# Patient Record
Sex: Male | Born: 1955
Health system: Southern US, Community
[De-identification: ages and names within clinical notes are randomized; demographics above are authoritative.]

## PROBLEM LIST (undated history)

## (undated) DIAGNOSIS — E119 Type 2 diabetes mellitus without complications: Secondary | ICD-10-CM

## (undated) DIAGNOSIS — I10 Essential (primary) hypertension: Secondary | ICD-10-CM

## (undated) DIAGNOSIS — I200 Unstable angina: Secondary | ICD-10-CM

## (undated) HISTORY — DX: Essential (primary) hypertension: I10

## (undated) HISTORY — DX: Unstable angina: I20.0

---

## 2012-10-08 ENCOUNTER — Encounter (HOSPITAL_BASED_OUTPATIENT_CLINIC_OR_DEPARTMENT_OTHER): Payer: Self-pay | Admitting: *Deleted

## 2012-10-08 ENCOUNTER — Emergency Department (HOSPITAL_BASED_OUTPATIENT_CLINIC_OR_DEPARTMENT_OTHER)
Admission: EM | Admit: 2012-10-08 | Discharge: 2012-10-08 | Disposition: A | Discharge status: Home / Self Care | Payer: Self-pay | Attending: Emergency Medicine | Admitting: Emergency Medicine

## 2012-10-08 DIAGNOSIS — R110 Nausea: Secondary | ICD-10-CM | POA: Insufficient documentation

## 2012-10-08 DIAGNOSIS — Z79899 Other long term (current) drug therapy: Secondary | ICD-10-CM | POA: Insufficient documentation

## 2012-10-08 DIAGNOSIS — IMO0002 Reserved for concepts with insufficient information to code with codable children: Secondary | ICD-10-CM

## 2012-10-08 DIAGNOSIS — E1065 Type 1 diabetes mellitus with hyperglycemia: Secondary | ICD-10-CM

## 2012-10-08 DIAGNOSIS — H9190 Unspecified hearing loss, unspecified ear: Secondary | ICD-10-CM | POA: Insufficient documentation

## 2012-10-08 DIAGNOSIS — H6093 Unspecified otitis externa, bilateral: Secondary | ICD-10-CM

## 2012-10-08 DIAGNOSIS — IMO0001 Reserved for inherently not codable concepts without codable children: Secondary | ICD-10-CM | POA: Insufficient documentation

## 2012-10-08 DIAGNOSIS — R51 Headache: Secondary | ICD-10-CM | POA: Insufficient documentation

## 2012-10-08 DIAGNOSIS — H60399 Other infective otitis externa, unspecified ear: Secondary | ICD-10-CM | POA: Insufficient documentation

## 2012-10-08 DIAGNOSIS — R42 Dizziness and giddiness: Secondary | ICD-10-CM | POA: Insufficient documentation

## 2012-10-08 DIAGNOSIS — F17200 Nicotine dependence, unspecified, uncomplicated: Secondary | ICD-10-CM | POA: Insufficient documentation

## 2012-10-08 LAB — BASIC METABOLIC PANEL
Calcium: 10.1 mg/dL (ref 8.4–10.5)
GFR calc non Af Amer: >90 mL/min (ref >90)
Glucose, Bld: 527 mg/dL — ABNORMAL HIGH (ref 70–99)
Sodium: 129 mEq/L — ABNORMAL LOW (ref 135–145)

## 2012-10-08 LAB — GLUCOSE, CAPILLARY

## 2012-10-08 MED ORDER — LEVOFLOXACIN 750 MG PO TABS: 750.0000 mg | ORAL_TABLET | Freq: Every day | ORAL | Status: DC

## 2012-10-08 MED ORDER — OXYCODONE-ACETAMINOPHEN 5-325 MG PO TABS
2.0000 | ORAL_TABLET | Freq: Once | ORAL | Status: AC
  Administered 2012-10-08: 2 via ORAL
  Filled 2012-10-08 (×2): qty 2

## 2012-10-08 MED ORDER — LEVOFLOXACIN IN D5W 750 MG/150ML IV SOLN
750.0000 mg | Freq: Once | INTRAVENOUS | Status: AC
  Administered 2012-10-08: 750 mg via INTRAVENOUS
  Filled 2012-10-08: qty 150

## 2012-10-08 MED ORDER — CIPROFLOXACIN-HYDROCORTISONE 0.2-1 % OT SUSP: 3.0000 [drp] | Freq: Two times a day (BID) | OTIC | Status: DC

## 2012-10-08 MED ORDER — METFORMIN HCL 500 MG PO TABS: 500.0000 mg | ORAL_TABLET | Freq: Two times a day (BID) | ORAL | Status: DC

## 2012-10-08 MED ORDER — INSULIN REGULAR HUMAN 100 UNIT/ML IJ SOLN
10.0000 [IU] | Freq: Once | INTRAMUSCULAR | Status: AC
  Administered 2012-10-08: 10 [IU] via SUBCUTANEOUS

## 2012-10-08 MED ORDER — INSULIN REGULAR HUMAN 100 UNIT/ML IJ SOLN
INTRAMUSCULAR | Status: AC
  Filled 2012-10-08: qty 1

## 2012-10-08 MED ORDER — OXYCODONE-ACETAMINOPHEN 5-325 MG PO TABS: 2.0000 | ORAL_TABLET | ORAL | Status: DC | PRN

## 2012-10-08 NOTE — ED Notes (Signed)
Was seen for ear pain last week in [place]. Was given abx drops, but there has been no improvement. States hearing loss in right ear last month and now lowered hearing loss in his left ear. Sore throat and dizziness. Patient talking louder than usual according to his wife.

## 2012-10-08 NOTE — ED Notes (Signed)
The patient's CBG was 496mg /dL

## 2012-10-08 NOTE — ED Provider Notes (Signed)
History    This chart was scribed for Rolan Bucco, MD, MD by Ashley Jacobs, ED Scribe. The patient was seen in room MH11/MH11 and the patient's care was started at 6:20 PM.   CSN: [id number] Arrival date & time 10/08/12  1658    Chief Complaint  Patient presents with  . Otalgia    The history is provided by the patient, medical records and the spouse. No language interpreter was used.   HPI Comments: Luis Larson is a 57 y.o. male who presents to the Emergency Department complaining moderate, constant ear pain bilaterally, with the left ear pain starting two months ago and right ear pain starting last week. Pt reports visiting a  ER due to left ear pain and was prescribed antibiotic drops (the drug did not relieve the pain). Pt is experiencing hearing loss, nausea, HA, pain on chewing Pt denies fever, drainage, rhinorrhea, chills, vomiting, diarrhea, weakness, cough, SOB and any other pain.  History reviewed. No pertinent past medical history. History reviewed. No pertinent past surgical history. No family history on file. History  Substance Use Topics  . Smoking status: Current Every Day Smoker  . Smokeless tobacco: Not on file  . Alcohol Use: No    Review of Systems  Constitutional: Negative for fever.  HENT: Positive for ear pain. Negative for congestion, rhinorrhea, sneezing and neck pain.   Eyes: Negative.   Respiratory: Negative.   Cardiovascular: Negative.   Gastrointestinal: Positive for nausea. Negative for vomiting, abdominal pain, diarrhea and blood in stool.  Genitourinary: Negative for frequency, hematuria, flank pain and difficulty urinating.  Musculoskeletal: Negative for back pain and arthralgias.  Skin: Negative for rash and wound.  Neurological: Positive for dizziness and headaches. Negative for speech difficulty and numbness.    Allergies  Review of patient's allergies indicates no known allergies.  Home Medications   Current Outpatient Rx  Name   Route  Sig  Dispense  Refill  . ciprofloxacin-dexamethasone (CIPRODEX) otic suspension   Both Ears   Place 4 drops into both ears 2 (two) times daily.         . ciprofloxacin-hydrocortisone (CIPRO HC) otic suspension   Both Ears   Place 3 drops into both ears 2 (two) times daily.   10 mL   0   . levofloxacin (LEVAQUIN) 750 MG tablet   Oral   Take 1 tablet (750 mg total) by mouth daily. X 7 days   7 tablet   0   . metFORMIN (GLUCOPHAGE) 500 MG tablet   Oral   Take 1 tablet (500 mg total) by mouth 2 (two) times daily with a meal.   60 tablet   0   . oxyCODONE-acetaminophen (PERCOCET) 5-325 MG per tablet   Oral   Take 2 tablets by mouth every 4 (four) hours as needed for pain.   20 tablet   0    BP 144/107  Pulse 112  Temp(Src) 98.7 F (37.1 C) (Oral)  Resp 20  SpO2 97% Physical Exam  Nursing note and vitals reviewed. Constitutional: He is oriented to person, place, and time. He appears well-developed and well-nourished.  HENT:  Head: Normocephalic and atraumatic.  Bilateral marked swelling of both ear canal White discharge from both ear canals Unable to visualize the TM No trismus  No significant pain over the mastoids  Eyes: Pupils are equal, round, and reactive to light.  Neck: Normal range of motion. Neck supple.  Cardiovascular: Normal rate, regular rhythm and normal heart sounds.  Pulmonary/Chest: Effort normal and breath sounds normal. No respiratory distress. He has no wheezes. He has no rales. He exhibits no tenderness.  Abdominal: Soft. Bowel sounds are normal. There is no tenderness. There is no rebound and no guarding.  Musculoskeletal: Normal range of motion. He exhibits no edema.  Lymphadenopathy:    He has no cervical adenopathy.  Neurological: He is alert and oriented to person, place, and time.  Skin: Skin is warm and dry. No rash noted.  Psychiatric: He has a normal mood and affect.    ED Course  Procedures (including critical care  time) DIAGNOSTIC STUDIES: Oxygen Saturation is 97% on roomair, normal by my interpretation.    COORDINATION OF CARE: 6:31 PM Discussed ED treatment with pt and pt agrees.   Results for orders placed during the hospital encounter of 10/08/12  BASIC METABOLIC PANEL      Result Value Range   Sodium 129 (*) 135 - 145 mEq/L   Potassium 4.2  3.5 - 5.1 mEq/L   Chloride 92 (*) 96 - 112 mEq/L   CO2 21  19 - 32 mEq/L   Glucose, Bld 527 (*) 70 - 99 mg/dL   BUN 13  6 - 23 mg/dL   Creatinine, Ser 1.61  0.50 - 1.35 mg/dL   Calcium 09.6  8.4 - 04.5 mg/dL   GFR calc non Af Amer >90  >90 mL/min   GFR calc Af Amer >90  >90 mL/min  GLUCOSE, CAPILLARY      Result Value Range   Glucose-Capillary 496 (*) 70 - 99 mg/dL   Comment 1 Notify RN     Comment 2 Documented in Chart     No results found.   No results found. 1. Otitis externa, bilateral   2. New onset type 1 diabetes mellitus, uncontrolled     MDM  Patient with new-onset diabetes and bilateral otitis externa. He was given a dose of subcutaneous insulin here in the ED. He was given dose of Levaquin in the ED. Ear wicks were placed in both of his years. His blood sugar is starting to come down. His creatinine is normal. He was started on metformin for new-onset diabetes. I advised him the importance of outpatient followup. He is currently living in [place] visiting here. I gave in the name of the community health and wellness Center to followup with if he remains in this area for more than a week or so. Otherwise he can followup with his primary care physician in [place]. I also stressed the importance of close ENT followup. He has swelling and infection of both of his ear canals. He was given a prescription for Cipro otic drops as well as oral Levaquin. He was given a prescription for Percocet for pain.  I personally performed the services described in this documentation, which was scribed in my presence.  The recorded information  has been reviewed and considered.    Rolan Bucco, MD 10/08/12 2134

## 2012-10-17 ENCOUNTER — Encounter: Payer: Self-pay | Admitting: Internal Medicine

## 2012-10-17 ENCOUNTER — Ambulatory Visit: Payer: Self-pay | Attending: Family Medicine | Admitting: Internal Medicine

## 2012-10-17 VITALS — BP 150/90 | HR 75 | Temp 98.9°F | Resp 18 | Ht 70.0 in | Wt 299.0 lb

## 2012-10-17 DIAGNOSIS — H6690 Otitis media, unspecified, unspecified ear: Secondary | ICD-10-CM

## 2012-10-17 DIAGNOSIS — IMO0001 Reserved for inherently not codable concepts without codable children: Secondary | ICD-10-CM

## 2012-10-17 DIAGNOSIS — E119 Type 2 diabetes mellitus without complications: Secondary | ICD-10-CM

## 2012-10-17 HISTORY — DX: Reserved for inherently not codable concepts without codable children: IMO0001

## 2012-10-17 LAB — LIPID PANEL
Cholesterol: 152 mg/dL (ref 0–200)
Total CHOL/HDL Ratio: 4.3 Ratio
Triglycerides: 188 mg/dL — ABNORMAL HIGH (ref <150)
VLDL: 38 mg/dL (ref 0–40)

## 2012-10-17 MED ORDER — METFORMIN HCL 500 MG PO TABS: 500.0000 mg | ORAL_TABLET | Freq: Two times a day (BID) | ORAL | Status: DC

## 2012-10-17 NOTE — Progress Notes (Signed)
Patient states he was in [facility] ED at 70 one week ago for ear ache; was told his blood sugar was 527 and that he needed to follow up here. States he is still having pain in ears.

## 2012-10-17 NOTE — Progress Notes (Signed)
Patient ID: Luis Larson, male   DOB: [birth year], 57 y.o.   MRN: [id number]  CC: New patient  HPI: 57 year old male with no significant past medical history, recent ear infection and new onset diabetes who comes in for followup. Patient reports feeling nausea occasionally in the morning. He has been checking his blood sugars every morning and usually it is around 300. He does admit to being noncompliant with dietary regimens and is has been difficult change for him knowing he has diabetes. Patient reports no blurry vision or headaches. No polydipsia or polyphagia.  No Known Allergies History reviewed. No pertinent past medical history. Current Outpatient Prescriptions on File Prior to Visit  Medication Sig Dispense Refill  . ciprofloxacin-hydrocortisone (CIPRO HC) otic suspension Place 3 drops into both ears 2 (two) times daily.  10 mL  0  . ciprofloxacin-dexamethasone (CIPRODEX) otic suspension Place 4 drops into both ears 2 (two) times daily.      Marland Kitchen levofloxacin (LEVAQUIN) 750 MG tablet Take 1 tablet (750 mg total) by mouth daily. X 7 days  7 tablet  0  . oxyCODONE-acetaminophen (PERCOCET) 5-325 MG per tablet Take 2 tablets by mouth every 4 (four) hours as needed for pain.  20 tablet  0   No current facility-administered medications on file prior to visit.   Family History  Problem Relation Age of Onset  . Diabetes Mother   . Heart disease Mother   . Heart disease Father    History   Social History  . Marital Status: Married    Spouse Name: N/A    Number of Children: N/A  . Years of Education: N/A   Occupational History  . Not on file.   Social History Main Topics  . Smoking status: Current Every Day Smoker  . Smokeless tobacco: Not on file  . Alcohol Use: No  . Drug Use: Not on file  . Sexually Active: Not on file   Other Topics Concern  . Not on file   Social History Narrative  . No narrative on file    Review of Systems  Constitutional: Negative for fever, chills,  diaphoresis, activity change, appetite change and fatigue.  HENT: Negative for ear pain, nosebleeds, congestion, facial swelling, rhinorrhea, neck pain, neck stiffness and ear discharge.   Eyes: Negative for pain, discharge, redness, itching and visual disturbance.  Respiratory: Negative for cough, choking, chest tightness, shortness of breath, wheezing and stridor.   Cardiovascular: Negative for chest pain, palpitations and leg swelling.  Gastrointestinal: Negative for abdominal distention.  Genitourinary: Negative for dysuria, urgency, frequency, hematuria, flank pain, decreased urine volume, difficulty urinating and dyspareunia.  Musculoskeletal: Negative for back pain, joint swelling, arthralgias and gait problem.  Neurological: Negative for dizziness, tremors, seizures, syncope, facial asymmetry, speech difficulty, weakness, light-headedness, numbness and headaches.  Hematological: Negative for adenopathy. Does not bruise/bleed easily.  Psychiatric/Behavioral: Negative for hallucinations, behavioral problems, confusion, dysphoric mood, decreased concentration and agitation.    Objective:   Filed Vitals:   10/17/12 1659  BP: 150/90  Pulse: 75  Temp: 98.9 F (37.2 C)  Resp: 18    Physical Exam  Constitutional: Appears well-developed and well-nourished. No distress.  HENT: Normocephalic. External right and left ear normal. Oropharynx is clear and moist.  Eyes: Conjunctivae and EOM are normal. PERRLA, no scleral icterus.  Neck: Normal ROM. Neck supple. No JVD. No tracheal deviation. No thyromegaly.  CVS: RRR, S1/S2 +, no murmurs, no gallops, no carotid bruit.  Pulmonary: Effort and breath sounds normal, no stridor,  rhonchi, wheezes, rales.  Abdominal: Soft. BS +,  no distension, tenderness, rebound or guarding.  Musculoskeletal: Normal range of motion. No edema and no tenderness.  Lymphadenopathy: No lymphadenopathy noted, cervical, inguinal. Neuro: Alert. Normal reflexes, muscle  tone coordination. No cranial nerve deficit. Skin: Skin is warm and dry. No rash noted. Not diaphoretic. No erythema. No pallor.  Psychiatric: Normal mood and affect. Behavior, judgment, thought content normal.   No results found for this basename: WBC, HGB, HCT, MCV, PLT   Lab Results  Component Value Date   CREATININE 0.80 10/08/2012   BUN 13 10/08/2012   NA 129* 10/08/2012   K 4.2 10/08/2012   CL 92* 10/08/2012   CO2 21 10/08/2012    No results found for this basename: HGBA1C   Lipid Panel  No results found for this basename: chol, trig, hdl, cholhdl, vldl, ldlcalc       Assessment and plan:   Patient Active Problem List   Diagnosis Date Noted  . Diabetes - check A1c today - Patient will move to [place] in 4 days. He will establish primary care physician there.  - Continue metformin 500 mg twice a day  10/17/2012  . Ear infection - stable 10/17/2012

## 2012-10-17 NOTE — Patient Instructions (Addendum)
Blood Sugar Monitoring, Adult  GLUCOSE METERS FOR SELF-MONITORING OF BLOOD GLUCOSE   It is important to be able to correctly measure your blood sugar (glucose). You can use a blood glucose monitor (a small battery-operated device) to check your glucose level at any time. This allows you and your caregiver to monitor your diabetes and to determine how well your treatment plan is working. The process of monitoring your blood glucose with a glucose meter is called self-monitoring of blood glucose (SMBG). When people with diabetes control their blood sugar, they have better health.  To test for glucose with a typical glucose meter, place the disposable strip in the meter. Then place a small sample of blood on the "test strip." The test strip is coated with chemicals that combine with glucose in blood. The meter measures how much glucose is present. The meter displays the glucose level as a number. Several new models can record and store a number of test results. Some models can connect to personal computers to store test results or print them out.   Newer meters are often easier to use than older models. Some meters allow you to get blood from places other than your fingertip. Some new models have automatic timing, error codes, signals, or barcode readers to help with proper adjustment (calibration). Some meters have a large display screen or spoken instructions for people with visual impairments.   INSTRUCTIONS FOR USING GLUCOSE METERS   Wash your hands with soap and warm water, or clean the area with alcohol. Dry your hands completely.   Prick the side of your fingertip with a lancet (a sharp-pointed tool used by hand).   Hold the hand down and gently milk the finger until a small drop of blood appears. Catch the blood with the test strip.    Follow the instructions for inserting the test strip and using the SMBG meter. Most meters require the meter to be turned on and the test strip to be inserted before applying the blood sample.   Record the test result.   Read the instructions carefully for both the meter and the test strips that go with it. Meter instructions are found in the user manual. Keep this manual to help you solve any problems that may arise. Many meters use "error codes" when there is a problem with the meter, the test strip, or the blood sample on the strip. You will need the manual to understand these error codes and fix the problem.   New devices are available such as laser lancets and meters that can test blood taken from "alternative sites" of the body, other than fingertips. However, you should use standard fingertip testing if your glucose changes rapidly. Also, use standard testing if:   You have eaten, exercised, or taken insulin in the past 2 hours.   You think your glucose is low.   You tend to not feel symptoms of low blood glucose (hypoglycemia).   You are ill or under stress.   Clean the meter as directed by the manufacturer.   Test the meter for accuracy as directed by the manufacturer.   Take your meter with you to your caregiver's office. This way, you can test your glucose in front of your caregiver to make sure you are using the meter correctly. Your caregiver can also take a sample of blood to test using a routine lab method. If values on the glucose meter are close to the lab results, you and your caregiver will see   that your meter is working well and you are using good technique. Your caregiver will advise you about what to do if the results do not match.  FREQUENCY OF TESTING    Your caregiver will tell you how often you should check your blood glucose. This will depend on your type of diabetes, your current level of diabetes control, and your types of medicines. The following are general guidelines, but your care plan may be different. Record all your readings and the time of day you took them for review with your caregiver.    Diabetes type 1.   When you are using insulin with good diabetic control (either multiple daily injections or via a pump), you should check your glucose 4 times a day.   If your diabetes is not well controlled, you may need to monitor more frequently, including before meals and 2 hours after meals, at bedtime, and occasionally between 2 a.m. and 3 a.m.   You should always check your glucose before a dose of insulin or before changing the rate on your insulin pump.   Diabetes type 2.   Guidelines for SMBG in diabetes type 2 are not as well defined.   If you are on insulin, follow the guidelines above.   If you are on medicines, but not insulin, and your glucose is not well controlled, you should test at least twice daily.   If you are not on insulin, and your diabetes is controlled with medicines or diet alone, you should test at least once daily, usually before breakfast.   A weekly profile will help your caregiver advise you on your care plan. The week before your visit, check your glucose before a meal and 2 hours after a meal at least daily. You may want to test before and after a different meal each day so you and your caregiver can tell how well controlled your blood sugars are throughout the course of a 24 hour period.   Gestational diabetes (diabetes during pregnancy).   Frequent testing is often necessary. Accurate timing is important.   If you are not on insulin, check your glucose 4 times a day. Check it before breakfast and 1 hour after the start of each meal.    If you are on insulin, check your glucose 6 times a day. Check it before each meal and 1 hour after the first bite of each meal.   General guidelines.   More frequent testing is required at the start of insulin treatment. Your caregiver will instruct you.   Test your glucose any time you suspect you have low blood sugar (hypoglycemia).   You should test more often when you change medicines, when you have unusual stress or illness, or in other unusual circumstances.  OTHER THINGS TO KNOW ABOUT GLUCOSE METERS   Measurement Range. Most glucose meters are able to read glucose levels over a broad range of values from as low as 0 to as high as 600 mg/dL. If you get an extremely high or low reading from your meter, you should first confirm it with another reading. Report very high or very low readings to your caregiver.   Whole Blood Glucose versus Plasma Glucose. Some older home glucose meters measure glucose in your whole blood. In a lab or when using some newer home glucose meters, the glucose is measured in your plasma (one component of blood). The difference can be important. It is important for you and your caregiver to know whether your meter   gives its results as "whole blood equivalent" or "plasma equivalent."   Display of High and Low Glucose Values. Part of learning how to operate a meter is understanding what the meter results mean. Know how high and low glucose concentrations are displayed on your meter.   Factors that Affect Glucose Meter Performance. The accuracy of your test results depends on many factors and varies depending on the brand and type of meter. These factors include:   Low red blood cell count (anemia).   Substances in your blood (such as uric acid, vitamin C, and others).   Environmental factors (temperature, humidity, altitude).   Name-brand versus generic test strips.    Calibration. Make sure your meter is set up properly. It is a good idea to do a calibration test with a control solution recommended by the manufacturer of your meter whenever you begin using a fresh bottle of test strips. This will help verify the accuracy of your meter.   Improperly stored, expired, or defective test strips. Keep your strips in a dry place with the lid on.   Soiled meter.   Inadequate blood sample.  NEW TECHNOLOGIES FOR GLUCOSE TESTING  Alternative site testing  Some glucose meters allow testing blood from alternative sites. These include the:   Upper arm.   Forearm.   Base of the thumb.   Thigh.  Sampling blood from alternative sites may be desirable. However, it may have some limitations. Blood in the fingertips show changes in glucose levels more quickly than blood in other parts of the body. This means that alternative site test results may be different from fingertip test results, not because of the meter's ability to test accurately, but because the actual glucose concentration can be different.   Continuous Glucose Monitoring  Devices to measure your blood glucose continuously are available, and others are in development. These methods can be more expensive than self-monitoring with a glucose meter. However, it is uncertain how effective and reliable these devices are. Your caregiver will advise you if this approach makes sense for you.  IF BLOOD SUGARS ARE CONTROLLED, PEOPLE WITH DIABETES REMAIN HEALTHIER.   SMBG is an important part of the treatment plan of patients with diabetes mellitus. Below are reasons for using SMBG:    It confirms that your glucose is at a specific, healthy level.   It detects hypoglycemia and severe hyperglycemia.   It allows you and your caregiver to make adjustments in response to changes in lifestyle for individuals requiring medicine.    It determines the need for starting insulin therapy in temporary diabetes that happens during pregnancy (gestational diabetes).  Document Released: 04/01/2003 Document Revised: 06/21/2011 Document Reviewed: 07/23/2010  [employer] Patient Information 2014 [employer].

## 2012-10-25 ENCOUNTER — Telehealth: Payer: Self-pay | Admitting: Family Medicine

## 2012-10-25 NOTE — Telephone Encounter (Signed)
Pt A1C is 12.4; pt needs explanation of lipid panel and would like a call back

## 2012-10-26 ENCOUNTER — Telehealth: Payer: Self-pay | Admitting: Family Medicine

## 2012-10-26 NOTE — Telephone Encounter (Signed)
Pt's sister calling about lab results, due to high A1C. Pt's sister spoke with Angelique Blonder.

## 2012-10-27 ENCOUNTER — Ambulatory Visit: Payer: Self-pay | Attending: Family Medicine | Admitting: Family Medicine

## 2012-10-27 VITALS — BP 146/89 | HR 63 | Temp 98.5°F | Resp 17 | Wt 297.2 lb

## 2012-10-27 DIAGNOSIS — IMO0001 Reserved for inherently not codable concepts without codable children: Secondary | ICD-10-CM

## 2012-10-27 MED ORDER — LISINOPRIL-HYDROCHLOROTHIAZIDE 10-12.5 MG PO TABS: 1.0000 | ORAL_TABLET | Freq: Every day | ORAL | Status: DC

## 2012-10-27 MED ORDER — METFORMIN HCL ER 500 MG PO TB24: 1000.0000 mg | ORAL_TABLET | Freq: Two times a day (BID) | ORAL | Status: DC

## 2012-10-27 NOTE — Patient Instructions (Addendum)
Diabetes and Exercise Regular exercise is important and can help:   Control blood glucose (sugar).  Decrease blood pressure.    Control blood lipids (cholesterol, triglycerides).  Improve overall health. BENEFITS FROM EXERCISE  Improved fitness.  Improved flexibility.  Improved endurance.  Increased bone density.  Weight control.  Increased muscle strength.  Decreased body fat.  Improvement of the body's use of insulin, a hormone.  Increased insulin sensitivity.  Reduction of insulin needs.  Reduced stress and tension.  Helps you feel better. People with diabetes who add exercise to their lifestyle gain additional benefits, including:  Weight loss.  Reduced appetite.  Improvement of the body's use of blood glucose.  Decreased risk factors for heart disease:  Lowering of cholesterol and triglycerides.  Raising the level of good cholesterol (high-density lipoproteins, HDL).  Lowering blood sugar.  Decreased blood pressure. TYPE 1 DIABETES AND EXERCISE  Exercise will usually lower your blood glucose.  If blood glucose is greater than 240 mg/dl, check urine ketones. If ketones are present, do not exercise.  Location of the insulin injection sites may need to be adjusted with exercise. Avoid injecting insulin into areas of the body that will be exercised. For example, avoid injecting insulin into:  The arms when playing tennis.  The legs when jogging. For more information, discuss this with your caregiver.  Keep a record of:  Food intake.  Type and amount of exercise.  Expected peak times of insulin action.  Blood glucose levels. Do this before, during, and after exercise. Review your records with your caregiver. This will help you to develop guidelines for adjusting food intake and insulin amounts.  TYPE 2 DIABETES AND EXERCISE  Regular physical activity can help control blood glucose.  Exercise is important because it may:  Increase the  body's sensitivity to insulin.  Improve blood glucose control.  Exercise reduces the risk of heart disease. It decreases serum cholesterol and triglycerides. It also lowers blood pressure.  Those who take insulin or oral hypoglycemic agents should watch for signs of hypoglycemia. These signs include dizziness, shaking, sweating, chills, and confusion.  Body water is lost during exercise. It must be replaced. This will help to avoid loss of body fluids (dehydration) or heat stroke. Be sure to talk to your caregiver before starting an exercise program to make sure it is safe for you. Remember, any activity is better than none.  Document Released: 06/19/2003 Document Revised: 06/21/2011 Document Reviewed: 10/03/2008 [facility] Patient Information 2014 [place], [place]. Diabetes and Foot Care Diabetes may cause you to have a poor blood supply (circulation) to your legs and feet. Because of this, the skin may be thinner, break easier, and heal more slowly. You also may have nerve damage in your legs and feet causing decreased feeling. You may not notice minor injuries to your feet that could lead to serious problems or infections. Taking care of your feet is one of the most important things you can do for yourself.  HOME CARE INSTRUCTIONS  Do not go barefoot. Bare feet are easily injured.  Check your feet daily for blisters, cuts, and redness.  Wash your feet with warm water (not hot) and mild soap. Pat your feet and between your toes until completely dry.  Apply a moisturizing lotion that does not contain alcohol or petroleum jelly to the dry skin on your feet and to dry brittle toenails. Do not put it between your toes.  Trim your toenails straight across. Do not dig under them or around  the cuticle.  Do not cut corns or calluses, or try to remove them with medicine.  Wear clean cotton socks or stockings every day. Make sure they are not too tight. Do not wear knee high stockings since they may  decrease blood flow to your legs.  Wear leather shoes that fit properly and have enough cushioning. To break in new shoes, wear them just a few hours a day to avoid injuring your feet.  Wear shoes at all times, even in the house.  Do not cross your legs. This may decrease the blood flow to your feet.  If you find a minor scrape, cut, or break in the skin on your feet, keep it and the skin around it clean and dry. These areas may be cleansed with mild soap and water. Do not use peroxide, alcohol, iodine or Merthiolate.  When you remove an adhesive bandage, be sure not to harm the skin around it.  If you have a wound, look at it several times a day to make sure it is healing.  Do not use heating pads or hot water bottles. Burns can occur. If you have lost feeling in your feet or legs, you may not know it is happening until it is too late.  Report any cuts, sores or bruises to your caregiver. Do not wait! SEEK MEDICAL CARE IF:   You have an injury that is not healing or you notice redness, numbness, burning, or tingling.  Your feet always feel cold.  You have pain or cramps in your legs and feet. SEEK IMMEDIATE MEDICAL CARE IF:   There is increasing redness, swelling, or increasing pain in the wound.  There is a red line that goes up your leg.  Pus is coming from a wound.  You develop an unexplained oral temperature above 102 F (38.9 C), or as your caregiver suggests.  You notice a bad smell coming from an ulcer or wound. MAKE SURE YOU:   Understand these instructions.  Will watch your condition.  Will get help right away if you are not doing well or get worse. Document Released: 03/26/2000 Document Revised: 06/21/2011 Document Reviewed: 10/02/2008 [employer] Patient Information 2014 [place], [place]. 1800 Calorie Diet for Diabetes Meal Planning The 1800 calorie diet is designed for eating up to 1800 calories each day. Following this diet and making healthy meal choices can  help improve overall health. This diet controls blood sugar (glucose) levels and can also help lower blood pressure and cholesterol. SERVING SIZES Measuring foods and serving sizes helps to make sure you are getting the right amount of food. The list below tells how big or small some common serving sizes are:  1 oz.........4 stacked dice.  3 oz........Marland KitchenDeck of cards.  1 tsp.......Marland KitchenTip of little finger.  1 tbs......Marland KitchenMarland KitchenThumb.  2 tbs.......Marland KitchenGolf ball.   cup......Marland KitchenHalf of a fist.  1 cup.......Marland KitchenA fist. GUIDELINES FOR CHOOSING FOODS The goal of this diet is to eat a variety of foods and limit calories to 1800 each day. This can be done by choosing foods that are low in calories and fat. The diet also suggests eating small amounts of food frequently. Doing this helps control your blood glucose levels so they do not get too high or too low. Each meal or snack may include a protein food source to help you feel more satisfied and to stabilize your blood glucose. Try to eat about the same amount of food around the same time each day. This includes weekend days, travel days,  and days off work. Space your meals about 4 to 5 hours apart and add a snack between them if you wish.  For example, a daily food plan could include breakfast, a morning snack, lunch, dinner, and an evening snack. Healthy meals and snacks include whole grains, vegetables, fruits, lean meats, poultry, fish, and dairy products. As you plan your meals, select a variety of foods. Choose from the bread and starch, vegetable, fruit, dairy, and meat/protein groups. Examples of foods from each group and their suggested serving sizes are listed below. Use measuring cups and spoons to become familiar with what a healthy portion looks like. Bread and Starch Each serving equals 15 grams of carbohydrates.  1 slice bread.   bagel.   cup cold cereal (unsweetened).   cup hot cereal or mashed potatoes.  1 small potato (size of a computer  mouse).   cup cooked pasta or rice.   English muffin.  1 cup broth-based soup.  3 cups of popcorn.  4 to 6 whole-wheat crackers.   cup cooked beans, peas, or corn. Vegetable Each serving equals 5 grams of carbohydrates.   cup cooked vegetables.  1 cup raw vegetables.   cup tomato or vegetable juice. Fruit Each serving equals 15 grams of carbohydrates.  1 small apple or orange.  1 cup watermelon or strawberries.   cup applesauce (no sugar added).  2 tbs raisins.   banana.   cup canned fruit, packed in water, its own juice, or sweetened with a sugar substitute.   cup unsweetened fruit juice. Dairy Each serving equals 12 to 15 grams of carbohydrates.  1 cup fat-free milk.  6 oz artificially sweetened yogurt or plain yogurt.  1 cup low-fat buttermilk.  1 cup soy milk.  1 cup almond milk. Meat/Protein  1 large egg.  2 to 3 oz meat, poultry, or fish.   cup low-fat cottage cheese.  1 tbs peanut butter.  1 oz low-fat cheese.   cup tuna in water.   cup tofu. Fat  1 tsp oil.  1 tsp trans-fat-free margarine.  1 tsp butter.  1 tsp mayonnaise.  2 tbs avocado.  1 tbs salad dressing.  1 tbs cream cheese.  2 tbs sour cream. SAMPLE 1800 CALORIE DIET PLAN Breakfast   cup unsweetened cereal (1 carb serving).  1 cup fat-free milk (1 carb serving).  1 slice whole-wheat toast (1 carb serving).   small banana (1 carb serving).  1 scrambled egg.  1 tsp trans-fat-free margarine. Lunch  Tuna sandwich.  2 slices whole-wheat bread (2 carb servings).   cup canned tuna in water, drained.  1 tbs reduced fat mayonnaise.  1 stalk celery, chopped.  2 slices tomato.  1 lettuce leaf.  1 cup carrot sticks.  24 to 30 seedless grapes (2 carb servings).  6 oz light yogurt (1 carb serving). Afternoon Snack  3 graham cracker squares (1 carb serving).  Fat-free milk, 1 cup (1 carb serving).  1 tbs peanut  butter. Dinner  3 oz salmon, broiled with 1 tsp oil.  1 cup mashed potatoes (2 carb servings) with 1 tsp trans-fat-free margarine.  1 cup fresh or frozen green beans.  1 cup steamed asparagus.  1 cup fat-free milk (1 carb serving). Evening Snack  3 cups air-popped popcorn (1 carb serving).  2 tbs parmesan cheese sprinkled on top. MEAL PLAN Use this worksheet to help you make a daily meal plan based on the 1800 calorie diet suggestions. If you are using this  plan to help you control your blood glucose, you may interchange carbohydrate-containing foods (dairy, starches, and fruits). Select a variety of fresh foods of varying colors and flavors. The total amount of carbohydrate in your meals or snacks is more important than making sure you include all of the food groups every time you eat. Choose from the following foods to build your day's meals:  8 Starches.  4 Vegetables.  3 Fruits.  2 Dairy.  6 to 7 oz Meat/Protein.  Up to 4 Fats. Your dietician can use this worksheet to help you decide how many servings and which types of foods are right for you. BREAKFAST Food Group and Servings / Food Choice Starch ________________________________________________________ Dairy _________________________________________________________ Fruit _________________________________________________________ Meat/Protein __________________________________________________ Fat ___________________________________________________________ LUNCH Food Group and Servings / Food Choice Starch ________________________________________________________ Meat/Protein __________________________________________________ Vegetable _____________________________________________________ Fruit _________________________________________________________ Dairy _________________________________________________________ Fat ___________________________________________________________ Luis Larson Food Group and Servings  / Food Choice Starch ________________________________________________________ Meat/Protein __________________________________________________ Fruit __________________________________________________________ Dairy _________________________________________________________ Luis Larson Food Group and Servings / Food Choice Starch _________________________________________________________ Meat/Protein ___________________________________________________ Dairy __________________________________________________________ Vegetable ______________________________________________________ Fruit ___________________________________________________________ Fat ____________________________________________________________ Luis Larson Food Group and Servings / Food Choice Fruit __________________________________________________________ Meat/Protein ___________________________________________________ Dairy __________________________________________________________ Starch _________________________________________________________ DAILY TOTALS Starch ____________________________ Vegetable _________________________ Fruit _____________________________ Dairy _____________________________ Meat/Protein______________________ Fat _______________________________ Document Released: 10/19/2004 Document Revised: 06/21/2011 Document Reviewed: 02/12/2011 [employer] Patient Information 2014 [place].  Smoking Cessation, Tips for Success YOU CAN QUIT SMOKING If you are ready to quit smoking, congratulations! You have chosen to help yourself be healthier. Cigarettes bring nicotine, tar, carbon monoxide, and other irritants into your body. Your lungs, heart, and blood vessels will be able to work better without these poisons. There are many different ways to quit smoking. Nicotine gum, nicotine patches, a nicotine inhaler, or nicotine nasal spray can help with physical craving. Hypnosis, support groups, and medicines help  break the habit of smoking. Here are some tips to help you quit for good.  Throw away all cigarettes.  Clean and remove all ashtrays from your home, work, and car.  On a card, write down your reasons for quitting. Carry the card with you and read it when you get the urge to smoke.  Cleanse your body of nicotine. Drink enough water and fluids to keep your urine clear or pale yellow. Do this after quitting to flush the nicotine from your body.  Learn to predict your moods. Do not let a bad situation be your excuse to have a cigarette. Some situations in your life might tempt you into wanting a cigarette.  Never have "just one" cigarette. It leads to wanting another and another. Remind yourself of your decision to quit.  Change habits associated with smoking. If you smoked while driving or when feeling stressed, try other activities to replace smoking. Stand up when drinking your coffee. Brush your teeth after eating. Sit in a different chair when you read the paper. Avoid alcohol while trying to quit, and try to drink fewer caffeinated beverages. Alcohol and caffeine may urge you to smoke.  Avoid foods and drinks that can trigger a desire to smoke, such as sugary or spicy foods and alcohol.  Ask people who smoke not to smoke around you.  Have something planned to do right after eating or having a cup of coffee. Take a walk or exercise to perk you up. This will help to keep you from overeating.  Try a relaxation exercise to calm you down and decrease your stress. Remember, you may be tense and nervous for the  first 2 weeks after you quit, but this will pass.  Find new activities to keep your hands busy. Play with a pen, coin, or rubber band. Doodle or draw things on paper.  Brush your teeth right after eating. This will help cut down on the craving for the taste of tobacco after meals. You can try mouthwash, too.  Use oral substitutes, such as lemon drops, carrots, a cinnamon stick, or  chewing gum, in place of cigarettes. Keep them handy so they are available when you have the urge to smoke.  When you have the urge to smoke, try deep breathing.  Designate your home as a nonsmoking area.  If you are a heavy smoker, ask your caregiver about a prescription for nicotine chewing gum. It can ease your withdrawal from nicotine.  Reward yourself. Set aside the cigarette money you save and buy yourself something nice.  Look for support from others. Join a support group or smoking cessation program. Ask someone at home or at work to help you with your plan to quit smoking.  Always ask yourself, "Do I need this cigarette or is this just a reflex?" Tell yourself, "Today, I choose not to smoke," or "I do not want to smoke." You are reminding yourself of your decision to quit, even if you do smoke a cigarette. HOW WILL I FEEL WHEN I QUIT SMOKING?  The benefits of not smoking start within days of quitting.  You may have symptoms of withdrawal because your body is used to nicotine (the addictive substance in cigarettes). You may crave cigarettes, be irritable, feel very hungry, cough often, get headaches, or have difficulty concentrating.  The withdrawal symptoms are only temporary. They are strongest when you first quit but will go away within 10 to 14 days.  When withdrawal symptoms occur, stay in control. Think about your reasons for quitting. Remind yourself that these are signs that your body is healing and getting used to being without cigarettes.  Remember that withdrawal symptoms are easier to treat than the major diseases that smoking can cause.  Even after the withdrawal is over, expect periodic urges to smoke. However, these cravings are generally short-lived and will go away whether you smoke or not. Do not smoke!  If you relapse and smoke again, do not lose hope. Most smokers quit 3 times before they are successful.  If you relapse, do not give up! Plan ahead and think  about what you will do the next time you get the urge to smoke. LIFE AS A NONSMOKER: MAKE IT FOR A MONTH, MAKE IT FOR LIFE Day 1: Hang this page where you will see it every day. Day 2: Get rid of all ashtrays, matches, and lighters. Day 3: Drink water. Breathe deeply between sips. Day 4: Avoid places with smoke-filled air, such as bars, clubs, or the smoking section of restaurants. Day 5: Keep track of how much money you save by not smoking. Day 6: Avoid boredom. Keep a good book with you or go to the movies. Day 7: Reward yourself! One week without smoking! Day 8: Make a dental appointment to get your teeth cleaned. Day 9: Decide how you will turn down a cigarette before it is offered to you. Day 10: Review your reasons for quitting. Day 11: Distract yourself. Stay active to keep your mind off smoking and to relieve tension. Take a walk, exercise, read a book, do a crossword puzzle, or try a new hobby. Day 12: Exercise. Get off the  bus before your stop or use stairs instead of escalators. Day 13: Call on friends for support and encouragement. Day 14: Reward yourself! Two weeks without smoking! Day 15: Practice deep breathing exercises. Day 16: Bet a friend that you can stay a nonsmoker. Day 17: Ask to sit in nonsmoking sections of restaurants. Day 18: Hang up "No Smoking" signs. Day 19: Think of yourself as a nonsmoker. Day 20: Each morning, tell yourself you will not smoke. Day 21: Reward yourself! Three weeks without smoking! Day 22: Think of smoking in negative ways. Remember how it stains your teeth, gives you bad breath, and leaves you short of breath. Day 23: Eat a nutritious breakfast. Day 24:Do not relive your days as a smoker. Day 25: Hold a pencil in your hand when talking on the telephone. Day 26: Tell all your friends you do not smoke. Day 27: Think about how much better food tastes. Day 28: Remember, one cigarette is one too many. Day 29: Take up a hobby that will keep  your hands busy. Day 30: Congratulations! One month without smoking! Give yourself a big reward. Your caregiver can direct you to community resources or hospitals for support, which may include:  Group support.  Education.  Hypnosis.  Subliminal therapy. Document Released: 12/26/2003 Document Revised: 06/21/2011 Document Reviewed: 01/13/2009 [facility] Patient Information 2014 [place], [place]. Smoking Cessation Quitting smoking is important to your health and has many advantages. However, it is not always easy to quit since nicotine is a very addictive drug. Often times, people try 3 times or more before being able to quit. This document explains the best ways for you to prepare to quit smoking. Quitting takes hard work and a lot of effort, but you can do it. ADVANTAGES OF QUITTING SMOKING  You will live longer, feel better, and live better.  Your body will feel the impact of quitting smoking almost immediately.  Within 20 minutes, blood pressure decreases. Your pulse returns to its normal level.  After 8 hours, carbon monoxide levels in the blood return to normal. Your oxygen level increases.  After 24 hours, the chance of having a heart attack starts to decrease. Your breath, hair, and body stop smelling like smoke.  After 48 hours, damaged nerve endings begin to recover. Your sense of taste and smell improve.  After 72 hours, the body is virtually free of nicotine. Your bronchial tubes relax and breathing becomes easier.  After 2 to 12 weeks, lungs can hold more air. Exercise becomes easier and circulation improves.  The risk of having a heart attack, stroke, cancer, or lung disease is greatly reduced.  After 1 year, the risk of coronary heart disease is cut in half.  After 5 years, the risk of stroke falls to the same as a nonsmoker.  After 10 years, the risk of lung cancer is cut in half and the risk of other cancers decreases significantly.  After 15 years, the risk of  coronary heart disease drops, usually to the level of a nonsmoker.  If you are pregnant, quitting smoking will improve your chances of having a healthy baby.  The people you live with, especially any children, will be healthier.  You will have extra money to spend on things other than cigarettes. QUESTIONS TO THINK ABOUT BEFORE ATTEMPTING TO QUIT You may want to talk about your answers with your caregiver.  Why do you want to quit?  If you tried to quit in the past, what helped and what did not?  What will be the most difficult situations for you after you quit? How will you plan to handle them?  Who can help you through the tough times? Your family? Friends? A caregiver?  What pleasures do you get from smoking? What ways can you still get pleasure if you quit? Here are some questions to ask your caregiver:  How can you help me to be successful at quitting?  What medicine do you think would be best for me and how should I take it?  What should I do if I need more help?  What is smoking withdrawal like? How can I get information on withdrawal? GET READY  Set a quit date.  Change your environment by getting rid of all cigarettes, ashtrays, matches, and lighters in your home, car, or work. Do not let people smoke in your home.  Review your past attempts to quit. Think about what worked and what did not. GET SUPPORT AND ENCOURAGEMENT You have a better chance of being successful if you have help. You can get support in many ways.  Tell your family, friends, and co-workers that you are going to quit and need their support. Ask them not to smoke around you.  Get individual, group, or telephone counseling and support. Programs are available at [employer] and health centers. Call your local health department for information about programs in your area.  Spiritual beliefs and practices may help some smokers quit.  Download a "quit meter" on your computer to keep track of quit  statistics, such as how long you have gone without smoking, cigarettes not smoked, and money saved.  Get a self-help book about quitting smoking and staying off of tobacco. LEARN NEW SKILLS AND BEHAVIORS  Distract yourself from urges to smoke. Talk to someone, go for a walk, or occupy your time with a task.  Change your normal routine. Take a different route to work. Drink tea instead of coffee. Eat breakfast in a different place.  Reduce your stress. Take a hot bath, exercise, or read a book.  Plan something enjoyable to do every day. Reward yourself for not smoking.  Explore interactive web-based programs that specialize in helping you quit. GET MEDICINE AND USE IT CORRECTLY Medicines can help you stop smoking and decrease the urge to smoke. Combining medicine with the above behavioral methods and support can greatly increase your chances of successfully quitting smoking.  Nicotine replacement therapy helps deliver nicotine to your body without the negative effects and risks of smoking. Nicotine replacement therapy includes nicotine gum, lozenges, inhalers, nasal sprays, and skin patches. Some may be available over-the-counter and others require a prescription.  Antidepressant medicine helps people abstain from smoking, but how this works is unknown. This medicine is available by prescription.  Nicotinic receptor partial agonist medicine simulates the effect of nicotine in your brain. This medicine is available by prescription. Ask your caregiver for advice about which medicines to use and how to use them based on your health history. Your caregiver will tell you what side effects to look out for if you choose to be on a medicine or therapy. Carefully read the information on the package. Do not use any other product containing nicotine while using a nicotine replacement product.  RELAPSE OR DIFFICULT SITUATIONS Most relapses occur within the first 3 months after quitting. Do not be  discouraged if you start smoking again. Remember, most people try several times before finally quitting. You may have symptoms of withdrawal because your body is used  to nicotine. You may crave cigarettes, be irritable, feel very hungry, cough often, get headaches, or have difficulty concentrating. The withdrawal symptoms are only temporary. They are strongest when you first quit, but they will go away within 10 14 days. To reduce the chances of relapse, try to:  Avoid drinking alcohol. Drinking lowers your chances of successfully quitting.  Reduce the amount of caffeine you consume. Once you quit smoking, the amount of caffeine in your body increases and can give you symptoms, such as a rapid heartbeat, sweating, and anxiety.  Avoid smokers because they can make you want to smoke.  Do not let weight gain distract you. Many smokers will gain weight when they quit, usually less than 10 pounds. Eat a healthy diet and stay active. You can always lose the weight gained after you quit.  Find ways to improve your mood other than smoking. FOR MORE INFORMATION  www.smokefree.gov  Document Released: 03/23/2001 Document Revised: 09/28/2011 Document Reviewed: 07/08/2011 [facility] Patient Information 2014 [place], [place].

## 2012-10-27 NOTE — Progress Notes (Signed)
Patient here for follow up on his DM 

## 2012-10-27 NOTE — Progress Notes (Signed)
Patient ID: Luis Larson, male   DOB: 09/19/1955, 57 y.o.   MRN: [id number]  CC:  Chief Complaint  Patient presents with  . Follow-up   HPI: Pt is presenting to follow up for HTN and DM.  Pt reports doing well.  BS have been coming down but still in 200s.    No Known Allergies History reviewed. No pertinent past medical history. Current Outpatient Prescriptions on File Prior to Visit  Medication Sig Dispense Refill  . ciprofloxacin-dexamethasone (CIPRODEX) otic suspension Place 4 drops into both ears 2 (two) times daily.      . ciprofloxacin-hydrocortisone (CIPRO HC) otic suspension Place 3 drops into both ears 2 (two) times daily.  10 mL  0  . levofloxacin (LEVAQUIN) 750 MG tablet Take 1 tablet (750 mg total) by mouth daily. X 7 days  7 tablet  0  . oxyCODONE-acetaminophen (PERCOCET) 5-325 MG per tablet Take 2 tablets by mouth every 4 (four) hours as needed for pain.  20 tablet  0   No current facility-administered medications on file prior to visit.   Family History  Problem Relation Age of Onset  . Diabetes Mother   . Heart disease Mother   . Heart disease Father    History   Social History  . Marital Status: Married    Spouse Name: N/A    Number of Children: N/A  . Years of Education: N/A   Occupational History  . Not on file.   Social History Main Topics  . Smoking status: Current Every Day Smoker  . Smokeless tobacco: Not on file  . Alcohol Use: No  . Drug Use: Not on file  . Sexually Active: Not on file   Other Topics Concern  . Not on file   Social History Narrative  . No narrative on file    Review of Systems  Constitutional: Negative for fever, chills, diaphoresis, activity change, appetite change and fatigue.  HENT: Negative for ear pain, nosebleeds, congestion, facial swelling, rhinorrhea, neck pain, neck stiffness and ear discharge.   Eyes: Negative for pain, discharge, redness, itching and visual disturbance.  Respiratory: Negative for cough,  choking, chest tightness, shortness of breath, wheezing and stridor.   Cardiovascular: Negative for chest pain, palpitations and leg swelling.  Gastrointestinal: Negative for abdominal distention.  Genitourinary: Negative for dysuria, urgency, frequency, hematuria, flank pain, decreased urine volume, difficulty urinating and dyspareunia.  Musculoskeletal: Negative for back pain, joint swelling, arthralgias and gait problem.  Neurological: Negative for dizziness, tremors, seizures, syncope, facial asymmetry, speech difficulty, weakness, light-headedness, numbness and headaches.  Hematological: Negative for adenopathy. Does not bruise/bleed easily.  Psychiatric/Behavioral: Negative for hallucinations, behavioral problems, confusion, dysphoric mood, decreased concentration and agitation.    Objective:   Filed Vitals:   10/27/12 1141  BP: 146/89  Pulse: 63  Temp: 98.5 F (36.9 C)  Resp: 17    Physical Exam  Constitutional: Appears well-developed and well-nourished. No distress.  HENT: Normocephalic. External right and left ear normal. Oropharynx is clear and moist.  Eyes: Conjunctivae and EOM are normal. PERRLA, no scleral icterus.  Neck: Normal ROM. Neck supple. No JVD. No tracheal deviation. No thyromegaly.  CVS: RRR, S1/S2 +, no murmurs, no gallops, no carotid bruit.  Pulmonary: Effort and breath sounds normal, no stridor, rhonchi, wheezes, rales.  Abdominal: Soft. BS +,  no distension, tenderness, rebound or guarding.  Musculoskeletal: Normal range of motion. No edema and no tenderness.  Lymphadenopathy: No lymphadenopathy noted, cervical, inguinal. Neuro: Alert. Normal reflexes, muscle tone  coordination. No cranial nerve deficit. Skin: Skin is warm and dry. No rash noted. Not diaphoretic. No erythema. No pallor.  Psychiatric: Normal mood and affect. Behavior, judgment, thought content normal.   No results found for this basename: WBC, HGB, HCT, MCV, PLT   Lab Results  Component  Value Date   CREATININE 0.80 10/08/2012   BUN 13 10/08/2012   NA 129* 10/08/2012   K 4.2 10/08/2012   CL 92* 10/08/2012   CO2 21 10/08/2012    Lab Results  Component Value Date   HGBA1C 12.4 10/17/2012   Lipid Panel     Component Value Date/Time   CHOL 152 10/17/2012 1803   TRIG 188* 10/17/2012 1803   HDL 35* 10/17/2012 1803   CHOLHDL 4.3 10/17/2012 1803   VLDL 38 10/17/2012 1803   LDLCALC 79 10/17/2012 1803       Assessment and plan:   Patient Active Problem List   Diagnosis Date Noted  . Diabetes 10/17/2012  . Ear infection 10/17/2012   Increase metformin to Glucophage Xr 500 mg - take 2 tabs po BID with meals Add lisinopril HCT 10/12.5 - take 1 po daily  Reviewed labs with patient today.  RTC in 6 weeks  Check BS 2 times per day  Refer for Retasure Exam at family medicine  Pt to go to optometrist for eye exam  The patient was given clear instructions to go to ER or return to medical center if symptoms don't improve, worsen or new problems develop.  The patient verbalized understanding.  The patient was told to call to get any lab results if not heard anything in the next week.    Rodney Langton, MD, CDE, FAAFP [facility] [place], [place]

## 2012-10-31 ENCOUNTER — Ambulatory Visit: Payer: Self-pay | Admitting: Home Health Services

## 2012-11-02 ENCOUNTER — Telehealth: Payer: Self-pay | Admitting: *Deleted

## 2012-11-02 NOTE — Telephone Encounter (Signed)
11/02/12 Unable to reach patient with the number provided. P.Palin Tristan,RN BSN MHA

## 2012-11-03 ENCOUNTER — Encounter: Payer: Self-pay | Admitting: Home Health Services

## 2012-11-06 ENCOUNTER — Ambulatory Visit: Payer: Self-pay | Admitting: Home Health Services

## 2012-11-22 ENCOUNTER — Ambulatory Visit (INDEPENDENT_AMBULATORY_CARE_PROVIDER_SITE_OTHER): Payer: Self-pay | Admitting: Home Health Services

## 2012-11-22 NOTE — Progress Notes (Signed)
DIABETES Pt came in to have a retinal scan per diabetic care.   Image was taken and submitted to [facility]arg for reading.    Results will be available in 1-2 weeks.  Results will be given to PCP for review and to contact patient.  Luis Larson  

## 2012-12-22 ENCOUNTER — Encounter: Payer: Self-pay | Admitting: Internal Medicine

## 2012-12-22 ENCOUNTER — Ambulatory Visit: Payer: Self-pay | Attending: Family Medicine | Admitting: Internal Medicine

## 2012-12-22 VITALS — BP 136/84 | HR 81 | Temp 98.5°F | Resp 16 | Ht 70.08 in | Wt 290.0 lb

## 2012-12-22 DIAGNOSIS — IMO0001 Reserved for inherently not codable concepts without codable children: Secondary | ICD-10-CM

## 2012-12-22 DIAGNOSIS — Z79899 Other long term (current) drug therapy: Secondary | ICD-10-CM | POA: Insufficient documentation

## 2012-12-22 DIAGNOSIS — E119 Type 2 diabetes mellitus without complications: Secondary | ICD-10-CM

## 2012-12-22 DIAGNOSIS — R1011 Right upper quadrant pain: Secondary | ICD-10-CM

## 2012-12-22 DIAGNOSIS — R059 Cough, unspecified: Secondary | ICD-10-CM

## 2012-12-22 DIAGNOSIS — I10 Essential (primary) hypertension: Secondary | ICD-10-CM | POA: Insufficient documentation

## 2012-12-22 DIAGNOSIS — R05 Cough: Secondary | ICD-10-CM

## 2012-12-22 HISTORY — DX: Essential (primary) hypertension: I10

## 2012-12-22 HISTORY — DX: Type 2 diabetes mellitus without complications: E11.9

## 2012-12-22 LAB — CMP AND LIVER
AST: 13 U/L (ref 0–37)
Alkaline Phosphatase: 79 U/L (ref 39–117)
BUN: 13 mg/dL (ref 6–23)
Creat: 0.81 mg/dL (ref 0.50–1.35)

## 2012-12-22 MED ORDER — LISINOPRIL-HYDROCHLOROTHIAZIDE 10-12.5 MG PO TABS: 1.0000 | ORAL_TABLET | Freq: Every day | ORAL | Status: DC

## 2012-12-22 MED ORDER — METFORMIN HCL ER 500 MG PO TB24: 1000.0000 mg | ORAL_TABLET | Freq: Two times a day (BID) | ORAL | Status: DC

## 2012-12-22 NOTE — Progress Notes (Signed)
Pt is here for a F/U visit. Pt reports that his right side of his stomach is in severe pain. The pain started last week. 8 on pain scale, sharp and really hurts when he coughs/ sneezes. He has nausea, dry heaves and he cant sleep on his left side or lying flat on his back.

## 2012-12-22 NOTE — Progress Notes (Signed)
Patient ID: Luis Larson, male   DOB: July 21, 1955, 57 y.o.   MRN: [id number] Patient Demographics  Luis Larson, is a 57 y.o. male  [name]  [name]  DOB - [birth year]  Chief Complaint  Patient presents with  . Follow-up        Subjective:   Luis Larson is a 57 y.o. male here today for a follow up visit. His major complaint today include a right upper quadrant abdominal pain that started about 5 days ago, nonradiating, about 5/10 worrisome, worse on deep breath, coughing, yawning, not related to meal. No nausea, vomiting or diarrhea. No change in bowel habit, no urinary symptoms. Denies chest pain, denies chronic cough. Patient continues to smoke at least one pack per day, denies use of alcohol.  Patient has No headache, No chest pain, No new weakness tingling or numbness, No Cough - SOB.  ALLERGIES:  No Known Allergies  PAST MEDICAL HISTORY: Past Medical History  Diagnosis Date  . Essential hypertension, benign 12/22/2012    MEDICATIONS AT HOME: Prior to Admission medications   Medication Sig Start Date End Date Taking? Authorizing Provider  ciprofloxacin-dexamethasone (CIPRODEX) otic suspension Place 4 drops into both ears 2 (two) times daily.   Yes Historical Provider, MD  ciprofloxacin-hydrocortisone (CIPRO HC) otic suspension Place 3 drops into both ears 2 (two) times daily. 10/08/12  Yes Rolan Bucco, MD  metFORMIN (GLUCOPHAGE XR) 500 MG 24 hr tablet Take 2 tablets (1,000 mg total) by mouth 2 (two) times daily with a meal. 12/22/12  Yes Jeanann Lewandowsky, MD  levofloxacin (LEVAQUIN) 750 MG tablet Take 1 tablet (750 mg total) by mouth daily. X 7 days 10/08/12   Rolan Bucco, MD  lisinopril-hydrochlorothiazide (PRINZIDE,ZESTORETIC) 10-12.5 MG per tablet Take 1 tablet by mouth daily. 12/22/12   Jeanann Lewandowsky, MD  oxyCODONE-acetaminophen (PERCOCET) 5-325 MG per tablet Take 2 tablets by mouth every 4 (four) hours as needed for pain. 10/08/12   Rolan Bucco, MD      Objective:   Filed Vitals:   12/22/12 1417  BP: 136/84  Pulse: 81  Temp: 98.5 F (36.9 C)  TempSrc: Oral  Resp: 16  Height: 5' 10.08" (1.78 m)  Weight: 290 lb (131.543 kg)  SpO2: 94%    Exam General appearance :Awake, alert, not in any distress. Speech Clear. Not toxic Looking. Obese HEENT: Atraumatic and Normocephalic, pupils equally reactive to light and accomodation Neck: supple, no JVD. No cervical lymphadenopathy.  Chest:Good air entry bilaterally, no added sounds  CVS: S1 S2 regular, no murmurs.  Abdomen: Bowel sounds present, mild right upper quadrant tenderness but he not distended with no gaurding, rigidity or rebound. Extremities: B/L Lower Ext shows no edema, both legs are warm to touch Neurology: Awake alert, and oriented X 3, CN II-XII intact, Non focal Skin:No Rash Wounds:N/A   Data Review   CBC No results found for this basename: WBC, HGB, HCT, PLT, MCV, MCH, MCHC, RDW, NEUTRABS, LYMPHSABS, MONOABS, EOSABS, BASOSABS, BANDABS, BANDSABD,  in the last 168 hours  Chemistries   No results found for this basename: NA, K, CL, CO2, GLUCOSE, BUN, CREATININE, GFRCGP, CALCIUM, MG, AST, ALT, ALKPHOS, BILITOT,  in the last 168 hours ------------------------------------------------------------------------------------------------------------------ No results found for this basename: HGBA1C,  in the last 72 hours ------------------------------------------------------------------------------------------------------------------ No results found for this basename: CHOL, HDL, LDLCALC, TRIG, CHOLHDL, LDLDIRECT,  in the last 72 hours ------------------------------------------------------------------------------------------------------------------ No results found for this basename: TSH, T4TOTAL, FREET3, T3FREE, THYROIDAB,  in the last 72 hours ------------------------------------------------------------------------------------------------------------------ No results found  for this basename: VITAMINB12, FOLATE, FERRITIN, TIBC, IRON, RETICCTPCT,  in the last 72 hours  Coagulation profile  No results found for this basename: INR, PROTIME,  in the last 168 hours    Assessment & Plan   Patient Active Problem List   Diagnosis Date Noted  . Diabetes 12/22/2012  . Abdominal pain, right upper quadrant 12/22/2012  . Essential hypertension, benign 12/22/2012  . Cough 12/22/2012  . Type II or unspecified type diabetes mellitus without mention of complication, uncontrolled 10/17/2012  . Ear infection 10/17/2012     Plan: Comprehensive metabolic panel Chest x-ray Abdominal ultrasound  Refill medications Glucophage XR 500 mg tablet by mouth twice a day Lisinopril-hydrochlorothiazide 10-12.5 mg tablet one tablet by mouth daily Patient extensively counseled about nutrition and exercise We'll call patient with the results of investigations   Follow up in 2 months   The patient was given clear instructions to go to ER or return to medical center if symptoms don't improve, worsen or new problems develop. The patient verbalized understanding. The patient was told to call to get lab results if they haven't heard anything in the next week.    Jeanann Lewandowsky, MD, MHA, FACP [facility] [place], [place] [phone]   12/22/2012, 2:51 PM

## 2012-12-27 ENCOUNTER — Ambulatory Visit (HOSPITAL_COMMUNITY)
Admission: RE | Admit: 2012-12-27 | Discharge: 2012-12-27 | Disposition: A | Discharge status: Home / Self Care | Payer: Self-pay | Source: Ambulatory Visit | Attending: Internal Medicine | Admitting: Internal Medicine

## 2012-12-27 DIAGNOSIS — R05 Cough: Secondary | ICD-10-CM

## 2012-12-27 DIAGNOSIS — R1011 Right upper quadrant pain: Secondary | ICD-10-CM | POA: Insufficient documentation

## 2012-12-27 DIAGNOSIS — K8020 Calculus of gallbladder without cholecystitis without obstruction: Secondary | ICD-10-CM | POA: Insufficient documentation

## 2012-12-28 ENCOUNTER — Telehealth: Payer: Self-pay | Admitting: Emergency Medicine

## 2012-12-28 NOTE — Telephone Encounter (Signed)
Message copied by Darlis Loan on Thu Dec 28, 2012  2:40 PM ------      Message from: Jeanann Lewandowsky E      Created: Thu Dec 28, 2012 10:27 AM       The abdominal ultrasound shows gallstone. If symptoms of abdominal pain persists, we will refer patient to a general surgeon. His chest X ray shows clear lungs. Please call to inform patient of assessment and recommendation. ------

## 2012-12-28 NOTE — Telephone Encounter (Signed)
Attempted to call with lab results. Will try again

## 2012-12-29 ENCOUNTER — Telehealth: Payer: Self-pay | Admitting: Emergency Medicine

## 2012-12-29 NOTE — Telephone Encounter (Signed)
PT GIVEN LAB RESULTS 

## 2012-12-29 NOTE — Telephone Encounter (Signed)
PT REQUESTING BLOOD WORK RESULTS. NOT READ PER PROVIDER BUT INFORMED NEGATIVE

## 2013-02-15 ENCOUNTER — Other Ambulatory Visit: Payer: Self-pay

## 2013-02-21 ENCOUNTER — Encounter: Payer: Self-pay | Admitting: Internal Medicine

## 2013-02-21 ENCOUNTER — Ambulatory Visit: Payer: Self-pay | Attending: Internal Medicine | Admitting: Internal Medicine

## 2013-02-21 VITALS — BP 123/83 | HR 79 | Temp 98.3°F | Resp 16 | Ht 71.0 in | Wt 296.0 lb

## 2013-02-21 DIAGNOSIS — I10 Essential (primary) hypertension: Secondary | ICD-10-CM | POA: Insufficient documentation

## 2013-02-21 DIAGNOSIS — E119 Type 2 diabetes mellitus without complications: Secondary | ICD-10-CM | POA: Insufficient documentation

## 2013-02-21 DIAGNOSIS — Z23 Encounter for immunization: Secondary | ICD-10-CM

## 2013-02-21 DIAGNOSIS — K8020 Calculus of gallbladder without cholecystitis without obstruction: Secondary | ICD-10-CM

## 2013-02-21 LAB — POCT GLYCOSYLATED HEMOGLOBIN (HGB A1C): Hemoglobin A1C: 6.1

## 2013-02-21 NOTE — Progress Notes (Signed)
Patient Demographics  Luis Larson, is a 57 y.o. male  [id number]  [id number]  DOB - [birth year]  Chief Complaint  Patient presents with  . Follow-up        Subjective:   Luis Larson today is here for a follow up visit. Patient is a 57 year old white male with a past medical history of hypertension, diabetes, ongoing tobacco use who presents for a followup visit. Currently has no complaints. He did have right upper quadrant abdominal pain during his last visit that has almost resolved, he still gets mild intermittent pain occasionally. A. abdominal ultrasound done a few months ago shows a 1.5 cm gallstone in the neck of the gallbladder. She currently has no other complaints   Patient has No headache, No chest pain, No abdominal pain - No Nausea, No new weakness tingling or numbness, No Cough - SOB.  Objective:    Filed Vitals:   02/21/13 1420  BP: 123/83  Pulse: 79  Temp: 98.3 F (36.8 C)  TempSrc: Oral  Resp: 16  Height: 5\' 11"  (1.803 m)  Weight: 296 lb (134.265 kg)  SpO2: 93%     ALLERGIES:  No Known Allergies  PAST MEDICAL HISTORY: Past Medical History  Diagnosis Date  . Essential hypertension, benign 12/22/2012    MEDICATIONS AT HOME: Prior to Admission medications   Medication Sig Start Date End Date Taking? Authorizing Provider  lisinopril-hydrochlorothiazide (PRINZIDE,ZESTORETIC) 10-12.5 MG per tablet Take 1 tablet by mouth daily. 12/22/12  Yes Jeanann Lewandowsky, MD  metFORMIN (GLUCOPHAGE XR) 500 MG 24 hr tablet Take 2 tablets (1,000 mg total) by mouth 2 (two) times daily with a meal. 12/22/12  Yes Jeanann Lewandowsky, MD  ciprofloxacin-dexamethasone (CIPRODEX) otic suspension Place 4 drops into both ears 2 (two) times daily.    Historical Provider, MD  ciprofloxacin-hydrocortisone (CIPRO HC) otic suspension Place 3 drops into both ears 2 (two) times daily. 10/08/12   Rolan Bucco, MD  levofloxacin (LEVAQUIN) 750 MG tablet Take 1 tablet (750 mg total) by  mouth daily. X 7 days 10/08/12   Rolan Bucco, MD  oxyCODONE-acetaminophen (PERCOCET) 5-325 MG per tablet Take 2 tablets by mouth every 4 (four) hours as needed for pain. 10/08/12   Rolan Bucco, MD     Exam  General appearance :Awake, alert, not in any distress. Speech Clear. Not toxic Looking HEENT: Atraumatic and Normocephalic, pupils equally reactive to light and accomodation Neck: supple, no JVD. No cervical lymphadenopathy.  Chest:Good air entry bilaterally, no added sounds  CVS: S1 S2 regular, no murmurs.  Abdomen: Bowel sounds present, Non tender and not distended with no gaurding, rigidity or rebound. Extremities: B/L Lower Ext shows no edema, both legs are warm to touch Neurology: Awake alert, and oriented X 3, CN II-XII intact, Non focal Skin:No Rash Wounds:N/A    Data Review   CBC No results found for this basename: WBC, HGB, HCT, PLT, MCV, MCH, MCHC, RDW, NEUTRABS, LYMPHSABS, MONOABS, EOSABS, BASOSABS, BANDABS, BANDSABD,  in the last 168 hours  Chemistries   No results found for this basename: NA, K, CL, CO2, GLUCOSE, BUN, CREATININE, GFRCGP, CALCIUM, MG, AST, ALT, ALKPHOS, BILITOT,  in the last 168 hours ------------------------------------------------------------------------------------------------------------------  Recent Labs  02/21/13 1425  HGBA1C 6.1   ------------------------------------------------------------------------------------------------------------------ No results found for this basename: CHOL, HDL, LDLCALC, TRIG, CHOLHDL, LDLDIRECT,  in the last 72 hours ------------------------------------------------------------------------------------------------------------------ No results found for this basename: TSH, T4TOTAL, FREET3, T3FREE, THYROIDAB,  in the last 72 hours ------------------------------------------------------------------------------------------------------------------ No results found for this basename: VITAMINB12, FOLATE,  FERRITIN,  TIBC, IRON, RETICCTPCT,  in the last 72 hours  Coagulation profile  No results found for this basename: INR, PROTIME,  in the last 168 hours    Assessment & Plan   Hypertension - Controlled, continue lisinopril/HCTZ combination  Diabetes - Check A1c today-if significantly elevated will add glipizide or will talk to patient about insulin.  Addendum - A1c is 6.1-continue metformin  Right upper quadrant abdominal pain - secondary to symptomatic cholelithiasis - Will refer to Gen. Surgery- for consideration of cholecystectomy   Health Maintenance -Colonoscopy:will refer to GI -Influenza vaccination-order today  Follow up in 2 months   The patient was given clear instructions to go to ER or return to medical center if symptoms don't improve, worsen or new problems develop. The patient verbalized understanding. The patient was told to call to get lab results if they haven't heard anything in the next week.

## 2013-02-21 NOTE — Progress Notes (Signed)
Pt is here following up on his diabetes. Pt has no C.C. Today. 

## 2013-04-18 ENCOUNTER — Encounter: Payer: Self-pay | Admitting: Internal Medicine

## 2013-06-05 ENCOUNTER — Ambulatory Visit: Payer: Self-pay

## 2013-06-06 ENCOUNTER — Encounter: Payer: Self-pay | Admitting: Internal Medicine

## 2013-06-06 ENCOUNTER — Ambulatory Visit: Payer: Self-pay | Attending: Internal Medicine | Admitting: Internal Medicine

## 2013-06-06 VITALS — BP 138/86 | HR 67 | Temp 98.9°F | Resp 14 | Ht 71.0 in | Wt 294.2 lb

## 2013-06-06 DIAGNOSIS — I10 Essential (primary) hypertension: Secondary | ICD-10-CM

## 2013-06-06 DIAGNOSIS — K8020 Calculus of gallbladder without cholecystitis without obstruction: Secondary | ICD-10-CM | POA: Insufficient documentation

## 2013-06-06 DIAGNOSIS — IMO0001 Reserved for inherently not codable concepts without codable children: Secondary | ICD-10-CM

## 2013-06-06 DIAGNOSIS — E1165 Type 2 diabetes mellitus with hyperglycemia: Secondary | ICD-10-CM

## 2013-06-06 DIAGNOSIS — E119 Type 2 diabetes mellitus without complications: Secondary | ICD-10-CM

## 2013-06-06 DIAGNOSIS — F17200 Nicotine dependence, unspecified, uncomplicated: Secondary | ICD-10-CM | POA: Insufficient documentation

## 2013-06-06 LAB — POCT GLYCOSYLATED HEMOGLOBIN (HGB A1C): Hemoglobin A1C: 6

## 2013-06-06 LAB — COMPLETE METABOLIC PANEL WITH GFR
ALT: 30 U/L (ref 0–53)
AST: 20 U/L (ref 0–37)
Albumin: 4.6 g/dL (ref 3.5–5.2)
Alkaline Phosphatase: 77 U/L (ref 39–117)
BILIRUBIN TOTAL: 0.5 mg/dL (ref 0.2–1.2)
BUN: 14 mg/dL (ref 6–23)
CO2: 27 meq/L (ref 19–32)
Calcium: 9.7 mg/dL (ref 8.4–10.5)
Chloride: 100 mEq/L (ref 96–112)
Creat: 0.89 mg/dL (ref 0.50–1.35)
GFR, Est Non African American: >89 mL/min
Glucose, Bld: 113 mg/dL — ABNORMAL HIGH (ref 70–99)
POTASSIUM: 4.4 meq/L (ref 3.5–5.3)
Sodium: 135 mEq/L (ref 135–145)
TOTAL PROTEIN: 7.4 g/dL (ref 6.0–8.3)

## 2013-06-06 LAB — LIPID PANEL
Cholesterol: 148 mg/dL (ref 0–200)
HDL: 36 mg/dL — AB (ref >39)
LDL CALC: 90 mg/dL (ref 0–99)
Total CHOL/HDL Ratio: 4.1 Ratio
Triglycerides: 110 mg/dL (ref <150)
VLDL: 22 mg/dL (ref 0–40)

## 2013-06-06 LAB — CBC WITH DIFFERENTIAL/PLATELET
BASOS ABS: 0 10*3/uL (ref 0.0–0.1)
BASOS PCT: 0 % (ref 0–1)
EOS PCT: 2 % (ref 0–5)
Eosinophils Absolute: 0.2 10*3/uL (ref 0.0–0.7)
HEMATOCRIT: 48 % (ref 39.0–52.0)
HEMOGLOBIN: 16.6 g/dL (ref 13.0–17.0)
Lymphocytes Relative: 30 % (ref 12–46)
Lymphs Abs: 2.4 10*3/uL (ref 0.7–4.0)
MCH: 29.1 pg (ref 26.0–34.0)
MCHC: 34.6 g/dL (ref 30.0–36.0)
MCV: 84.1 fL (ref 78.0–100.0)
MONO ABS: 0.6 10*3/uL (ref 0.1–1.0)
MONOS PCT: 7 % (ref 3–12)
NEUTROS ABS: 4.8 10*3/uL (ref 1.7–7.7)
Neutrophils Relative %: 61 % (ref 43–77)
Platelets: 255 10*3/uL (ref 150–400)
RBC: 5.71 MIL/uL (ref 4.22–5.81)
RDW: 15.1 % (ref 11.5–15.5)
WBC: 7.9 10*3/uL (ref 4.0–10.5)

## 2013-06-06 LAB — GLUCOSE, POCT (MANUAL RESULT ENTRY): POC Glucose: 110 mg/dl — AB (ref 70–99)

## 2013-06-06 LAB — TSH: TSH: 1.523 u[IU]/mL (ref 0.350–4.500)

## 2013-06-06 MED ORDER — METFORMIN HCL ER 500 MG PO TB24: 1000.0000 mg | ORAL_TABLET | Freq: Two times a day (BID) | ORAL | Status: DC

## 2013-06-06 MED ORDER — FREESTYLE SYSTEM KIT: 1.0000 | PACK | Status: DC | PRN

## 2013-06-06 MED ORDER — GLUCOSE BLOOD VI STRP: ORAL_STRIP | Status: DC

## 2013-06-06 MED ORDER — LISINOPRIL-HYDROCHLOROTHIAZIDE 10-12.5 MG PO TABS: 1.0000 | ORAL_TABLET | Freq: Every day | ORAL | Status: DC

## 2013-06-06 NOTE — Progress Notes (Signed)
Patient ID: Luis Larson, male   DOB: 01/02/1956, 58 y.o.   MRN: [id number]   CC:  HPI: 58 year-old male with history of hypertension, diabetes, both of which appear to be stable, presents today to get his refills. Last A1c was 6.1, diabetes appears to be well-controlled, he checks his CBG once a week. CBC stable at less than 200. No numbness or tingling in his feet. He had an ophthalmology exam and September 2014. The patient also is a 1.5 cm gallstone in the gallbladder neck based on an ultrasound last year. He apparently got call from general surgery saying that it is going to cost him a lot of money to have surgery. He still has not received a referral for routine colonoscopy    No Known Allergies Past Medical History  Diagnosis Date  . Essential hypertension, benign 12/22/2012   Current Outpatient Prescriptions on File Prior to Visit  Medication Sig Dispense Refill  . ciprofloxacin-dexamethasone (CIPRODEX) otic suspension Place 4 drops into both ears 2 (two) times daily.      . ciprofloxacin-hydrocortisone (CIPRO HC) otic suspension Place 3 drops into both ears 2 (two) times daily.  10 mL  0  . levofloxacin (LEVAQUIN) 750 MG tablet Take 1 tablet (750 mg total) by mouth daily. X 7 days  7 tablet  0  . oxyCODONE-acetaminophen (PERCOCET) 5-325 MG per tablet Take 2 tablets by mouth every 4 (four) hours as needed for pain.  20 tablet  0   No current facility-administered medications on file prior to visit.   Family History  Problem Relation Age of Onset  . Diabetes Mother   . Heart disease Mother   . Heart disease Father    History   Social History  . Marital Status: Married    Spouse Name: N/A    Number of Children: N/A  . Years of Education: N/A   Occupational History  . Not on file.   Social History Main Topics  . Smoking status: Current Every Day Smoker  . Smokeless tobacco: Not on file  . Alcohol Use: No  . Drug Use: Not on file  . Sexual Activity: Not on file    Other Topics Concern  . Not on file   Social History Narrative  . No narrative on file    Review of Systems  Constitutional: Negative for fever, chills, diaphoresis, activity change, appetite change and fatigue.  HENT: Negative for ear pain, nosebleeds, congestion, facial swelling, rhinorrhea, neck pain, neck stiffness and ear discharge.   Eyes: Negative for pain, discharge, redness, itching and visual disturbance.  Respiratory: Negative for cough, choking, chest tightness, shortness of breath, wheezing and stridor.   Cardiovascular: Negative for chest pain, palpitations and leg swelling.  Gastrointestinal: Negative for abdominal distention.  Genitourinary: Negative for dysuria, urgency, frequency, hematuria, flank pain, decreased urine volume, difficulty urinating and dyspareunia.  Musculoskeletal: Negative for back pain, joint swelling, arthralgias and gait problem.  Neurological: Negative for dizziness, tremors, seizures, syncope, facial asymmetry, speech difficulty, weakness, light-headedness, numbness and headaches.  Hematological: Negative for adenopathy. Does not bruise/bleed easily.  Psychiatric/Behavioral: Negative for hallucinations, behavioral problems, confusion, dysphoric mood, decreased concentration and agitation.    Objective:   Filed Vitals:   06/06/13 1138  BP: 138/86  Pulse: 67  Temp: 98.9 F (37.2 C)  Resp: 14    Physical Exam  Constitutional: Appears well-developed and well-nourished. No distress.  HENT: Normocephalic. External right and left ear normal. Oropharynx is clear and moist.  Eyes: Conjunctivae and  EOM are normal. PERRLA, no scleral icterus.  Neck: Normal ROM. Neck supple. No JVD. No tracheal deviation. No thyromegaly.  CVS: RRR, S1/S2 +, no murmurs, no gallops, no carotid bruit.  Pulmonary: Effort and breath sounds normal, no stridor, rhonchi, wheezes, rales.  Abdominal: Soft. BS +,  no distension, tenderness, rebound or guarding.   Musculoskeletal: Normal range of motion. No edema and no tenderness.  Lymphadenopathy: No lymphadenopathy noted, cervical, inguinal. Neuro: Alert. Normal reflexes, muscle tone coordination. No cranial nerve deficit. Skin: Skin is warm and dry. No rash noted. Not diaphoretic. No erythema. No pallor.  Psychiatric: Normal mood and affect. Behavior, judgment, thought content normal.   No results found for this basename: WBC, HGB, HCT, MCV, PLT   Lab Results  Component Value Date   CREATININE 0.81 12/22/2012   BUN 13 12/22/2012   NA 135 12/22/2012   K 4.1 12/22/2012   CL 101 12/22/2012   CO2 27 12/22/2012    Lab Results  Component Value Date   HGBA1C 6.1 02/21/2013   Lipid Panel     Component Value Date/Time   CHOL 152 10/17/2012 1803   TRIG 188* 10/17/2012 1803   HDL 35* 10/17/2012 1803   CHOLHDL 4.3 10/17/2012 1803   VLDL 38 10/17/2012 1803   LDLCALC 79 10/17/2012 1803       Assessment and plan:   Patient Active Problem List   Diagnosis Date Noted  . Diabetes 12/22/2012  . Abdominal pain, right upper quadrant 12/22/2012  . Essential hypertension, benign 12/22/2012  . Cough 12/22/2012  . Type II or unspecified type diabetes mellitus without mention of complication, uncontrolled 10/17/2012  . Ear infection 10/17/2012   Diabetes mellitus A1c pending Continue metformin Patient carefully monitors his diet Refills provided for all his medications including glucometer and lancets Lipid panel Ophthalmology referral for retinal scan on his next visit    Hypertension controlled Refill lisinopril/HCTZ Renal panel      Gallstone Patient counseled about symptoms of acute cholecystitis, instructed to the ER if he develops this  Followup in 4 months  The patient was given clear instructions to go to ER or return to medical center if symptoms don't improve, worsen or new problems develop. The patient verbalized understanding. The patient was told to call to get any lab results if not  heard anything in the next week.

## 2013-06-06 NOTE — Progress Notes (Signed)
Patient is here for a medication refill and diabetes. Last A1c was very controlled. Needs refills on all medications.

## 2013-06-12 ENCOUNTER — Telehealth: Payer: Self-pay | Admitting: *Deleted

## 2013-06-12 NOTE — Telephone Encounter (Signed)
Message copied by Alannie Amodio, [place] R on Tue Jun 12, 2013  9:39 AM ------      Message from: Susie CassetteABROL MD, Germain OsgoodNAYANA      Created: Mon Jun 11, 2013  1:40 PM       Notify patient of the labs are normal ------

## 2013-06-12 NOTE — Telephone Encounter (Signed)
Left a voicemail for patient to return our call. 

## 2013-06-14 ENCOUNTER — Ambulatory Visit: Payer: Self-pay

## 2013-06-21 ENCOUNTER — Ambulatory Visit: Payer: Self-pay

## 2013-10-04 ENCOUNTER — Ambulatory Visit: Payer: Self-pay | Attending: Internal Medicine | Admitting: Internal Medicine

## 2013-10-04 ENCOUNTER — Encounter: Payer: Self-pay | Admitting: Internal Medicine

## 2013-10-04 VITALS — BP 145/85 | HR 68 | Temp 97.9°F | Resp 18 | Ht 71.0 in | Wt 291.0 lb

## 2013-10-04 DIAGNOSIS — N529 Male erectile dysfunction, unspecified: Secondary | ICD-10-CM

## 2013-10-04 DIAGNOSIS — N528 Other male erectile dysfunction: Secondary | ICD-10-CM

## 2013-10-04 DIAGNOSIS — E1310 Other specified diabetes mellitus with ketoacidosis without coma: Secondary | ICD-10-CM

## 2013-10-04 DIAGNOSIS — I10 Essential (primary) hypertension: Secondary | ICD-10-CM

## 2013-10-04 DIAGNOSIS — E1110 Type 2 diabetes mellitus with ketoacidosis without coma: Secondary | ICD-10-CM | POA: Insufficient documentation

## 2013-10-04 DIAGNOSIS — IMO0001 Reserved for inherently not codable concepts without codable children: Secondary | ICD-10-CM

## 2013-10-04 DIAGNOSIS — Z1211 Encounter for screening for malignant neoplasm of colon: Secondary | ICD-10-CM

## 2013-10-04 DIAGNOSIS — E1165 Type 2 diabetes mellitus with hyperglycemia: Secondary | ICD-10-CM

## 2013-10-04 HISTORY — DX: Other male erectile dysfunction: N52.8

## 2013-10-04 HISTORY — DX: Encounter for screening for malignant neoplasm of colon: Z12.11

## 2013-10-04 LAB — POCT GLYCOSYLATED HEMOGLOBIN (HGB A1C): Hemoglobin A1C: 5.9

## 2013-10-04 LAB — GLUCOSE, POCT (MANUAL RESULT ENTRY): POC GLUCOSE: 100 mg/dL — AB (ref 70–99)

## 2013-10-04 MED ORDER — LISINOPRIL-HYDROCHLOROTHIAZIDE 10-12.5 MG PO TABS: 1.0000 | ORAL_TABLET | Freq: Every day | ORAL | Status: DC

## 2013-10-04 MED ORDER — SILDENAFIL CITRATE 50 MG PO TABS: 50.0000 mg | ORAL_TABLET | Freq: Every day | ORAL | Status: DC | PRN

## 2013-10-04 MED ORDER — METFORMIN HCL ER 500 MG PO TB24: 1000.0000 mg | ORAL_TABLET | Freq: Two times a day (BID) | ORAL | Status: DC

## 2013-10-04 NOTE — Progress Notes (Signed)
Pt here for diabetes and hypertension. States he is taking prescribed medications daily with exercise as well as eating healthier. States he is down from waist line 48 to 42. Refused smoking cessation today and advanced directive Need Colonoscopy referral Requesting Viagra for ED Denies chest pain,numbness or urine problems A1c,CBg running

## 2013-10-04 NOTE — Patient Instructions (Signed)
Diabetes and Exercise Exercising regularly is important. It is not just about losing weight. It has many health benefits, such as:  Improving your overall fitness, flexibility, and endurance.  Increasing your bone density.  Helping with weight control.  Decreasing your body fat.  Increasing your muscle strength.  Reducing stress and tension.  Improving your overall health. People with diabetes who exercise gain additional benefits because exercise:  Reduces appetite.  Improves the body's use of blood sugar (glucose).  Helps lower or control blood glucose.  Decreases blood pressure.  Helps control blood lipids (such as cholesterol and triglycerides).  Improves the body's use of the hormone insulin by:  Increasing the body's insulin sensitivity.  Reducing the body's insulin needs.  Decreases the risk for heart disease because exercising:  Lowers cholesterol and triglycerides levels.  Increases the levels of good cholesterol (such as high-density lipoproteins [HDL]) in the body.  Lowers blood glucose levels. YOUR ACTIVITY PLAN  Choose an activity that you enjoy and set realistic goals. Your health care provider or diabetes educator can help you make an activity plan that works for you. You can break activities into 2 or 3 sessions throughout the day. Doing so is as good as one long session. Exercise ideas include:  Taking the dog for a walk.  Taking the stairs instead of the elevator.  Dancing to your favorite song.  Doing your favorite exercise with a friend. RECOMMENDATIONS FOR EXERCISING WITH TYPE 1 OR TYPE 2 DIABETES   Check your blood glucose before exercising. If blood glucose levels are greater than 240 mg/dL, check for urine ketones. Do not exercise if ketones are present.  Avoid injecting insulin into areas of the body that are going to be exercised. For example, avoid injecting insulin into:  The arms when playing tennis.  The legs when  jogging.  Keep a record of:  Food intake before and after you exercise.  Expected peak times of insulin action.  Blood glucose levels before and after you exercise.  The type and amount of exercise you have done.  Review your records with your health care provider. Your health care provider will help you to develop guidelines for adjusting food intake and insulin amounts before and after exercising.  If you take insulin or oral hypoglycemic agents, watch for signs and symptoms of hypoglycemia. They include:  Dizziness.  Shaking.  Sweating.  Chills.  Confusion.  Drink plenty of water while you exercise to prevent dehydration or heat stroke. Body water is lost during exercise and must be replaced.  Talk to your health care provider before starting an exercise program to make sure it is safe for you. Remember, almost any type of activity is better than none. Document Released: 06/19/2003 Document Revised: 11/29/2012 Document Reviewed: 09/05/2012 [facility] Patient Information 2015 [place], [place]. This information is not intended to replace advice given to you by your health care provider. Make sure you discuss any questions you have with your health care provider. Hypertension Hypertension, commonly called high blood pressure, is when the force of blood pumping through your arteries is too strong. Your arteries are the blood vessels that carry blood from your heart throughout your body. A blood pressure reading consists of a higher number over a lower number, such as 110/72. The higher number (systolic) is the pressure inside your arteries when your heart pumps. The lower number (diastolic) is the pressure inside your arteries when your heart relaxes. Ideally you want your blood pressure below 120/80. Hypertension forces your  heart to work harder to pump blood. Your arteries may become narrow or stiff. Having hypertension puts you at risk for heart disease, stroke, and other problems.   RISK FACTORS Some risk factors for high blood pressure are controllable. Others are not.  Risk factors you cannot control include:   Race. You may be at higher risk if you are African American.  Age. Risk increases with age.  Gender. Men are at higher risk than women before age 645 years. After age 56, women are at higher risk than men. Risk factors you can control include:  Not getting enough exercise or physical activity.  Being overweight.  Getting too much fat, sugar, calories, or salt in your diet.  Drinking too much alcohol. SIGNS AND SYMPTOMS Hypertension does not usually cause signs or symptoms. Extremely high blood pressure (hypertensive crisis) may cause headache, anxiety, shortness of breath, and nosebleed. DIAGNOSIS  To check if you have hypertension, your health care provider will measure your blood pressure while you are seated, with your arm held at the level of your heart. It should be measured at least twice using the same arm. Certain conditions can cause a difference in blood pressure between your right and left arms. A blood pressure reading that is higher than normal on one occasion does not mean that you need treatment. If one blood pressure reading is high, ask your health care provider about having it checked again. TREATMENT  Treating high blood pressure includes making lifestyle changes and possibly taking medication. Living a healthy lifestyle can help lower high blood pressure. You may need to change some of your habits. Lifestyle changes may include:  Following the DASH diet. This diet is high in fruits, vegetables, and whole grains. It is low in salt, red meat, and added sugars.  Getting at least 2 1/2 hours of brisk physical activity every week.  Losing weight if necessary.  Not smoking.  Limiting alcoholic beverages.  Learning ways to reduce stress. If lifestyle changes are not enough to get your blood pressure under control, your health care  provider may prescribe medicine. You may need to take more than one. Work closely with your health care provider to understand the risks and benefits. HOME CARE INSTRUCTIONS  Have your blood pressure rechecked as directed by your health care provider.   Only take medicine as directed by your health care provider. Follow the directions carefully. Blood pressure medicines must be taken as prescribed. The medicine does not work as well when you skip doses. Skipping doses also puts you at risk for problems.   Do not smoke.   Monitor your blood pressure at home as directed by your health care provider. SEEK MEDICAL CARE IF:   You think you are having a reaction to medicines taken.  You have recurrent headaches or feel dizzy.  You have swelling in your ankles.  You have trouble with your vision. SEEK IMMEDIATE MEDICAL CARE IF:  You develop a severe headache or confusion.  You have unusual weakness, numbness, or feel faint.  You have severe chest or abdominal pain.  You vomit repeatedly.  You have trouble breathing. MAKE SURE YOU:   Understand these instructions.  Will watch your condition.  Will get help right away if you are not doing well or get worse. Document Released: 03/29/2005 Document Revised: 04/03/2013 Document Reviewed: 01/19/2013 [facility]ExitCare Patient Information 2015 [place], [place]. This information is not intended to replace advice given to you by your health care provider. Make sure you  discuss any questions you have with your health care provider.  

## 2013-10-04 NOTE — Progress Notes (Signed)
Patient ID: Luis Larson, male   DOB: May 30, 1955, 58 y.o.   MRN: [id number]   Bernadette Tirella, is a 58 y.o. male  RKY:[id number]  [id number]  DOB - 10/03/1955  Chief Complaint  Patient presents with  . Follow-up  . Hypertension  . Diabetes        Subjective:   Luis Larson is a 58 y.o. male here today for a follow up visit. Patient has history of hypertension, diabetes mellitus, smoking addiction with no intention to quit. Patient his here today for routine follow-up. He takes his medications as prescribed with no side effects reported. He says he is also exercises daily and eating healthier, has gone down from waistline of 48 to 42. He has not had colonoscopy. He is requesting Viagra for erectile dysfunction. He denies any specific complaints today. Patient also drinks alcohol heavily. Patient has No headache, No chest pain, No abdominal pain - No Nausea, No new weakness tingling or numbness, No Cough - SOB.  Problem  DM (Diabetes Mellitus) Type 2, Uncontrolled, With Ketoacidosis  Other Male Erectile Dysfunction  Colon Cancer Screening    ALLERGIES: No Known Allergies  PAST MEDICAL HISTORY: Past Medical History  Diagnosis Date  . Essential hypertension, benign 12/22/2012    MEDICATIONS AT HOME: Prior to Admission medications   Medication Sig Start Date End Date Taking? Authorizing Provider  lisinopril-hydrochlorothiazide (PRINZIDE,ZESTORETIC) 10-12.5 MG per tablet Take 1 tablet by mouth daily. 10/04/13  Yes Jeanann Lewandowsky, MD  metFORMIN (GLUCOPHAGE XR) 500 MG 24 hr tablet Take 2 tablets (1,000 mg total) by mouth 2 (two) times daily with a meal. 10/04/13  Yes Jeanann Lewandowsky, MD  ciprofloxacin-dexamethasone (CIPRODEX) otic suspension Place 4 drops into both ears 2 (two) times daily.    Historical Provider, MD  glucose blood test strip Use as instructed 06/06/13   Richarda Overlie, MD  glucose monitoring kit (FREESTYLE) monitoring kit 1 each by Does not apply route as needed for  other. 06/06/13   Richarda Overlie, MD  oxyCODONE-acetaminophen (PERCOCET) 5-325 MG per tablet Take 2 tablets by mouth every 4 (four) hours as needed for pain. 10/08/12   Rolan Bucco, MD  sildenafil (VIAGRA) 50 MG tablet Take 1 tablet (50 mg total) by mouth daily as needed for erectile dysfunction. 10/04/13   Jeanann Lewandowsky, MD     Objective:   Filed Vitals:   10/04/13 1153  BP: 145/85  Pulse: 68  Temp: 97.9 F (36.6 C)  TempSrc: Oral  Resp: 18  Height: 5\' 11"  (1.803 m)  Weight: 291 lb (131.997 kg)  SpO2: 96%    Exam General appearance : Awake, alert, not in any distress. Speech Clear. Not toxic looking HEENT: Atraumatic and Normocephalic, pupils equally reactive to light and accomodation Neck: supple, no JVD. No cervical lymphadenopathy.  Chest:Good air entry bilaterally, no added sounds  CVS: S1 S2 regular, no murmurs.  Abdomen: Bowel sounds present, Non tender and not distended with no gaurding, rigidity or rebound. Extremities: B/L Lower Ext shows no edema, both legs are warm to touch Neurology: Awake alert, and oriented X 3, CN II-XII intact, Non focal Skin:No Rash Wounds:N/A  Data Review Lab Results  Component Value Date   HGBA1C 6.0 06/06/2013   HGBA1C 6.1 02/21/2013   HGBA1C 12.4 10/17/2012     Assessment & Plan   1. DM (diabetes mellitus) type 2, uncontrolled, with ketoacidosis  - HgB A1c - Glucose (CBG)  2. Essential hypertension, benign  - lisinopril-hydrochlorothiazide (PRINZIDE,ZESTORETIC) 10-12.5 MG per tablet; Take 1 tablet  by mouth daily.  Dispense: 90 tablet; Refill: 3  3. Other male erectile dysfunction  - sildenafil (VIAGRA) 50 MG tablet; Take 1 tablet (50 mg total) by mouth daily as needed for erectile dysfunction.  Dispense: 20 tablet; Refill: 3  4. Type II or unspecified type diabetes mellitus without mention of complication, uncontrolled  - metFORMIN (GLUCOPHAGE XR) 500 MG 24 hr tablet; Take 2 tablets (1,000 mg total) by mouth 2 (two) times  daily with a meal.  Dispense: 180 tablet; Refill: 3  5. Colon cancer screening  - [facility] COLONOSCOPY - Ambulatory referral to Gastroenterology   Patient was counseled extensively about nutrition and exercise.  Return in about 6 months (around 04/05/2014), or if symptoms worsen or fail to improve, for Hemoglobin A1C and Follow up, DM, Follow up HTN.  The patient was given clear instructions to go to ER or return to medical center if symptoms don't improve, worsen or new problems develop. The patient verbalized understanding. The patient was told to call to get lab results if they haven't heard anything in the next week.   This note has been created with Education officer, environmental. Any transcriptional errors are unintentional.    Jeanann Lewandowsky, MD, MHA, FACP, FAAP [facility] [place], [place] [phone]   10/04/2013, 12:10 PM

## 2014-01-24 ENCOUNTER — Telehealth: Payer: Self-pay | Admitting: Emergency Medicine

## 2014-01-24 ENCOUNTER — Telehealth: Payer: Self-pay | Admitting: Internal Medicine

## 2014-01-24 NOTE — Telephone Encounter (Signed)
Pt. Called to request a refill for ciprofloxacin-dexamethasone (CIPRODEX) otic suspension. Pt. Would like to know if he could get a refill or does he need to see a doctor. Please f/u with pt.

## 2014-01-24 NOTE — Telephone Encounter (Signed)
Informed pt he will need to come in for OV before refill Scheduled f/u appt with Dr. Hyman HopesJegede 02/04/14

## 2014-02-04 ENCOUNTER — Encounter: Payer: Self-pay | Admitting: Internal Medicine

## 2014-02-04 ENCOUNTER — Ambulatory Visit: Payer: Self-pay | Attending: Internal Medicine | Admitting: Internal Medicine

## 2014-02-04 VITALS — BP 131/77 | HR 65 | Temp 98.3°F | Resp 16 | Ht 71.0 in | Wt 279.0 lb

## 2014-02-04 DIAGNOSIS — H6123 Impacted cerumen, bilateral: Secondary | ICD-10-CM

## 2014-02-04 DIAGNOSIS — F17200 Nicotine dependence, unspecified, uncomplicated: Secondary | ICD-10-CM | POA: Insufficient documentation

## 2014-02-04 DIAGNOSIS — I10 Essential (primary) hypertension: Secondary | ICD-10-CM | POA: Insufficient documentation

## 2014-02-04 DIAGNOSIS — E119 Type 2 diabetes mellitus without complications: Secondary | ICD-10-CM | POA: Insufficient documentation

## 2014-02-04 LAB — POCT GLYCOSYLATED HEMOGLOBIN (HGB A1C): Hemoglobin A1C: 6.1

## 2014-02-04 LAB — GLUCOSE, POCT (MANUAL RESULT ENTRY): POC GLUCOSE: 75 mg/dL (ref 70–99)

## 2014-02-04 MED ORDER — CIPROFLOXACIN-DEXAMETHASONE 0.3-0.1 % OT SUSP: 4.0000 [drp] | Freq: Two times a day (BID) | OTIC | Status: DC

## 2014-02-04 NOTE — Progress Notes (Signed)
Pt is here following up on his HTN and diabetes. Pt is c/o hearing loss in his right ear w/ no pain.

## 2014-02-04 NOTE — Patient Instructions (Signed)
Carbamide Peroxide ear solution What is this medicine? CARBAMIDE PEROXIDE (CAR bah mide per OX ide) is used to soften and help remove ear wax. This medicine may be used for other purposes; ask your health care provider or pharmacist if you have questions. COMMON BRAND NAME(S): Auro Ear, Auro Earache Relief, Debrox, Ear Drops, Ear Wax Removal, Ear Wax Remover, Earwax Treatment, Murine, Thera-Ear What should I tell my health care provider before I take this medicine? They need to know if you have any of these conditions: -dizziness -ear discharge -ear pain, irritation or rash -infection -perforated eardrum (hole in eardrum) -an unusual or allergic reaction to carbamide peroxide, glycerin, hydrogen peroxide, other medicines, foods, dyes, or preservatives -pregnant or trying to get pregnant -breast-feeding How should I use this medicine? This medicine is only for use in the outer ear canal. Follow the directions carefully. Wash hands before and after use. The solution may be warmed by holding the bottle in the hand for 1 to 2 minutes. Lie with the affected ear facing upward. Place the proper number of drops into the ear canal. After the drops are instilled, remain lying with the affected ear upward for 5 minutes to help the drops stay in the ear canal. A cotton ball may be gently inserted at the ear opening for no longer than 5 to 10 minutes to ensure retention. Repeat, if necessary, for the opposite ear. Do not touch the tip of the dropper to the ear, fingertips, or other surface. Do not rinse the dropper after use. Keep container tightly closed. Talk to your pediatrician regarding the use of this medicine in children. While this drug may be used in children as young as 12 years for selected conditions, precautions do apply. Overdosage: If you think you have taken too much of this medicine contact a poison control center or emergency room at once. NOTE: This medicine is only for you. Do not share  this medicine with others. What if I miss a dose? If you miss a dose, use it as soon as you can. If it is almost time for your next dose, use only that dose. Do not use double or extra doses. What may interact with this medicine? Interactions are not expected. Do not use any other ear products without asking your doctor or health care professional. This list may not describe all possible interactions. Give your health care provider a list of all the medicines, herbs, non-prescription drugs, or dietary supplements you use. Also tell them if you smoke, drink alcohol, or use illegal drugs. Some items may interact with your medicine. What should I watch for while using this medicine? This medicine is not for long-term use. Do not use for more than 4 days without checking with your health care professional. Contact your doctor or health care professional if your condition does not start to get better within a few days or if you notice burning, redness, itching or swelling. What side effects may I notice from receiving this medicine? Side effects that you should report to your doctor or health care professional as soon as possible: -allergic reactions like skin rash, itching or hives, swelling of the face, lips, or tongue -burning, itching, and redness -worsening ear pain -rash Side effects that usually do not require medical attention (report to your doctor or health care professional if they continue or are bothersome): -abnormal sensation while putting the drops in the ear -temporary reduction in hearing (but not complete loss of hearing) This list may not   describe all possible side effects. Call your doctor for medical advice about side effects. You may report side effects to FDA at 1-800-FDA-1088. Where should I keep my medicine? Keep out of the reach of children. Store at room temperature between 15 and 30 degrees C (59 and 86 degrees F) in a tight, light-resistant container. Keep bottle away  from excessive heat and direct sunlight. Throw away any unused medicine after the expiration date. NOTE: This sheet is a summary. It may not cover all possible information. If you have questions about this medicine, talk to your doctor, pharmacist, or health care provider.  2015, Elsevier/Gold Standard. (2007-07-11 14:00:02)  

## 2014-02-04 NOTE — Progress Notes (Signed)
Patient ID: Luis Larson, male   DOB: 11-01-1955, 58 y.o.   MRN: [id number]   Luis Larson, is a 58 y.o. male  [name]  [name]  DOB - March 24, 1956  Chief Complaint  Patient presents with  . Follow-up        Subjective:   Luis Larson is a 58 y.o. male here today for a follow up visit. PMH significant for HTN and DM2.  He currently smokes 1 ppd with no intentions of quitting. He does not consume alcohol.  He is compliant with his medications.  He has lost approximately 33 lbs over the last 6 months.  He reports eating healthier.  He does not exercise because he is "always on the go" at work.  He checks his blood sugars at home. His readings range from 65-130 (mainly in the 70's).  He has no specific complaints regarding his DM or HTN.  He recently felt as though his R ear was "clogged" and he could not hear as well.  He does use Q-tips to clean his ears.  This resolved after "sleeping with the heat on one night."  He is requesting a refill of his "ear drops" because they help keep his ears clean.    ALLERGIES: No Known Allergies  PAST MEDICAL HISTORY: Past Medical History  Diagnosis Date  . Essential hypertension, benign 12/22/2012    MEDICATIONS AT HOME: Prior to Admission medications   Medication Sig Start Date End Date Taking? Authorizing Provider  glucose blood test strip Use as instructed 06/06/13  Yes Richarda Overlie, MD  glucose monitoring kit (FREESTYLE) monitoring kit 1 each by Does not apply route as needed for other. 06/06/13  Yes Richarda Overlie, MD  lisinopril-hydrochlorothiazide (PRINZIDE,ZESTORETIC) 10-12.5 MG per tablet Take 1 tablet by mouth daily. 10/04/13  Yes Quentin Angst, MD  metFORMIN (GLUCOPHAGE XR) 500 MG 24 hr tablet Take 2 tablets (1,000 mg total) by mouth 2 (two) times daily with a meal. 10/04/13  Yes Quentin Angst, MD  ciprofloxacin-dexamethasone (CIPRODEX) otic suspension Place 4 drops into both ears 2 (two) times daily.    Historical Provider,  MD  oxyCODONE-acetaminophen (PERCOCET) 5-325 MG per tablet Take 2 tablets by mouth every 4 (four) hours as needed for pain. 10/08/12   Rolan Bucco, MD  sildenafil (VIAGRA) 50 MG tablet Take 1 tablet (50 mg total) by mouth daily as needed for erectile dysfunction. 10/04/13   Quentin Angst, MD   Review of Systems  Constitutional: Positive for weight loss.       Intentional  HENT: Positive for hearing loss.        ? Cerumen impaction  Eyes: Negative.   Respiratory: Negative.   Cardiovascular: Negative.   Gastrointestinal: Positive for diarrhea.       Intermittent from metformin  Genitourinary: Negative.   Musculoskeletal: Negative.   Skin: Negative.   Neurological: Negative.   Endo/Heme/Allergies: Negative.   Psychiatric/Behavioral: Negative.      Objective:   Filed Vitals:   02/04/14 1521  BP: 131/77  Pulse: 65  Temp: 98.3 F (36.8 C)  TempSrc: Oral  Resp: 16  Height: 5\' 11"  (1.803 m)  Weight: 279 lb (126.554 kg)  SpO2: 92%    Exam General appearance : Awake, alert, not in any distress. Speech Clear. Not toxic looking HEENT: Atraumatic and Normocephalic, pupils equally reactive to light and accomodation.  Mod cerumen in B ear canals with L>R.  Normal TMs. Neck: supple, no JVD. No cervical lymphadenopathy.  Chest:Good air entry bilaterally,  no added sounds  CVS: S1 S2 regular, no murmurs.  Abdomen: Bowel sounds present, Non tender and not distended with no gaurding, rigidity or rebound. Extremities: B/L Lower Ext shows no edema, both legs are warm to touch Neurology: Awake alert, and oriented X 3, CN II-XII intact, Non focal Skin:No Rash Wounds:N/A  Data Review Lab Results  Component Value Date   HGBA1C 5.9 10/04/2013   HGBA1C 6.0 06/06/2013   HGBA1C 6.1 02/21/2013     Assessment & Plan   1. Type 2 diabetes mellitus without complication  P: Currently well controlled on medication.  Continue current regimen. - Glucose (CBG) - HgB A1c  Aim for 2-3  Carb Choices per meal (30-45 grams) +/- 1 either way  Aim for 0-15 Carbs per snack if hungry  Include protein in moderation with your meals and snacks  Consider reading food labels for Total Carbohydrate and Fat Grams of foods  Consider checking BG at alternate times per day  Continue taking medication as directed Fruit Punch - find one with no sugar  Measure and decrease portions of carbohydrate foods  Make your plate and don't go back for seconds   2. HTN  P: Well controlled. Continue current regimen. Reinforced DASH diet and exercise.  - We have discussed target BP range and blood pressure goal - I have advised patient to check BP regularly and to call us back or report to clinic if the numbers are consistently higher than 140/90  - We discussed the importance of compliance with medical therapy and DASH diet recommended, consequences of uncontrolled hypertension discussed.  - continue current BP medications  3. Smoking cessation- education provided. Pt is not agreeable to quitting.  The risks of continued smoking were discussed.    The patient was counseled on the dangers of tobacco use, and was advised to quit. Reviewed strategies to maximize success, including removing cigarettes and smoking materials from environment, stress management and support of family/friends.   Luis Larson declines colonoscopy as well.  The risks vs. Benefits of screening were discussed and he continues to decline to have screening preformed at this time.      Luis Grattan, FNP-student  Evaluation and management procedures were performed by the Advanced Practitioner under my supervision and collaboration. I have reviewed the Advanced Practitioner's note and chart, and I agree with the management and plan.   Jeanann Lewandowsky, MD, MHA, FACP, FAAP [facility] [place], [place] [phone]   02/04/2014, 3:42 PM

## 2014-05-20 ENCOUNTER — Ambulatory Visit: Payer: Self-pay | Admitting: Internal Medicine

## 2014-05-23 ENCOUNTER — Other Ambulatory Visit: Payer: Self-pay | Admitting: Internal Medicine

## 2014-05-23 NOTE — Telephone Encounter (Signed)
Pt advised to pick up Rx tomorrow at [facility] has one more refills

## 2014-05-23 NOTE — Telephone Encounter (Signed)
Pt missed his appt last Monday 2-8th and is out of his diabetes medication. Please follow up with pt as he is not able to get an appt till 1 week and a half out.

## 2014-06-03 ENCOUNTER — Ambulatory Visit: Payer: Self-pay | Admitting: Internal Medicine

## 2014-09-16 ENCOUNTER — Ambulatory Visit: Payer: Self-pay | Attending: Internal Medicine | Admitting: Internal Medicine

## 2014-09-16 ENCOUNTER — Encounter: Payer: Self-pay | Admitting: Internal Medicine

## 2014-09-16 VITALS — BP 153/89 | HR 68 | Temp 98.6°F | Wt 292.0 lb

## 2014-09-16 DIAGNOSIS — H6123 Impacted cerumen, bilateral: Secondary | ICD-10-CM | POA: Insufficient documentation

## 2014-09-16 DIAGNOSIS — Z1211 Encounter for screening for malignant neoplasm of colon: Secondary | ICD-10-CM

## 2014-09-16 DIAGNOSIS — E119 Type 2 diabetes mellitus without complications: Secondary | ICD-10-CM | POA: Insufficient documentation

## 2014-09-16 DIAGNOSIS — I10 Essential (primary) hypertension: Secondary | ICD-10-CM | POA: Insufficient documentation

## 2014-09-16 DIAGNOSIS — F17200 Nicotine dependence, unspecified, uncomplicated: Secondary | ICD-10-CM | POA: Insufficient documentation

## 2014-09-16 DIAGNOSIS — H6120 Impacted cerumen, unspecified ear: Secondary | ICD-10-CM | POA: Insufficient documentation

## 2014-09-16 DIAGNOSIS — E669 Obesity, unspecified: Secondary | ICD-10-CM | POA: Insufficient documentation

## 2014-09-16 LAB — POCT GLYCOSYLATED HEMOGLOBIN (HGB A1C): HEMOGLOBIN A1C: 6.2

## 2014-09-16 MED ORDER — CIPROFLOXACIN-DEXAMETHASONE 0.3-0.1 % OT SUSP: 4.0000 [drp] | Freq: Two times a day (BID) | OTIC | Status: DC

## 2014-09-16 MED ORDER — NEOMYCIN-POLYMYXIN-HC 3.5-10000-1 OT SOLN: 4.0000 [drp] | Freq: Three times a day (TID) | OTIC | Status: DC

## 2014-09-16 MED ORDER — LISINOPRIL-HYDROCHLOROTHIAZIDE 10-12.5 MG PO TABS: 1.0000 | ORAL_TABLET | Freq: Every day | ORAL | Status: DC

## 2014-09-16 MED ORDER — METFORMIN HCL ER 500 MG PO TB24: 1000.0000 mg | ORAL_TABLET | Freq: Two times a day (BID) | ORAL | Status: DC

## 2014-09-16 NOTE — Patient Instructions (Signed)
DASH Eating Plan DASH stands for "Dietary Approaches to Stop Hypertension." The DASH eating plan is a healthy eating plan that has been shown to reduce high blood pressure (hypertension). Additional health benefits may include reducing the risk of type 2 diabetes mellitus, heart disease, and stroke. The DASH eating plan may also help with weight loss. WHAT DO I NEED TO KNOW ABOUT THE DASH EATING PLAN? For the DASH eating plan, you will follow these general guidelines:  Choose foods with a percent daily value for sodium of less than 5% (as listed on the food label).  Use salt-free seasonings or herbs instead of table salt or sea salt.  Check with your health care provider or pharmacist before using salt substitutes.  Eat lower-sodium products, often labeled as "lower sodium" or "no salt added."  Eat fresh foods.  Eat more vegetables, fruits, and low-fat dairy products.  Choose whole grains. Look for the word "whole" as the first word in the ingredient list.  Choose fish and skinless chicken or turkey more often than red meat. Limit fish, poultry, and meat to 6 oz (170 g) each day.  Limit sweets, desserts, sugars, and sugary drinks.  Choose heart-healthy fats.  Limit cheese to 1 oz (28 g) per day.  Eat more home-cooked food and less restaurant, buffet, and fast food.  Limit fried foods.  Cook foods using methods other than frying.  Limit canned vegetables. If you do use them, rinse them well to decrease the sodium.  When eating at a restaurant, ask that your food be prepared with less salt, or no salt if possible. WHAT FOODS CAN I EAT? Seek help from a dietitian for individual calorie needs. Grains Whole grain or whole wheat bread. Brown rice. Whole grain or whole wheat pasta. Quinoa, bulgur, and whole grain cereals. Low-sodium cereals. Corn or whole wheat flour tortillas. Whole grain cornbread. Whole grain crackers. Low-sodium crackers. Vegetables Fresh or frozen vegetables  (raw, steamed, roasted, or grilled). Low-sodium or reduced-sodium tomato and vegetable juices. Low-sodium or reduced-sodium tomato sauce and paste. Low-sodium or reduced-sodium canned vegetables.  Fruits All fresh, canned (in natural juice), or frozen fruits. Meat and Other Protein Products Ground beef (85% or leaner), grass-fed beef, or beef trimmed of fat. Skinless chicken or turkey. Ground chicken or turkey. Pork trimmed of fat. All fish and seafood. Eggs. Dried beans, peas, or lentils. Unsalted nuts and seeds. Unsalted canned beans. Dairy Low-fat dairy products, such as skim or 1% milk, 2% or reduced-fat cheeses, low-fat ricotta or cottage cheese, or plain low-fat yogurt. Low-sodium or reduced-sodium cheeses. Fats and Oils Tub margarines without trans fats. Light or reduced-fat mayonnaise and salad dressings (reduced sodium). Avocado. Safflower, olive, or canola oils. Natural peanut or almond butter. Other Unsalted popcorn and pretzels. The items listed above may not be a complete list of recommended foods or beverages. Contact your dietitian for more options. WHAT FOODS ARE NOT RECOMMENDED? Grains White bread. White pasta. White rice. Refined cornbread. Bagels and croissants. Crackers that contain trans fat. Vegetables Creamed or fried vegetables. Vegetables in a cheese sauce. Regular canned vegetables. Regular canned tomato sauce and paste. Regular tomato and vegetable juices. Fruits Dried fruits. Canned fruit in light or heavy syrup. Fruit juice. Meat and Other Protein Products Fatty cuts of meat. Ribs, chicken wings, bacon, sausage, bologna, salami, chitterlings, fatback, hot dogs, bratwurst, and packaged luncheon meats. Salted nuts and seeds. Canned beans with salt. Dairy Whole or 2% milk, cream, half-and-half, and cream cheese. Whole-fat or sweetened yogurt. Full-fat   cheeses or blue cheese. Nondairy creamers and whipped toppings. Processed cheese, cheese spreads, or cheese  curds. Condiments Onion and garlic salt, seasoned salt, table salt, and sea salt. Canned and packaged gravies. Worcestershire sauce. Tartar sauce. Barbecue sauce. Teriyaki sauce. Soy sauce, including reduced sodium. Steak sauce. Fish sauce. Oyster sauce. Cocktail sauce. Horseradish. Ketchup and mustard. Meat flavorings and tenderizers. Bouillon cubes. Hot sauce. Tabasco sauce. Marinades. Taco seasonings. Relishes. Fats and Oils Butter, stick margarine, lard, shortening, ghee, and bacon fat. Coconut, palm kernel, or palm oils. Regular salad dressings. Other Pickles and olives. Salted popcorn and pretzels. The items listed above may not be a complete list of foods and beverages to avoid. Contact your dietitian for more information. WHERE CAN I FIND MORE INFORMATION? National Heart, Lung, and Blood Institute: www.nhlbi.nih.gov/health/health-topics/topics/dash/ Document Released: 03/18/2011 Document Revised: 08/13/2013 Document Reviewed: 01/31/2013 ExitCare Patient Information 2015 [employer]. This information is not intended to replace advice given to you by your health care provider. Make sure you discuss any questions you have with your health care provider. Hypertension Hypertension, commonly called high blood pressure, is when the force of blood pumping through your arteries is too strong. Your arteries are the blood vessels that carry blood from your heart throughout your body. A blood pressure reading consists of a higher number over a lower number, such as 110/72. The higher number (systolic) is the pressure inside your arteries when your heart pumps. The lower number (diastolic) is the pressure inside your arteries when your heart relaxes. Ideally you want your blood pressure below 120/80. Hypertension forces your heart to work harder to pump blood. Your arteries may become narrow or stiff. Having hypertension puts you at risk for heart disease, stroke, and other problems.  RISK  FACTORS Some risk factors for high blood pressure are controllable. Others are not.  Risk factors you cannot control include:   Race. You may be at higher risk if you are African American.  Age. Risk increases with age.  Gender. Men are at higher risk than women before age 45 years. After age 65, women are at higher risk than men. Risk factors you can control include:  Not getting enough exercise or physical activity.  Being overweight.  Getting too much fat, sugar, calories, or salt in your diet.  Drinking too much alcohol. SIGNS AND SYMPTOMS Hypertension does not usually cause signs or symptoms. Extremely high blood pressure (hypertensive crisis) may cause headache, anxiety, shortness of breath, and nosebleed. DIAGNOSIS  To check if you have hypertension, your health care provider will measure your blood pressure while you are seated, with your arm held at the level of your heart. It should be measured at least twice using the same arm. Certain conditions can cause a difference in blood pressure between your right and left arms. A blood pressure reading that is higher than normal on one occasion does not mean that you need treatment. If one blood pressure reading is high, ask your health care provider about having it checked again. TREATMENT  Treating high blood pressure includes making lifestyle changes and possibly taking medicine. Living a healthy lifestyle can help lower high blood pressure. You may need to change some of your habits. Lifestyle changes may include:  Following the DASH diet. This diet is high in fruits, vegetables, and whole grains. It is low in salt, red meat, and added sugars.  Getting at least 2 hours of brisk physical activity every week.  Losing weight if necessary.  Not smoking.  Limiting   alcoholic beverages.  Learning ways to reduce stress. If lifestyle changes are not enough to get your blood pressure under control, your health care provider may  prescribe medicine. You may need to take more than one. Work closely with your health care provider to understand the risks and benefits. HOME CARE INSTRUCTIONS  Have your blood pressure rechecked as directed by your health care provider.   Take medicines only as directed by your health care provider. Follow the directions carefully. Blood pressure medicines must be taken as prescribed. The medicine does not work as well when you skip doses. Skipping doses also puts you at risk for problems.   Do not smoke.   Monitor your blood pressure at home as directed by your health care provider. SEEK MEDICAL CARE IF:   You think you are having a reaction to medicines taken.  You have recurrent headaches or feel dizzy.  You have swelling in your ankles.  You have trouble with your vision. SEEK IMMEDIATE MEDICAL CARE IF:  You develop a severe headache or confusion.  You have unusual weakness, numbness, or feel faint.  You have severe chest or abdominal pain.  You vomit repeatedly.  You have trouble breathing. MAKE SURE YOU:   Understand these instructions.  Will watch your condition.  Will get help right away if you are not doing well or get worse. Document Released: 03/29/2005 Document Revised: 08/13/2013 Document Reviewed: 01/19/2013 ExitCare Patient Information 2015 [employer]. This information is not intended to replace advice given to you by your health care provider. Make sure you discuss any questions you have with your health care provider. Diabetes and Exercise Exercising regularly is important. It is not just about losing weight. It has many health benefits, such as:  Improving your overall fitness, flexibility, and endurance.  Increasing your bone density.  Helping with weight control.  Decreasing your body fat.  Increasing your muscle strength.  Reducing stress and tension.  Improving your overall health. People with diabetes who exercise gain  additional benefits because exercise:  Reduces appetite.  Improves the body's use of blood sugar (glucose).  Helps lower or control blood glucose.  Decreases blood pressure.  Helps control blood lipids (such as cholesterol and triglycerides).  Improves the body's use of the hormone insulin by:  Increasing the body's insulin sensitivity.  Reducing the body's insulin needs.  Decreases the risk for heart disease because exercising:  Lowers cholesterol and triglycerides levels.  Increases the levels of good cholesterol (such as high-density lipoproteins [HDL]) in the body.  Lowers blood glucose levels. YOUR ACTIVITY PLAN  Choose an activity that you enjoy and set realistic goals. Your health care provider or diabetes educator can help you make an activity plan that works for you. Exercise regularly as directed by your health care provider. This includes:  Performing resistance training twice a week such as push-ups, sit-ups, lifting weights, or using resistance bands.  Performing 150 minutes of cardio exercises each week such as walking, running, or playing sports.  Staying active and spending no more than 90 minutes at one time being inactive. Even short bursts of exercise are good for you. Three 10-minute sessions spread throughout the day are just as beneficial as a single 30-minute session. Some exercise ideas include:  Taking the dog for a walk.  Taking the stairs instead of the elevator.  Dancing to your favorite song.  Doing an exercise video.  Doing your favorite exercise with a friend. RECOMMENDATIONS FOR EXERCISING WITH TYPE 1 OR   TYPE 2 DIABETES   Check your blood glucose before exercising. If blood glucose levels are greater than 240 mg/dL, check for urine ketones. Do not exercise if ketones are present.  Avoid injecting insulin into areas of the body that are going to be exercised. For example, avoid injecting insulin into:  The arms when playing  tennis.  The legs when jogging.  Keep a record of:  Food intake before and after you exercise.  Expected peak times of insulin action.  Blood glucose levels before and after you exercise.  The type and amount of exercise you have done.  Review your records with your health care provider. Your health care provider will help you to develop guidelines for adjusting food intake and insulin amounts before and after exercising.  If you take insulin or oral hypoglycemic agents, watch for signs and symptoms of hypoglycemia. They include:  Dizziness.  Shaking.  Sweating.  Chills.  Confusion.  Drink plenty of water while you exercise to prevent dehydration or heat stroke. Body water is lost during exercise and must be replaced.  Talk to your health care provider before starting an exercise program to make sure it is safe for you. Remember, almost any type of activity is better than none. Document Released: 06/19/2003 Document Revised: 08/13/2013 Document Reviewed: 09/05/2012 ExitCare Patient Information 2015 [employer]. This information is not intended to replace advice given to you by your health care provider. Make sure you discuss any questions you have with your health care provider. Basic Carbohydrate Counting for Diabetes Mellitus Carbohydrate counting is a method for keeping track of the amount of carbohydrates you eat. Eating carbohydrates naturally increases the level of sugar (glucose) in your blood, so it is important for you to know the amount that is okay for you to have in every meal. Carbohydrate counting helps keep the level of glucose in your blood within normal limits. The amount of carbohydrates allowed is different for every person. A dietitian can help you calculate the amount that is right for you. Once you know the amount of carbohydrates you can have, you can count the carbohydrates in the foods you want to eat. Carbohydrates are found in the following  foods:  Grains, such as breads and cereals.  Dried beans and soy products.  Starchy vegetables, such as potatoes, peas, and corn.  Fruit and fruit juices.  Milk and yogurt.  Sweets and snack foods, such as cake, cookies, candy, chips, soft drinks, and fruit drinks. CARBOHYDRATE COUNTING There are two ways to count the carbohydrates in your food. You can use either of the methods or a combination of both. Reading the "Nutrition Facts" on Packaged Food The "Nutrition Facts" is an area that is included on the labels of almost all packaged food and beverages in the [place]. It includes the serving size of that food or beverage and information about the nutrients in each serving of the food, including the grams (g) of carbohydrate per serving.  Decide the number of servings of this food or beverage that you will be able to eat or drink. Multiply that number of servings by the number of grams of carbohydrate that is listed on the label for that serving. The total will be the amount of carbohydrates you will be having when you eat or drink this food or beverage. Learning Standard Serving Sizes of Food When you eat food that is not packaged or does not include "Nutrition Facts" on the label, you need to measure the   servings in order to count the amount of carbohydrates.A serving of most carbohydrate-rich foods contains about 15 g of carbohydrates. The following list includes serving sizes of carbohydrate-rich foods that provide 15 g ofcarbohydrate per serving:   1 slice of bread (1 oz) or 1 six-inch tortilla.    of a hamburger bun or English muffin.  4-6 crackers.   cup unsweetened dry cereal.    cup hot cereal.   cup rice or pasta.    cup mashed potatoes or  of a large baked potato.  1 cup fresh fruit or one small piece of fruit.    cup canned or frozen fruit or fruit juice.  1 cup milk.   cup plain fat-free yogurt or yogurt sweetened with artificial  sweeteners.   cup cooked dried beans or starchy vegetable, such as peas, corn, or potatoes.  Decide the number of standard-size servings that you will eat. Multiply that number of servings by 15 (the grams of carbohydrates in that serving). For example, if you eat 2 cups of strawberries, you will have eaten 2 servings and 30 g of carbohydrates (2 servings x 15 g = 30 g). For foods such as soups and casseroles, in which more than one food is mixed in, you will need to count the carbohydrates in each food that is included. EXAMPLE OF CARBOHYDRATE COUNTING Sample Dinner  3 oz chicken breast.   cup of brown rice.   cup of corn.  1 cup milk.   1 cup strawberries with sugar-free whipped topping.  Carbohydrate Calculation Step 1: Identify the foods that contain carbohydrates:   Rice.   Corn.   Milk.   Strawberries. Step 2:Calculate the number of servings eaten of each:   2 servings of rice.   1 serving of corn.   1 serving of milk.   1 serving of strawberries. Step 3: Multiply each of those number of servings by 15 g:   2 servings of rice x 15 g = 30 g.   1 serving of corn x 15 g = 15 g.   1 serving of milk x 15 g = 15 g.   1 serving of strawberries x 15 g = 15 g. Step 4: Add together all of the amounts to find the total grams of carbohydrates eaten: 30 g + 15 g + 15 g + 15 g = 75 g. Document Released: 03/29/2005 Document Revised: 08/13/2013 Document Reviewed: 02/23/2013 ExitCare Patient Information 2015 [employer]. This information is not intended to replace advice given to you by your health care provider. Make sure you discuss any questions you have with your health care provider.  

## 2014-09-16 NOTE — Progress Notes (Signed)
Pt here for follow up. Needs all medications refilled. Shawna OrleansMeredith B Glendel Jaggers

## 2014-09-16 NOTE — Progress Notes (Signed)
Subjective:     Patient ID: Luis Larson, male   DOB: December 06, 1955, 59 y.o.   MRN: [id number]  HPI Mr. Macaskill is a 59 yo male with history of essential hypertension, Type 2 DM, and 66 pack year smoking history today for a follow up visit. Patient says he usually takes all of his medications everyday without missing any doses, but he has not taken any of his medications for the last 2 weeks because he ran out of them and needs refills today.  Patient notes he has not been eating as healthy because it is hard for him to stick to a diet low in salt and sugar. He reports walking around all day during his work and does not do any additional exercise. He reports smoking an average of 1.5 packs a day for the last 44 years and is interested in quitting smoking. Denies headaches, dizziness or lightheadedness, blurry vision, urinary symptoms, any numbness or tingling. No cough, SOB, dyspnea, chest pain, palpitations.  He reports recently having a ear infection and would like to get his ears checked again today.   Active Ambulatory Problems    Diagnosis Date Noted  . Type II or unspecified type diabetes mellitus without mention of complication, uncontrolled 10/17/2012  . Ear infection 10/17/2012  . Diabetes 12/22/2012  . Abdominal pain, right upper quadrant 12/22/2012  . Essential hypertension, benign 12/22/2012  . Cough 12/22/2012  . DM (diabetes mellitus) type 2, uncontrolled, with ketoacidosis 10/04/2013  . Other male erectile dysfunction 10/04/2013  . Colon cancer screening 10/04/2013  . Excess ear wax 09/16/2014   Resolved Ambulatory Problems    Diagnosis Date Noted  . No Resolved Ambulatory Problems   No Additional Past Medical History       Medication List       This list is accurate as of: 09/16/14 11:59 PM.  Always use your most recent med list.               glucose blood test strip  Use as instructed     glucose monitoring kit monitoring kit  1 each by Does not apply route as  needed for other.     lisinopril-hydrochlorothiazide 10-12.5 MG per tablet  Commonly known as:  PRINZIDE,ZESTORETIC  Take 1 tablet by mouth daily.     metFORMIN 500 MG 24 hr tablet  Commonly known as:  GLUCOPHAGE XR  Take 2 tablets (1,000 mg total) by mouth 2 (two) times daily with a meal.     neomycin-polymyxin-hydrocortisone otic solution  Commonly known as:  CORTISPORIN  Place 4 drops into both ears 3 (three) times daily.     oxyCODONE-acetaminophen 5-325 MG per tablet  Commonly known as:  PERCOCET  Take 2 tablets by mouth every 4 (four) hours as needed for pain.     sildenafil 50 MG tablet  Commonly known as:  VIAGRA  Take 1 tablet (50 mg total) by mouth daily as needed for erectile dysfunction.          Review of Systems  Constitutional: Negative for fever, activity change, fatigue and unexpected weight change.  Eyes: Negative for visual disturbance.  Respiratory: Negative for cough, chest tightness and shortness of breath.   Cardiovascular: Negative for chest pain, palpitations and leg swelling.  Gastrointestinal: Negative for nausea, vomiting, abdominal pain, diarrhea, constipation and blood in stool.  Endocrine: Negative for polyuria.  Genitourinary: Negative for dysuria, urgency, frequency and difficulty urinating.  Neurological: Negative for dizziness, weakness, light-headedness and headaches.  Objective: Filed Vitals:   09/16/14 1644  BP: 175/91  Pulse: 68  Temp: 98.6 F (37 C)      Physical Exam  Constitutional:  Well-appearing 59 yo obese man alert, oriented, and in no acute distress.  HENT:  Head: Normocephalic and atraumatic.  Right Ear: External ear normal.  Left Ear: External ear normal.  Mouth/Throat: No oropharyngeal exudate.  Eyes: Conjunctivae and EOM are normal. Pupils are equal, round, and reactive to light.  Neck: Normal range of motion. Neck supple. No JVD present. No thyromegaly present.  Cardiovascular: Normal rate, regular  rhythm, normal heart sounds and intact distal pulses.   Pulmonary/Chest: Effort normal and breath sounds normal. He exhibits no tenderness.  Musculoskeletal: Normal range of motion. He exhibits no edema or tenderness.  Lymphadenopathy:    He has no cervical adenopathy.    Assessment & Plan:   Luis Larson is a 59 yo male with history of essential hypertension, Type 2 DM, and 66 pack year smoking history today for a follow up visit.   Type 2 diabetes mellitus without complication Patient's A1C today 6.2 and his diabetes is well-controlled. Counseled patient on importance of diet low in sugar and fats, daily exercise, smoking cessation, and continued medication adherence to maintain good glycemic control. No medication adjustment needed today. Will refill prescription for metformin and order following labs: Orders: -     HgB A1c -     COMPLETE METABOLIC PANEL WITH GFR -     Lipid panel -     metFORMIN (GLUCOPHAGE XR) 500 MG 24 hr tablet; Take 2 tablets (1,000 mg total) by mouth 2 (two) times daily with a meal. -     Microalbumin/Creatinine Ratio, Urine -     Microalbumin, urine -     Ambulatory referral to Ophthalmology -     Ambulatory referral to Podiatry  Essential hypertension, benign Patient's BP today is 153/89. He has not taken his medications for the last 2 weeks because he ran out of them. Counseled patient on DASH diet, daily exercise, smoking cessation, and continued medication adherence. Orders: -     lisinopril-hydrochlorothiazide (PRINZIDE,ZESTORETIC) 10-12.5 MG per tablet; Take 1 tablet by mouth daily.  Excess ear wax, bilateral Orders: -     Discontinue: ciprofloxacin-dexamethasone (CIPRODEX) otic suspension; Place 4 drops into both ears 2 (two) times daily.  Colon cancer screening Orders: -     Ambulatory referral to Gastroenterology -     [facility] COLONOSCOPY  Other orders -     neomycin-polymyxin-hydrocortisone (CORTISPORIN) otic solution; Place 4 drops into both ears 3  (three) times daily.   Evaluation and management procedures were performed by me with Medical Student in attendance, note written by medical student under my supervision and collaboration. I have reviewed the note and I agree with the management and plan.   Jeanann Lewandowsky, MD, MHA, CPE, FACP, FAAP [facility] [place], [place] [phone]   09/27/2014, 9:30 AM

## 2014-09-17 LAB — COMPLETE METABOLIC PANEL WITH GFR
ALT: 22 U/L (ref 0–53)
AST: 15 U/L (ref 0–37)
Albumin: 4.1 g/dL (ref 3.5–5.2)
Alkaline Phosphatase: 87 U/L (ref 39–117)
BILIRUBIN TOTAL: 0.4 mg/dL (ref 0.2–1.2)
BUN: 14 mg/dL (ref 6–23)
CHLORIDE: 104 meq/L (ref 96–112)
CO2: 28 mEq/L (ref 19–32)
Calcium: 9.2 mg/dL (ref 8.4–10.5)
Creat: 0.87 mg/dL (ref 0.50–1.35)
GLUCOSE: 72 mg/dL (ref 70–99)
Potassium: 4.6 mEq/L (ref 3.5–5.3)
Sodium: 140 mEq/L (ref 135–145)
TOTAL PROTEIN: 6.7 g/dL (ref 6.0–8.3)

## 2014-09-17 LAB — LIPID PANEL
CHOLESTEROL: 151 mg/dL (ref 0–200)
HDL: 37 mg/dL — ABNORMAL LOW (ref >=40)
LDL CALC: 92 mg/dL (ref 0–99)
Total CHOL/HDL Ratio: 4.1 Ratio
Triglycerides: 108 mg/dL (ref <150)
VLDL: 22 mg/dL (ref 0–40)

## 2014-09-17 LAB — MICROALBUMIN / CREATININE URINE RATIO
Creatinine, Urine: 177.9 mg/dL
Microalb Creat Ratio: 3.4 mg/g (ref 0.0–30.0)
Microalb, Ur: 0.6 mg/dL (ref <2.0)

## 2014-09-24 ENCOUNTER — Telehealth: Payer: Self-pay

## 2014-09-24 NOTE — Telephone Encounter (Signed)
Nurse called patient, reached voicemail. Left message for patient to call Raeshawn Tafolla at [phone].   

## 2014-09-24 NOTE — Telephone Encounter (Signed)
-----   Message from Quentin Angst, MD sent at 09/24/2014  1:08 PM EDT ----- Please inform patient that his laboratory results are within normal limits.

## 2014-09-25 NOTE — Telephone Encounter (Signed)
Nurse called patient, patient verified date of birth. Patient aware of normal lab results. Patient voices understanding and has no questions at this time. 

## 2014-09-25 NOTE — Telephone Encounter (Signed)
-----   Message from Olugbemiga E Jegede, MD sent at 09/24/2014  1:08 PM EDT ----- Please inform patient that his laboratory results are within normal limits. 

## 2014-12-09 ENCOUNTER — Telehealth: Payer: Self-pay | Admitting: Internal Medicine

## 2014-12-09 NOTE — Telephone Encounter (Signed)
Patient called to request a med refill for all of his current medications, patient uses [facility] pharmacy. Please f/u with pt.

## 2014-12-10 ENCOUNTER — Other Ambulatory Visit: Payer: Self-pay | Admitting: *Deleted

## 2014-12-10 NOTE — Telephone Encounter (Signed)
Patient requesting a refill on lisinopril-hydrochlorothiazide (PRINZIDE,ZESTORETIC) 10-12.5 MG per tablet. Please follow up. No appt's available for jegede. Please follow up with patient if sample can be approved. Thank you.

## 2014-12-10 NOTE — Telephone Encounter (Signed)
Patient called asking for refill on his Lisinopril.  Called patient to let him know he has additional refills already.  Patient verbalized understanding.

## 2015-02-20 ENCOUNTER — Ambulatory Visit: Payer: Self-pay | Attending: Internal Medicine | Admitting: Internal Medicine

## 2015-02-20 ENCOUNTER — Encounter: Payer: Self-pay | Admitting: Internal Medicine

## 2015-02-20 ENCOUNTER — Ambulatory Visit: Payer: Self-pay | Admitting: Pharmacist

## 2015-02-20 VITALS — BP 114/71 | HR 70 | Temp 98.1°F | Resp 20 | Ht 71.0 in | Wt 300.2 lb

## 2015-02-20 DIAGNOSIS — N528 Other male erectile dysfunction: Secondary | ICD-10-CM | POA: Insufficient documentation

## 2015-02-20 DIAGNOSIS — I10 Essential (primary) hypertension: Secondary | ICD-10-CM | POA: Insufficient documentation

## 2015-02-20 DIAGNOSIS — F17210 Nicotine dependence, cigarettes, uncomplicated: Secondary | ICD-10-CM | POA: Insufficient documentation

## 2015-02-20 DIAGNOSIS — E119 Type 2 diabetes mellitus without complications: Secondary | ICD-10-CM | POA: Insufficient documentation

## 2015-02-20 LAB — POCT GLYCOSYLATED HEMOGLOBIN (HGB A1C): Hemoglobin A1C: 6.6

## 2015-02-20 LAB — GLUCOSE, POCT (MANUAL RESULT ENTRY): POC GLUCOSE: 91 mg/dL (ref 70–99)

## 2015-02-20 MED ORDER — LISINOPRIL-HYDROCHLOROTHIAZIDE 10-12.5 MG PO TABS: 1.0000 | ORAL_TABLET | Freq: Every day | ORAL | Status: DC

## 2015-02-20 MED ORDER — SILDENAFIL CITRATE 50 MG PO TABS: 50.0000 mg | ORAL_TABLET | Freq: Every day | ORAL | Status: DC | PRN

## 2015-02-20 MED ORDER — METFORMIN HCL ER 500 MG PO TB24: 1000.0000 mg | ORAL_TABLET | Freq: Two times a day (BID) | ORAL | Status: DC

## 2015-02-20 MED ORDER — NICOTINE 21 MG/24HR TD PT24: 21.0000 mg | MEDICATED_PATCH | Freq: Every day | TRANSDERMAL | Status: DC

## 2015-02-20 NOTE — Progress Notes (Signed)
Patient ID: Luis Larson, male   DOB: [birth year], 59 y.o.   MRN: [id number]   Luis Larson, is a 59 y.o. male  EPP:[id number]  SAY:[id number]  DOB - Dec 11, 1955  No chief complaint on file.       Subjective:   Luis Larson is a 59 y.o. male with history of essential hypertension, diabetes mellitus without complications, excessive nicotine abuse and erectile dysfunction here today for a follow up visit. Patient is currently complaining of tingling and numbness in his left leg and left arm and some part of his toenails, his diabetes is well controlled making it unlikely to be the cause of numbness, but he has increased his smoking from 1 pack per day to 1-1/2-2 packs per day in the last 5 months. Patient thinks is about time to quit because he begins to have shortness of breath, chronic cough, not productive, and some occasional wheezes. He is requesting a prescription for nicotine patches. He also needs refill on his medications. He has no other complaints, he denies being depressed, he denies any suicidal ideation or thoughts. Patient has No headache, No chest pain, No abdominal pain - No Nausea.  Problem  Heavy Cigarette Smoker (20-39 Per Day)    ALLERGIES: No Known Allergies  PAST MEDICAL HISTORY: Past Medical History  Diagnosis Date  . Essential hypertension, benign 12/22/2012    MEDICATIONS AT HOME: Prior to Admission medications   Medication Sig Start Date End Date Taking? Authorizing Provider  glucose blood test strip Use as instructed 06/06/13  Yes Richarda Overlie, MD  glucose monitoring kit (FREESTYLE) monitoring kit 1 each by Does not apply route as needed for other. 06/06/13  Yes Richarda Overlie, MD  lisinopril-hydrochlorothiazide (PRINZIDE,ZESTORETIC) 10-12.5 MG tablet Take 1 tablet by mouth daily. 02/20/15  Yes Quentin Angst, MD  metFORMIN (GLUCOPHAGE XR) 500 MG 24 hr tablet Take 2 tablets (1,000 mg total) by mouth 2 (two) times daily with a meal. 02/20/15  Yes Quentin Angst, MD  neomycin-polymyxin-hydrocortisone (CORTISPORIN) otic solution Place 4 drops into both ears 3 (three) times daily. Patient not taking: Reported on 02/20/2015 09/16/14   Quentin Angst, MD  nicotine (NICODERM CQ - DOSED IN MG/24 HOURS) 21 mg/24hr patch Place 1 patch (21 mg total) onto the skin daily. 02/20/15   Quentin Angst, MD  sildenafil (VIAGRA) 50 MG tablet Take 1 tablet (50 mg total) by mouth daily as needed for erectile dysfunction. 02/20/15   Quentin Angst, MD     Objective:   Filed Vitals:   02/20/15 1425  BP: 114/71  Pulse: 70  Temp: 98.1 F (36.7 C)  TempSrc: Oral  Resp: 20  Height: 5\' 11"  (1.803 m)  Weight: 300 lb 3.2 oz (136.17 kg)  SpO2: 94%    Exam General appearance : Awake, alert, not in any distress. Speech Clear. Not toxic looking HEENT: Atraumatic and Normocephalic, pupils equally reactive to light and accomodation Neck: supple, no JVD. No cervical lymphadenopathy.  Chest: Reduced air entry bilaterally, few scattered wheezes bilaterally CVS: S1 S2 regular, no murmurs.  Abdomen: Bowel sounds present, Non tender and not distended with no gaurding, rigidity or rebound. Extremities: B/L Lower Ext shows no edema, both legs are warm to touch Neurology: Awake alert, and oriented X 3, CN II-XII intact, Non focal   Data Review Lab Results  Component Value Date   HGBA1C 6.60 02/20/2015   HGBA1C 6.20 09/16/2014   HGBA1C 6.1 02/04/2014     Assessment & Plan  1. Type 2 diabetes mellitus without complication, without long-term current use of insulin (HCC)  - POCT A1C - Glucose (CBG) - metFORMIN (GLUCOPHAGE XR) 500 MG 24 hr tablet; Take 2 tablets (1,000 mg total) by mouth 2 (two) times daily with a meal.  Dispense: 180 tablet; Refill: 3  Aim for 30 minutes of exercise most days. Rethink what you drink. Water is great! Aim for 2-3 Carb Choices per meal (30-45 grams) +/- 1 either way  Aim for 0-15 Carbs per snack if hungry  Include  protein in moderation with your meals and snacks  Consider reading food labels for Total Carbohydrate and Fat Grams of foods  Consider checking BG at alternate times per day  Continue taking medication as directed Be mindful about how much sugar you are adding to beverages and other foods. Fruit Punch - find one with no sugar  Measure and decrease portions of carbohydrate foods  Make your plate and don't go back for seconds   2. Essential hypertension, benign  - lisinopril-hydrochlorothiazide (PRINZIDE,ZESTORETIC) 10-12.5 MG tablet; Take 1 tablet by mouth daily.  Dispense: 90 tablet; Refill: 3  We have discussed target BP range and blood pressure goal. I have advised patient to check BP regularly and to call us back or report to clinic if the numbers are consistently higher than 140/90. We discussed the importance of compliance with medical therapy and DASH diet recommended, consequences of uncontrolled hypertension discussed.   - continue current BP medications  3. Other male erectile dysfunction  - sildenafil (VIAGRA) 50 MG tablet; Take 1 tablet (50 mg total) by mouth daily as needed for erectile dysfunction.  Dispense: 30 tablet; Refill: 3  4. Heavy cigarette smoker (20-39 per day)  Patient is referred to her CPP for smoking cessation counseling - nicotine (NICODERM CQ - DOSED IN MG/24 HOURS) 21 mg/24hr patch; Place 1 patch (21 mg total) onto the skin daily.  Dispense: 28 patch; Refill: 3  Luis Larson was counseled on the dangers of tobacco use, and was advised to quit. Reviewed strategies to maximize success, including removing cigarettes and smoking materials from environment, stress management and support of family/friends.   Patient have been counseled extensively about nutrition and exercise  Return in about 6 months (around 08/20/2015) for Annual Physical, COPD.  The patient was given clear instructions to go to ER or return to medical center if symptoms don't improve, worsen or  new problems develop. The patient verbalized understanding. The patient was told to call to get lab results if they haven't heard anything in the next week.   This note has been created with Education officer, environmental. Any transcriptional errors are unintentional.    Jeanann Lewandowsky, MD, MHA, FACP, FAAP, CPE [facility] [place], [place] [phone]   02/20/2015, 3:22 PM

## 2015-02-20 NOTE — Progress Notes (Signed)
S:  Patient arrives for a visit with Dr. Hyman HopesJegede.   Patient arrives for evaluation/assistance with tobacco dependence.   Age when started using tobacco on a daily basis 14. Number of Cigarettes per day 20-40. Smokes first cigarette <5 minutes after waking. Denies waking to smoke  Most recent quit attempt 17 years ago (around 2000) Longest time ever been tobacco free 7 months.  Medications (NRT, bupropion, varenicline) used in prior in past cessation efforts include: nicotine patch.   Rates IMPORTANCE of quitting tobacco on 1-10 scale of 10. Rates CONFIDENCE of quitting tobacco on 1-10 scale of 10.  Most common triggers to use tobacco include; boredom - he reports that he smokes a lot less if he is working on something.  Motivation to quit: health  A/P: severe Nicotine Dependence of about 45 years duration in a patient who is fair candidate for success b/c of motivation to quit and previous attempt at quitting resulting in 7 months of no tobacco use.     Dr. Hyman HopesJegede ordered nicotine replacement patches. Will assist in down-titrating the nicotine patch if patient successful in tobacco cessation. Patient counseled on purpose, proper use, and potential adverse effects, including skin irritation and difficulty sleeping. Also encouraged patient to use cinnamon sticks or sugar-free candies when he has a craving while on the nicotine patch. Discussed the importance of finding projects or hobbies to distract him.  Written information provided.  F/U phone call in 2 weeks.  F/U Rx Clinic Visit as needed   Total time in face-to-face counseling 10 minutes.

## 2015-02-20 NOTE — Patient Instructions (Signed)
Obesity Obesity is defined as having too much total body fat and a body mass index (BMI) of 30 or more. BMI is an estimate of body fat and is calculated from your height and weight. BMI is typically calculated by your health care provider during regular wellness visits. Obesity happens when you consume more calories than you can burn by exercising or performing daily physical tasks. Prolonged obesity can cause major illnesses or emergencies, such as:  Stroke.  Heart disease.  Diabetes.  Cancer.  Arthritis.  High blood pressure (hypertension).  High cholesterol.  Sleep apnea.  Erectile dysfunction.  Infertility problems. CAUSES   Regularly eating unhealthy foods.  Physical inactivity.  Certain disorders, such as an underactive thyroid (hypothyroidism), Cushing's syndrome, and polycystic ovarian syndrome.  Certain medicines, such as steroids, some depression medicines, and antipsychotics.  Genetics.  Lack of sleep. DIAGNOSIS A health care provider can diagnose obesity after calculating your BMI. Obesity will be diagnosed if your BMI is 30 or higher. There are other methods of measuring obesity levels. Some other methods include measuring your skinfold thickness, your waist circumference, and comparing your hip circumference to your waist circumference. TREATMENT  A healthy treatment program includes some or all of the following:  Long-term dietary changes.  Exercise and physical activity.  Behavioral and lifestyle changes.  Medicine only under the supervision of your health care provider. Medicines may help, but only if they are used with diet and exercise programs. If your BMI is 40 or higher, your health care provider may recommend specialized surgery or programs to help with weight loss. An unhealthy treatment program includes:  Fasting.  Fad diets.  Supplements and drugs. These choices do not succeed in long-term weight control. HOME CARE  INSTRUCTIONS  Exercise and perform physical activity as directed by your health care provider. To increase physical activity, try the following:  Use stairs instead of elevators.  Park farther away from store entrances.  Garden, bike, or walk instead of watching television or using the computer.  Eat healthy, low-calorie foods and drinks on a regular basis. Eat more fruits and vegetables. Use low-calorie cookbooks or take healthy cooking classes.  Limit fast food, sweets, and processed snack foods.  Eat smaller portions.  Keep a daily journal of everything you eat. There are many free websites to help you with this. It may be helpful to measure your foods so you can determine if you are eating the correct portion sizes.  Avoid drinking alcohol. Drink more water and drinks without calories.  Take vitamins and supplements only as recommended by your health care provider.  Weight-loss support groups, Government social research officer, counselors, and stress reduction education can also be very helpful. SEEK IMMEDIATE MEDICAL CARE IF:  You have chest pain or tightness.  You have trouble breathing or feel short of breath.  You have weakness or leg numbness.  You feel confused or have trouble talking.  You have sudden changes in your vision.   This information is not intended to replace advice given to you by your health care provider. Make sure you discuss any questions you have with your health care provider.   Document Released: 05/06/2004 Document Revised: 04/19/2014 Document Reviewed: 05/05/2011 Elsevier Interactive Patient Education 2016 [employer]. Smoking Cessation, Tips for Success If you are ready to quit smoking, congratulations! You have chosen to help yourself be healthier. Cigarettes bring nicotine, tar, carbon monoxide, and other irritants into your body. Your lungs, heart, and blood vessels will be able to work better  without these poisons. There are many different ways to  quit smoking. Nicotine gum, nicotine patches, a nicotine inhaler, or nicotine nasal spray can help with physical craving. Hypnosis, support groups, and medicines help break the habit of smoking. WHAT THINGS CAN I DO TO MAKE QUITTING EASIER?  Here are some tips to help you quit for good:  Pick a date when you will quit smoking completely. Tell all of your friends and family about your plan to quit on that date.  Do not try to slowly cut down on the number of cigarettes you are smoking. Pick a quit date and quit smoking completely starting on that day.  Throw away all cigarettes.   Clean and remove all ashtrays from your home, work, and car.  On a card, write down your reasons for quitting. Carry the card with you and read it when you get the urge to smoke.  Cleanse your body of nicotine. Drink enough water and fluids to keep your urine clear or pale yellow. Do this after quitting to flush the nicotine from your body.  Learn to predict your moods. Do not let a bad situation be your excuse to have a cigarette. Some situations in your life might tempt you into wanting a cigarette.  Never have "just one" cigarette. It leads to wanting another and another. Remind yourself of your decision to quit.  Change habits associated with smoking. If you smoked while driving or when feeling stressed, try other activities to replace smoking. Stand up when drinking your coffee. Brush your teeth after eating. Sit in a different chair when you read the paper. Avoid alcohol while trying to quit, and try to drink fewer caffeinated beverages. Alcohol and caffeine may urge you to smoke.  Avoid foods and drinks that can trigger a desire to smoke, such as sugary or spicy foods and alcohol.  Ask people who smoke not to smoke around you.  Have something planned to do right after eating or having a cup of coffee. For example, plan to take a walk or exercise.  Try a relaxation exercise to calm you down and decrease  your stress. Remember, you may be tense and nervous for the first 2 weeks after you quit, but this will pass.  Find new activities to keep your hands busy. Play with a pen, coin, or rubber band. Doodle or draw things on paper.  Brush your teeth right after eating. This will help cut down on the craving for the taste of tobacco after meals. You can also try mouthwash.   Use oral substitutes in place of cigarettes. Try using lemon drops, carrots, cinnamon sticks, or chewing gum. Keep them handy so they are available when you have the urge to smoke.  When you have the urge to smoke, try deep breathing.  Designate your home as a nonsmoking area.  If you are a heavy smoker, ask your health care provider about a prescription for nicotine chewing gum. It can ease your withdrawal from nicotine.  Reward yourself. Set aside the cigarette money you save and buy yourself something nice.  Look for support from others. Join a support group or smoking cessation program. Ask someone at home or at work to help you with your plan to quit smoking.  Always ask yourself, "Do I need this cigarette or is this just a reflex?" Tell yourself, "Today, I choose not to smoke," or "I do not want to smoke." You are reminding yourself of your decision to quit.  Do  not replace cigarette smoking with electronic cigarettes (commonly called e-cigarettes). The safety of e-cigarettes is unknown, and some may contain harmful chemicals.  If you relapse, do not give up! Plan ahead and think about what you will do the next time you get the urge to smoke. HOW WILL I FEEL WHEN I QUIT SMOKING? You may have symptoms of withdrawal because your body is used to nicotine (the addictive substance in cigarettes). You may crave cigarettes, be irritable, feel very hungry, cough often, get headaches, or have difficulty concentrating. The withdrawal symptoms are only temporary. They are strongest when you first quit but will go away within 10-14  days. When withdrawal symptoms occur, stay in control. Think about your reasons for quitting. Remind yourself that these are signs that your body is healing and getting used to being without cigarettes. Remember that withdrawal symptoms are easier to treat than the major diseases that smoking can cause.  Even after the withdrawal is over, expect periodic urges to smoke. However, these cravings are generally short lived and will go away whether you smoke or not. Do not smoke! WHAT RESOURCES ARE AVAILABLE TO HELP ME QUIT SMOKING? Your health care provider can direct you to community resources or hospitals for support, which may include:  Group support.  Education.  Hypnosis.  Therapy.   This information is not intended to replace advice given to you by your health care provider. Make sure you discuss any questions you have with your health care provider.   Document Released: 12/26/2003 Document Revised: 04/19/2014 Document Reviewed: 09/14/2012 Elsevier Interactive Patient Education [employer] Elsevier Inc.

## 2015-02-20 NOTE — Progress Notes (Signed)
Patient here for F/U DM, HTN  Patient denies pain at this time. Patient complains of left leg and arm tingling and numbness.  Patient requesting nicotine patch, patient has increased smoking from 1 pack/day to 1.5/2 packs a day.

## 2015-04-28 MED FILL — METFORMIN HCL ER 500 MG TAB: 500 | 30 days supply | Qty: 120 | Fill #2

## 2015-04-28 MED FILL — LISINOPRIL-HCTZ 10-12.5 MG: 10-12.5 | 30 days supply | Qty: 30 | Fill #2

## 2015-08-10 IMAGING — US US ABDOMEN COMPLETE
1 series · 14 of 25 positions shown · non-contrast
Comparison: None

CLINICAL DATA: Right upper quadrant pain.

EXAM:
ABDOMEN ULTRASOUND

[Series 1: us abdomen complete · 0.27mm/px · 14 of 98 slices shown]
[im 1/98]
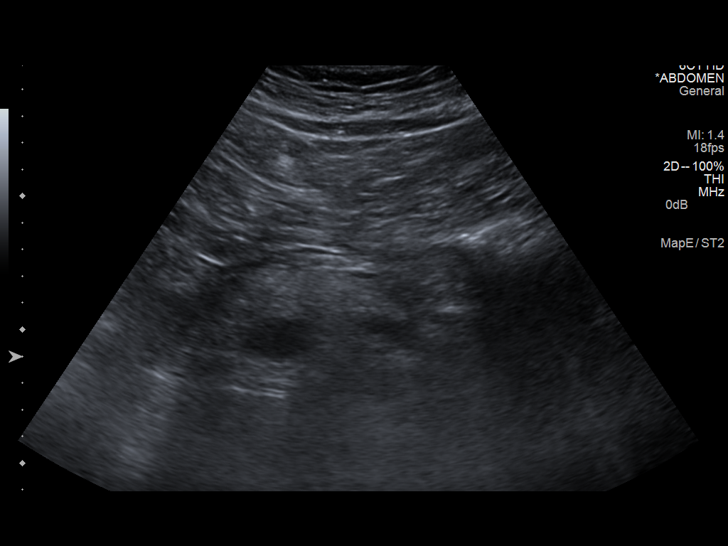
[im 9/98]
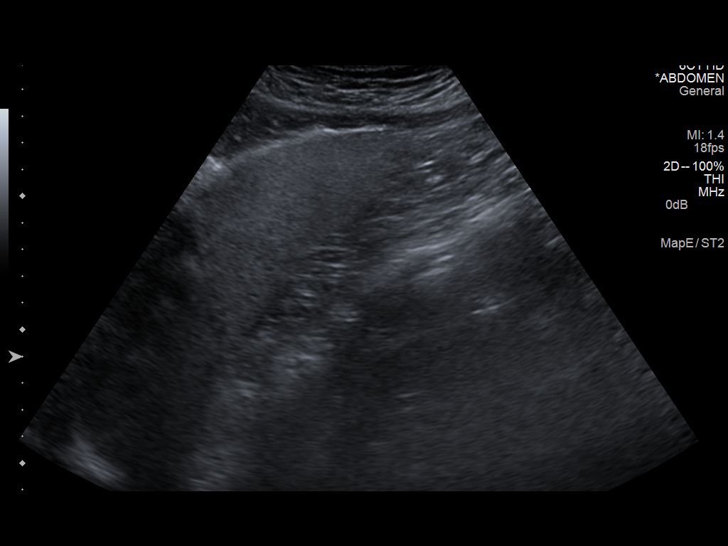
[im 17/98]
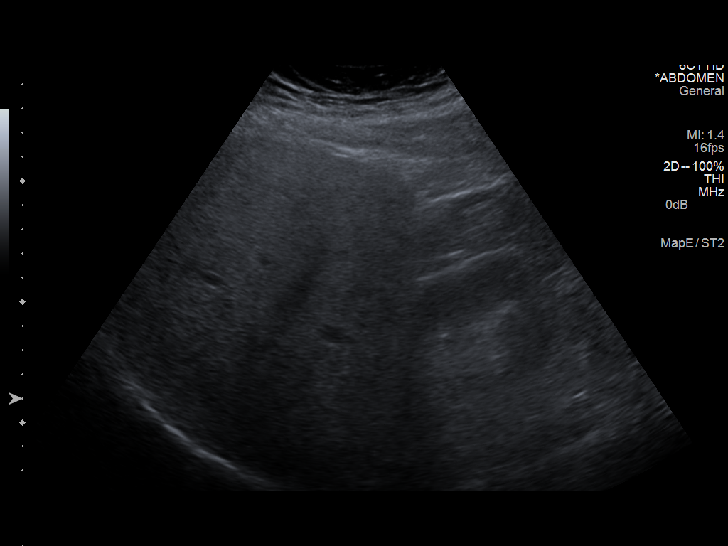
[im 25/98]
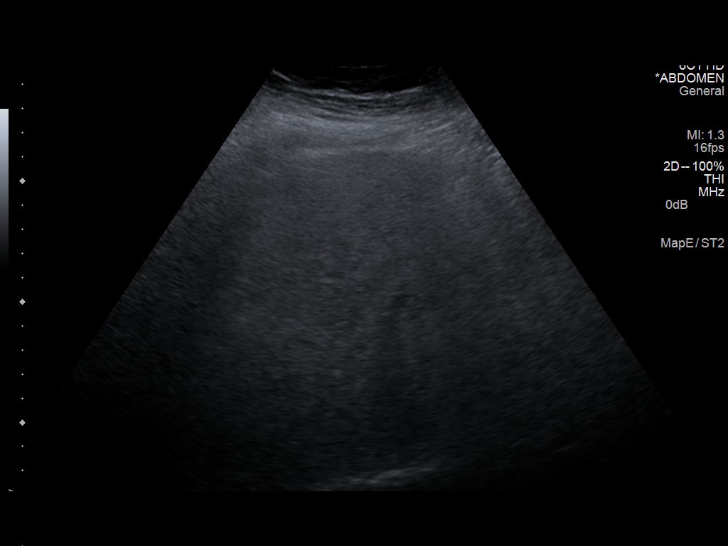
[im 33/98]
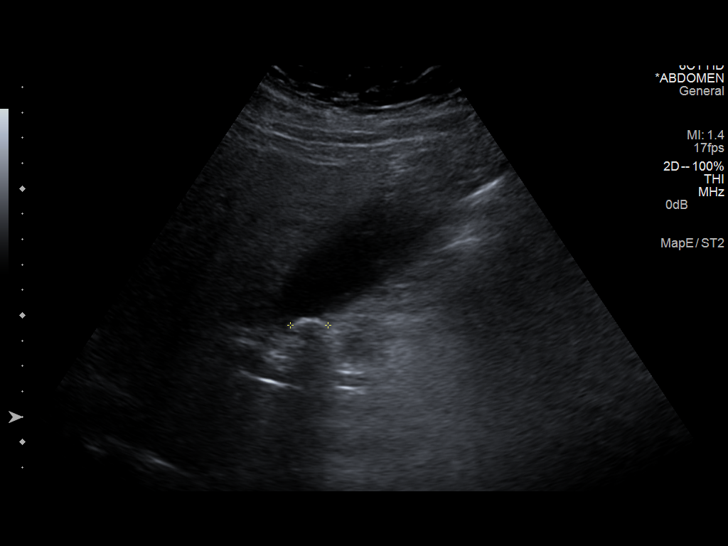
[im 37/98]
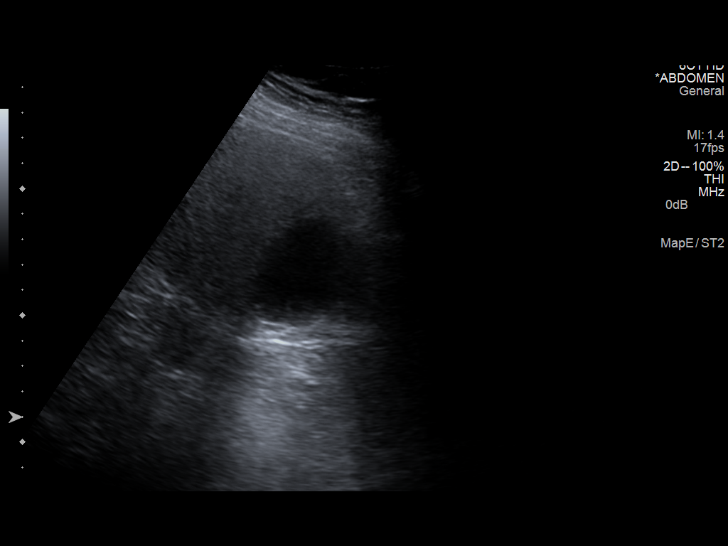
[im 45/98]
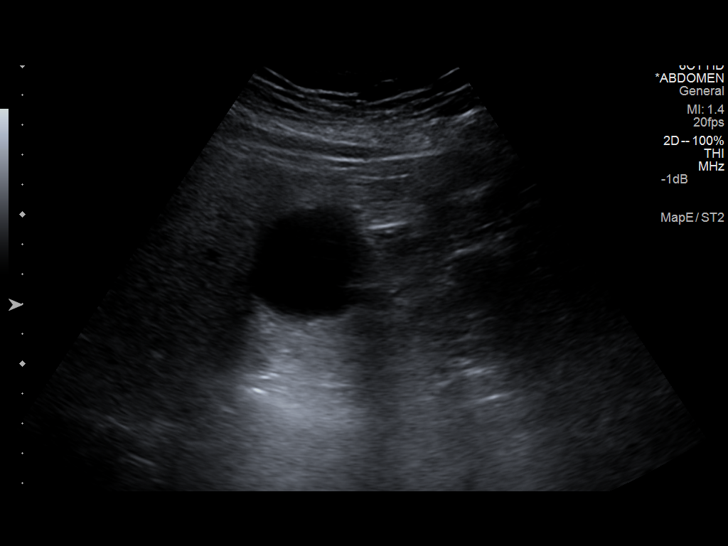
[im 53/98]
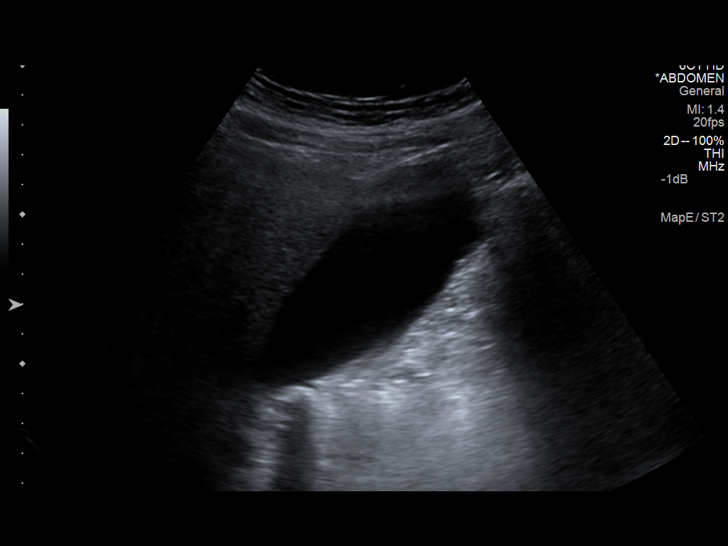
[im 61/98]
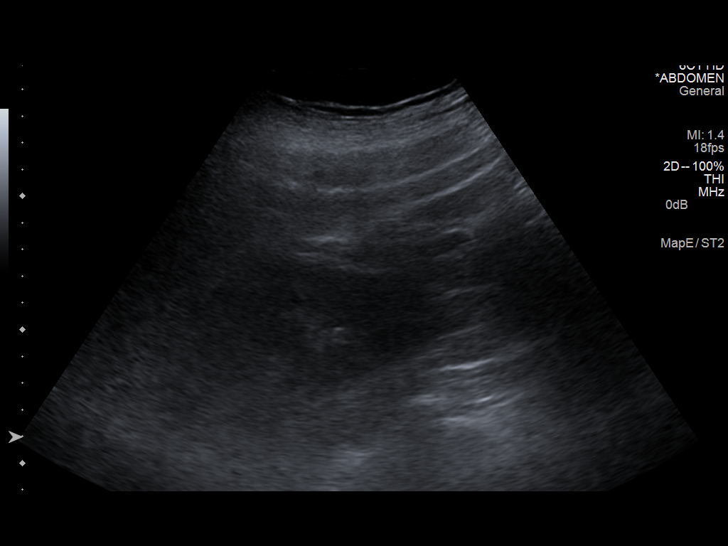
[im 65/98]
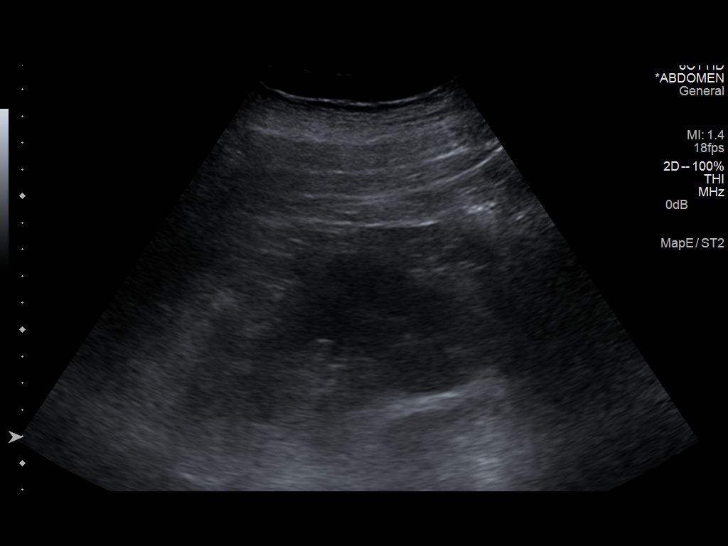
[im 73/98]
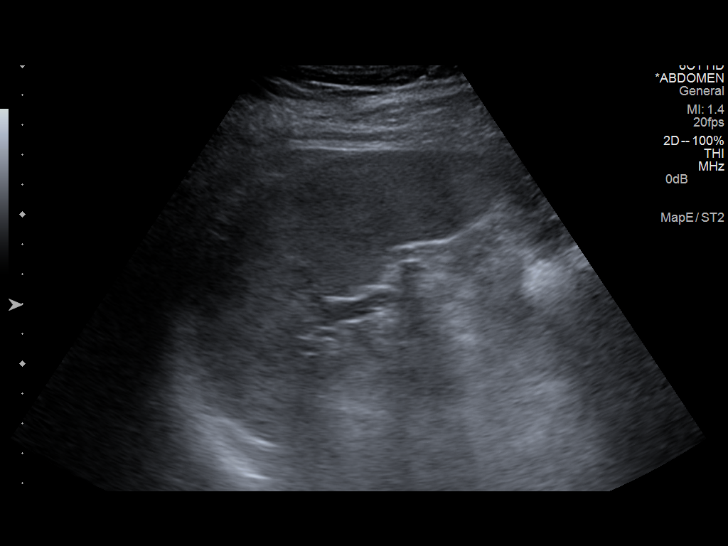
[im 81/98]
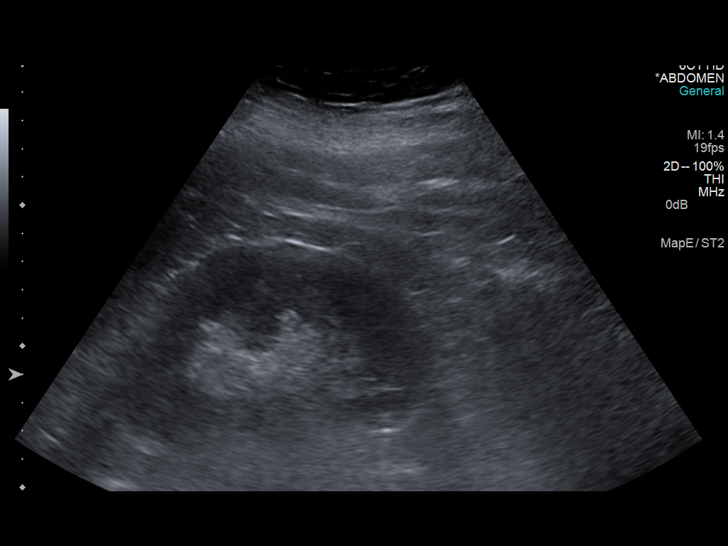
[im 89/98]
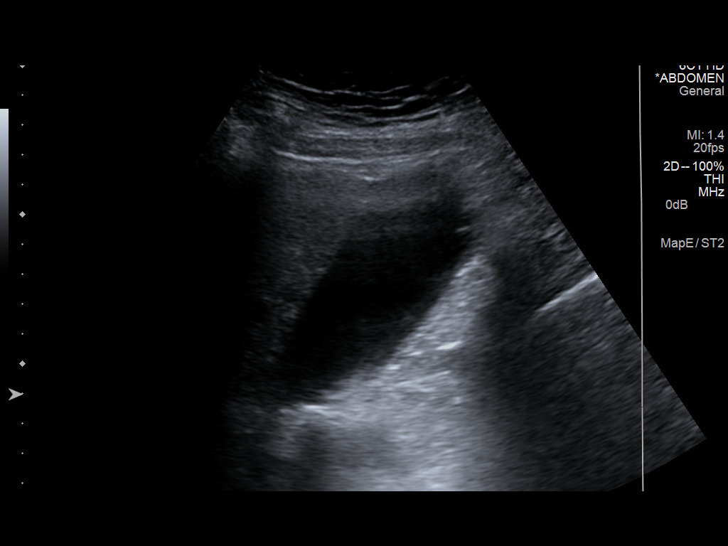
[im 98/98]
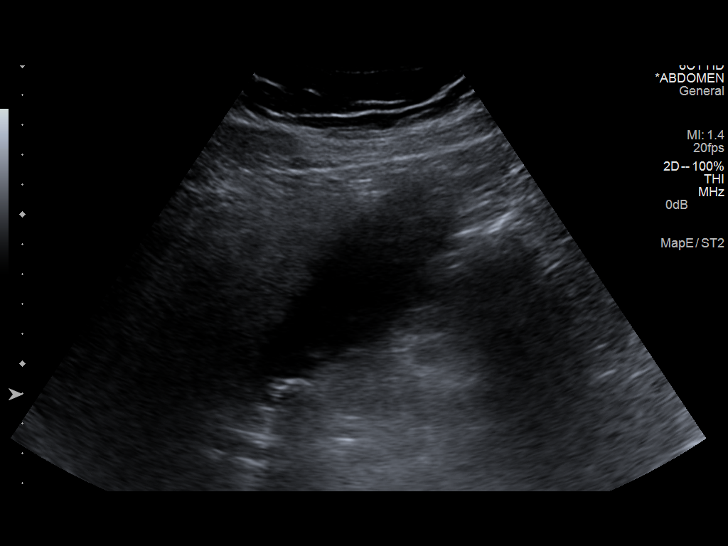

[14 of 25 positions shown; findings below may reference images not displayed]

FINDINGS: Gallbladder

1.5 cm gallstone in the region of the gallbladder neck. This was non
mobile. No wall thickening. Negative sonographic Nya

Common bile duct

Diameter: Normal caliber, 6 mm.

Liver

Increased echotexture throughout the liver suggesting fatty
infiltration. No visible focal abnormality.

IVC

No abnormality visualized.

Pancreas

Visualized portion unremarkable.

Spleen

Size and appearance within normal limits.

Right Kidney

Length: 12.5 cm. Echogenicity within normal limits. No mass or
hydronephrosis visualized.

Left Kidney

Length: 12.2 cm. Echogenicity within normal limits. No mass or
hydronephrosis visualized.

Abdominal aorta

No aneurysm visualized.
IMPRESSION: 1.5 cm gallstone in the region of the gallbladder neck which is non
mobile. No sonographic changes of acute cholecystitis currently.

## 2016-03-15 ENCOUNTER — Encounter (HOSPITAL_BASED_OUTPATIENT_CLINIC_OR_DEPARTMENT_OTHER): Payer: Self-pay | Admitting: *Deleted

## 2016-03-15 ENCOUNTER — Emergency Department (HOSPITAL_BASED_OUTPATIENT_CLINIC_OR_DEPARTMENT_OTHER)
Admission: EM | Admit: 2016-03-15 | Discharge: 2016-03-15 | Disposition: A | Discharge status: Home / Self Care | Payer: Self-pay | Attending: Emergency Medicine | Admitting: Emergency Medicine

## 2016-03-15 DIAGNOSIS — E119 Type 2 diabetes mellitus without complications: Secondary | ICD-10-CM | POA: Insufficient documentation

## 2016-03-15 DIAGNOSIS — H6093 Unspecified otitis externa, bilateral: Secondary | ICD-10-CM

## 2016-03-15 DIAGNOSIS — J029 Acute pharyngitis, unspecified: Secondary | ICD-10-CM

## 2016-03-15 DIAGNOSIS — I10 Essential (primary) hypertension: Secondary | ICD-10-CM | POA: Insufficient documentation

## 2016-03-15 DIAGNOSIS — F17210 Nicotine dependence, cigarettes, uncomplicated: Secondary | ICD-10-CM | POA: Insufficient documentation

## 2016-03-15 DIAGNOSIS — Z7984 Long term (current) use of oral hypoglycemic drugs: Secondary | ICD-10-CM | POA: Insufficient documentation

## 2016-03-15 DIAGNOSIS — H608X3 Other otitis externa, bilateral: Secondary | ICD-10-CM | POA: Insufficient documentation

## 2016-03-15 DIAGNOSIS — Z79899 Other long term (current) drug therapy: Secondary | ICD-10-CM | POA: Insufficient documentation

## 2016-03-15 MED ORDER — NEOMYCIN-POLYMYXIN-HC 3.5-10000-1 OT SOLN: 4.0000 [drp] | Freq: Three times a day (TID) | OTIC | 0 refills | Status: DC

## 2016-03-15 MED FILL — NEO/POLYMYXIN/HC EAR SOLN: 3.5-10000-1 | 8 days supply | Qty: 10 | Fill #0

## 2016-03-15 NOTE — ED Provider Notes (Signed)
[facility] Provider Note   CSN: [id number] Arrival date & time: 03/15/16  1335  By signing my name below, I, Luis Larson, attest that this documentation has been prepared under the direction and in the presence of [name], PA-C. Electronically Signed: Doran Larson, ED Scribe. 03/15/16. 4:07 PM.  History   Chief Complaint Chief Complaint  Patient presents with  . Otalgia  . Sore Throat   The history is provided by the patient. No language interpreter was used.   HPI Comments: Luis Larson is a 60 y.o. male who presents to the Emergency Department with a PMHx of DM and HTN complaining of sudden onset of constant left-sided ear pain for the past 3 days. Pt also reports subjective fever, rhinorrhea, left sided facial pain, HA and 3 months of sore throat. Pt states his ear pain and associated ringing is exacerbated when lying on his left side and throat pain when he swallows. Pt states he keeps his ear clean by frequently using peroxide to clean them. Pt does not use Q-tips to clean his ears. Pt denies any CP, SOB, N/V/D ear drainage, or any other symptoms at this time. NKDA. Pt smokes Cigars  Past Medical History:  Diagnosis Date  . Essential hypertension, benign 12/22/2012   Patient Active Problem List   Diagnosis Date Noted  . Heavy cigarette smoker (20-39 per day) 02/20/2015  . Excess ear wax 09/16/2014  . DM (diabetes mellitus) type 2, uncontrolled, with ketoacidosis ([place]) 10/04/2013  . Other male erectile dysfunction 10/04/2013  . Colon cancer screening 10/04/2013  . Diabetes ([place]) 12/22/2012  . Abdominal pain, right upper quadrant 12/22/2012  . Essential hypertension, benign 12/22/2012  . Cough 12/22/2012  . Type II or unspecified type diabetes mellitus without mention of complication, uncontrolled 10/17/2012  . Ear infection 10/17/2012    History reviewed. No pertinent surgical history.   Home Medications    Prior to Admission medications     Medication Sig Start Date End Date Taking? Authorizing Provider  glucose blood test strip Use as instructed 06/06/13   Reyne Dumas, MD  glucose monitoring kit (FREESTYLE) monitoring kit 1 each by Does not apply route as needed for other. 06/06/13   Reyne Dumas, MD  lisinopril-hydrochlorothiazide (PRINZIDE,ZESTORETIC) 10-12.5 MG tablet Take 1 tablet by mouth daily. 02/20/15   Tresa Garter, MD  metFORMIN (GLUCOPHAGE XR) 500 MG 24 hr tablet Take 2 tablets (1,000 mg total) by mouth 2 (two) times daily with a meal. 02/20/15   Tresa Garter, MD  neomycin-polymyxin-hydrocortisone (CORTISPORIN) otic solution Place 4 drops into both ears 3 (three) times daily. Patient not taking: Reported on 02/20/2015 09/16/14   Tresa Garter, MD  nicotine (NICODERM CQ - DOSED IN MG/24 HOURS) 21 mg/24hr patch Place 1 patch (21 mg total) onto the skin daily. 02/20/15   Tresa Garter, MD  sildenafil (VIAGRA) 50 MG tablet Take 1 tablet (50 mg total) by mouth daily as needed for erectile dysfunction. 02/20/15   Tresa Garter, MD    Family History Family History  Problem Relation Age of Onset  . Diabetes Mother   . Heart disease Mother   . Heart disease Father     Social History Social History  Substance Use Topics  . Smoking status: Current Every Day Smoker    Packs/day: 2.00    Types: Cigarettes  . Smokeless tobacco: Never Used     Comment: states he cut back to 1 pack per day  . Alcohol use No  Allergies   Patient has no known allergies.   Review of Systems Review of Systems  Constitutional: Positive for fever.  HENT: Positive for ear pain, rhinorrhea and sore throat. Negative for ear discharge.   Respiratory: Negative for shortness of breath.   Cardiovascular: Negative for chest pain.  Gastrointestinal: Negative for diarrhea, nausea and vomiting.  Neurological: Positive for headaches.   Physical Exam Updated Vital Signs BP 178/91   Pulse 71   Temp 98.1 F (36.7  C) (Oral)   Resp 20   Ht _0  (1.803 m)   Wt 290 lb (131.5 kg)   SpO2 96%   BMI 40.45 kg/m   Physical Exam  Constitutional: He is oriented to person, place, and time. He appears well-developed and well-nourished.  HENT:  Head: Normocephalic.  Mouth/Throat: Uvula is midline, oropharynx is clear and moist and mucous membranes are normal. No trismus in the jaw. No uvula swelling. No tonsillar exudate.  Bilateral ear canal erythematous and TTP. TM difficult to exam due to pain. Boggy turbinates. Normal phonation. No thyromegaly. No mastoiditis.   Eyes: Conjunctivae are normal.  Cardiovascular: Normal rate.   Pulmonary/Chest: Effort normal.  Lymphadenopathy:    He has no cervical adenopathy.  Neurological: He is alert and oriented to person, place, and time.  Skin: Skin is warm and dry.  Psychiatric: He has a normal mood and affect.  Nursing note and vitals reviewed.  ED Treatments / Results  DIAGNOSTIC STUDIES: Oxygen Saturation is 96% on room air, normal by my interpretation.    COORDINATION OF CARE: 4:07 PM Discussed treatment plan with pt at bedside and pt agreed to plan.  Labs (all labs ordered are listed, but only abnormal results are displayed) Labs Reviewed - No data to display  EKG  EKG Interpretation None       Radiology No results found.  Procedures Procedures (including critical care time)  Medications Ordered in ED Medications - No data to display   Initial Impression / Assessment and Plan / ED Course  I have reviewed the triage vital signs and the nursing notes.  Pertinent labs & imaging results that were available during my care of the patient were reviewed by me and considered in my medical decision making (see chart for details).  Clinical Course    MDM Number of Diagnoses or Management Options Recurrent otitis externa, bilateral:    BP 178/91   Pulse 71   Temp 98.1 F (36.7 C) (Oral)   Resp 20   Ht _1  (1.803 m)   Wt 131.5 kg    SpO2 96%   BMI 40.45 kg/m    Final Clinical Impressions(s) / ED Diagnoses   Final diagnoses:  Recurrent otitis externa, bilateral  Sore throat    New Prescriptions Current Discharge Medication List     I personally performed the services described in this documentation, which was scribed in my presence. The recorded information has been reviewed and is accurate.   4:40 PM Pt here with bilateral ear pain L>R.  Finding suggestive of otitis externa.  Will prescribe cortisporin ear drops.  He report throat discomfort x 3-4 months.  Is a cigar smoker.  Throat exam unremarkable.  Normal phonation, no airway involvement.  Neck exam unremarkable.  Recommend f/u with ENT for further care.  Return precaution discussed.     [name], PA-C 03/15/16 [place], MD 03/16/16 [phone]

## 2016-03-15 NOTE — ED Triage Notes (Addendum)
Sore throat x 4 months. He thinks he has an ear infection. Strep screen was offered. Pt refused. States he had a negative strep screen 4 months ago and was told he could have throat cancer. He never made a follow up appointment.

## 2016-03-15 NOTE — Discharge Instructions (Signed)
Please use cortisporin ear drops as prescribed as treatment for outer ear infection.  Follow up with ENT specialist for further management of your persistent throat discomfort.

## 2017-09-16 ENCOUNTER — Ambulatory Visit: Payer: Self-pay | Attending: Internal Medicine | Admitting: Internal Medicine

## 2017-09-16 ENCOUNTER — Encounter: Payer: Self-pay | Admitting: Internal Medicine

## 2017-09-16 VITALS — BP 178/82 | HR 59 | Temp 98.0°F | Resp 16 | Ht 71.0 in | Wt 302.6 lb

## 2017-09-16 DIAGNOSIS — E1165 Type 2 diabetes mellitus with hyperglycemia: Secondary | ICD-10-CM

## 2017-09-16 DIAGNOSIS — Z8249 Family history of ischemic heart disease and other diseases of the circulatory system: Secondary | ICD-10-CM | POA: Insufficient documentation

## 2017-09-16 DIAGNOSIS — Z79899 Other long term (current) drug therapy: Secondary | ICD-10-CM | POA: Insufficient documentation

## 2017-09-16 DIAGNOSIS — F17200 Nicotine dependence, unspecified, uncomplicated: Secondary | ICD-10-CM

## 2017-09-16 DIAGNOSIS — Z833 Family history of diabetes mellitus: Secondary | ICD-10-CM | POA: Insufficient documentation

## 2017-09-16 DIAGNOSIS — I10 Essential (primary) hypertension: Secondary | ICD-10-CM

## 2017-09-16 DIAGNOSIS — N529 Male erectile dysfunction, unspecified: Secondary | ICD-10-CM

## 2017-09-16 DIAGNOSIS — IMO0001 Reserved for inherently not codable concepts without codable children: Secondary | ICD-10-CM

## 2017-09-16 DIAGNOSIS — Z125 Encounter for screening for malignant neoplasm of prostate: Secondary | ICD-10-CM

## 2017-09-16 DIAGNOSIS — E1110 Type 2 diabetes mellitus with ketoacidosis without coma: Secondary | ICD-10-CM | POA: Insufficient documentation

## 2017-09-16 DIAGNOSIS — R197 Diarrhea, unspecified: Secondary | ICD-10-CM | POA: Insufficient documentation

## 2017-09-16 LAB — POCT GLYCOSYLATED HEMOGLOBIN (HGB A1C): HBA1C, POC (CONTROLLED DIABETIC RANGE): 9.8 % — AB (ref 0.0–7.0)

## 2017-09-16 LAB — GLUCOSE, POCT (MANUAL RESULT ENTRY): POC Glucose: 230 mg/dl — AB (ref 70–99)

## 2017-09-16 MED ORDER — GLIMEPIRIDE 2 MG PO TABS: 2.0000 mg | ORAL_TABLET | Freq: Every day | ORAL | 3 refills | Status: DC

## 2017-09-16 MED ORDER — SILDENAFIL CITRATE 100 MG PO TABS: ORAL_TABLET | ORAL | 6 refills | Status: DC

## 2017-09-16 MED ORDER — TETANUS-DIPHTH-ACELL PERTUSSIS 5-2.5-18.5 LF-MCG/0.5 IM SUSP: 0.5000 mL | Freq: Once | INTRAMUSCULAR | 0 refills | Status: AC

## 2017-09-16 MED ORDER — METFORMIN HCL 500 MG PO TABS
500.0000 mg | ORAL_TABLET | Freq: Every day | ORAL | 1 refills | Status: DC
Start: 2017-09-16 — End: 2018-04-03

## 2017-09-16 MED ORDER — LISINOPRIL 10 MG PO TABS: 10.0000 mg | ORAL_TABLET | Freq: Every day | ORAL | 3 refills | Status: DC

## 2017-09-16 MED FILL — metFORMIN HCL 500 MG TABS: 500 | 30 days supply | Qty: 30 | Fill #0

## 2017-09-16 MED FILL — GLIMEPIRIDE 2 MG TABS: 2 | 30 days supply | Qty: 30 | Fill #0

## 2017-09-16 MED FILL — !VIAGRA 100 MG TABLET: 100 MG | 30 days supply | Qty: 10 | Fill #0

## 2017-09-16 MED FILL — LISINOPRIL 10 MG TABS: 10 | 30 days supply | Qty: 30 | Fill #0

## 2017-09-16 NOTE — Progress Notes (Signed)
Patient ID: Luis Larson, male    DOB: [birth year]  MRN: [id number]  CC: re-establish; Diabetes; and Hypertension   Subjective: Luis Larson is a 61 y.o. male who presents for chronic ds management. Last seen 2016 His concerns today include:  Pt with hx of HTN, DM, tob dep and ED  DM: Patient has been off of medication for over a year and has not been seen since 2016.  He states that previous PCP had increased the dose of metformin to 1 g twice a day and this caused significant diarrhea for him.  He did not come back because he did not want that physician to know that he stopped taking the medication. Checks BS few x a wk.  Gives range 182-312.  Eating habits: trying to stay away from everything white.  Drinks green tea and water. OJ occasional.  Terms of meats he prefers chicken, fish and pork Exercise:  Not getting in any exercise outside of work.  He works Holiday representative.  ED: He is requesting prescription for Viagra.  He was placed on the 25 mg in the past which did not work for him.  He has problems getting and maintaining erections.  Tob dep: 1 pk a day for 40 yrs. he quit once using nicotine patches for about 8 months but restarted because of 30 pound weight gain.  HTN: He has been off of blood pressure medicine for over a year.  He denies any chest pains.  Some shortness of breath at times which he relates to years of cigarette smoking.  No lower extremity edema.  Patient Active Problem List   Diagnosis Date Noted  . Tobacco dependence 09/16/2017  . DM (diabetes mellitus) type 2, uncontrolled, with ketoacidosis (HCC) 10/04/2013  . Other male erectile dysfunction 10/04/2013  . Colon cancer screening 10/04/2013  . Diabetes (HCC) 12/22/2012  . Essential hypertension 12/22/2012  . Type II or unspecified type diabetes mellitus without mention of complication, uncontrolled 10/17/2012     Current Outpatient Medications on File Prior to Visit  Medication Sig Dispense Refill  .  glucose blood test strip Use as instructed 100 each 12  . glucose monitoring kit (FREESTYLE) monitoring kit 1 each by Does not apply route as needed for other. 1 each 2   No current facility-administered medications on file prior to visit.     No Known Allergies  Social History   Socioeconomic History  . Marital status: Divorced    Spouse name: Not on file  . Number of children: Not on file  . Years of education: Not on file  . Highest education level: Not on file  Occupational History  . Not on file  Social Needs  . Financial resource strain: Not on file  . Food insecurity:    Worry: Not on file    Inability: Not on file  . Transportation needs:    Medical: Not on file    Non-medical: Not on file  Tobacco Use  . Smoking status: Current Every Day Smoker    Packs/day: 2.00    Types: Cigarettes  . Smokeless tobacco: Never Used  . Tobacco comment: states he cut back to 1 pack per day  Substance and Sexual Activity  . Alcohol use: No  . Drug use: No  . Sexual activity: Not on file  Lifestyle  . Physical activity:    Days per week: Not on file    Minutes per session: Not on file  . Stress: Not on file  Relationships  . Social connections:    Talks on phone: Not on file    Gets together: Not on file    Attends religious service: Not on file    Active member of club or organization: Not on file    Attends meetings of clubs or organizations: Not on file    Relationship status: Not on file  . Intimate partner violence:    Fear of current or ex partner: Not on file    Emotionally abused: Not on file    Physically abused: Not on file    Forced sexual activity: Not on file  Other Topics Concern  . Not on file  Social History Narrative  . Not on file    Family History  Problem Relation Age of Onset  . Diabetes Mother   . Heart disease Mother   . Heart disease Father     No past surgical history on file.  ROS: Review of Systems Negative except as stated  above.  PHYSICAL EXAM: BP (!) 178/82   Pulse (!) 59   Temp 98 F (36.7 C) (Oral)   Resp 16   Ht 5\' 11"  (1.803 m)   Wt (!) 302 lb 9.6 oz (137.3 kg)   SpO2 95%   BMI 42.20 kg/m   Wt Readings from Last 3 Encounters:  09/16/17 (!) 302 lb 9.6 oz (137.3 kg)  03/15/16 290 lb (131.5 kg)  02/20/15 (!) 300 lb 3.2 oz (136.2 kg)    Physical Exam  General appearance - alert, well appearing, and in no distress Mental status - normal mood, behavior, speech, dress, motor activity, and thought processes Eyes -pink conjunctiva.   Neck - supple, no significant adenopathy Chest -it sounds slightly decreased bilaterally without wheezes or rhonchi Heart - normal rate, regular rhythm, normal S1, S2, no murmurs, rubs, clicks or gallops Extremities -no lower extremity edema.  He has varicose veins in the lower legs. Diabetic Foot Exam - Simple   Simple Foot Form Visual Inspection No deformities, no ulcerations, no other skin breakdown bilaterally:  Yes Sensation Testing Intact to touch and monofilament testing bilaterally:  Yes Pulse Check Posterior Tibialis and Dorsalis pulse intact bilaterally:  Yes Comments     Results for orders placed or performed in visit on 09/16/17  POCT glucose (manual entry)  Result Value Ref Range   POC Glucose 230 (A) 70 - 99 mg/dl  POCT glycosylated hemoglobin (Hb A1C)  Result Value Ref Range   Hemoglobin A1C  4.0 - 5.6 %   HbA1c, POC (prediabetic range)  5.7 - 6.4 %   HbA1c, POC (controlled diabetic range) 9.8 (A) 0.0 - 7.0 %    ASSESSMENT AND PLAN: 1. Diabetes mellitus type 2, uncontrolled, without complications (HCC) Discussed the importance of healthy eating habits and regular exercise at least 3-4 times a week for 30 minutes. Discussed trying him on very low dose of metformin along with Amaryl.  He will let me know if he develops any diarrhea on the low dose. - POCT glucose (manual entry) - POCT glycosylated hemoglobin (Hb A1C) - CBC - Comprehensive  metabolic panel - Lipid panel - metFORMIN (GLUCOPHAGE) 500 MG tablet; Take 1 tablet (500 mg total) by mouth daily with breakfast.  Dispense: 90 tablet; Refill: 1 - glimepiride (AMARYL) 2 MG tablet; Take 1 tablet (2 mg total) by mouth daily before breakfast.  Dispense: 90 tablet; Refill: 3  2. Tobacco dependence Patient advised to quit smoking. Discussed health risks associated with smoking including lung  and other types of cancers, chronic lung diseases and CV risks.. Pt ready to give trail of quitting.  Discussed methods to help quit including quitting cold [place], use of NRT, Chantix and Bupropion.  He would like to try the nicotine patches.  I have given him information on 1 800 quit now and if encouraged him to call to request a free patches About 5 minutes spent on counseling.  3. Essential hypertension Not at goal.  Restart lisinopril.  Advise low-salt intake - lisinopril (PRINIVIL,ZESTRIL) 10 MG tablet; Take 1 tablet (10 mg total) by mouth daily.  Dispense: 90 tablet; Refill: 3  4. Vasculogenic erectile dysfunction, unspecified vasculogenic erectile dysfunction type Pt advised of possible side effects of this medication including prolong erection, flushing, headaches, stuff nose and sudden vision and hearing changes.  Pt told to be seen in ER if he has erection lasting longer than 3-4 hrs, or if he has sudden vision changes or hearing loss. - sildenafil (VIAGRA) 100 MG tablet; 1/2 to 1 tab PO 1 hr prior to intercourse PRN  Dispense: 20 tablet; Refill: 6  5. Prostate cancer screening - PSA   Patient was given the opportunity to ask questions.  Patient verbalized understanding of the plan and was able to repeat key elements of the plan.   Orders Placed This Encounter  Procedures  . CBC  . Comprehensive metabolic panel  . Lipid panel  . PSA  . POCT glucose (manual entry)  . POCT glycosylated hemoglobin (Hb A1C)     Requested Prescriptions   Signed Prescriptions Disp Refills    . Tdap (BOOSTRIX) 5-2.5-18.5 LF-MCG/0.5 injection 0.5 mL 0    Sig: Inject 0.5 mLs into the muscle once for 1 dose.  . metFORMIN (GLUCOPHAGE) 500 MG tablet 90 tablet 1    Sig: Take 1 tablet (500 mg total) by mouth daily with breakfast.  . glimepiride (AMARYL) 2 MG tablet 90 tablet 3    Sig: Take 1 tablet (2 mg total) by mouth daily before breakfast.  . lisinopril (PRINIVIL,ZESTRIL) 10 MG tablet 90 tablet 3    Sig: Take 1 tablet (10 mg total) by mouth daily.  . sildenafil (VIAGRA) 100 MG tablet 20 tablet 6    Sig: 1/2 to 1 tab PO 1 hr prior to intercourse PRN    Return in about 3 months (around 12/17/2017).  Jonah Blue, MD, FACP

## 2017-09-16 NOTE — Patient Instructions (Addendum)
Call 1 [phone]-Quit Now to request the nicotine patches.  Follow a Healthy Eating Plan - You can do it! Limit sugary drinks.  Avoid sodas, sweet tea, sport or energy drinks, or fruit drinks.  Drink water, lo-fat milk, or diet drinks. Limit snack foods.   Cut back on candy, cake, cookies, chips, ice cream.  These are a special treat, only in small amounts. Eat plenty of vegetables.  Especially dark green, red, and orange vegetables. Aim for at least 3 servings a day. More is better! Include fruit in your daily diet.  Whole fruit is much healthier than fruit juice! Limit "white" bread, "white" pasta, "white" rice.   Choose "100% whole grain" products, brown or wild rice. Avoid fatty meats. Try "Meatless Monday" and choose eggs or beans one day a week.  When eating meat, choose lean meats like chicken, [place], and fish.  Grill, broil, or bake meats instead of frying, and eat poultry without the skin. Eat less salt.  Avoid frozen pizzas, frozen dinners and salty foods.  Use seasonings other than salt in cooking.  This can help blood pressure and keep you from swelling Beer, wine and liquor have calories.  If you can safely drink alcohol, limit to 1 drink per day for women, 2 drinks for men   Td Vaccine (Tetanus and Diphtheria): What You Need to Know 1. Why get vaccinated? Tetanus  and diphtheria are very serious diseases. They are rare in the [place] today, but people who do become infected often have severe complications. Td vaccine is used to protect adolescents and adults from both of these diseases. Both tetanus and diphtheria are infections caused by bacteria. Diphtheria spreads from person to person through coughing or sneezing. Tetanus-causing bacteria enter the body through cuts, scratches, or wounds. TETANUS (lockjaw) causes painful muscle tightening and stiffness, usually all over the body.  It can lead to tightening of muscles in the head and neck so you can't open your mouth, swallow, or  sometimes even breathe. Tetanus kills about 1 out of every 10 people who are infected even after receiving the best medical care.  DIPHTHERIA can cause a thick coating to form in the back of the throat.  It can lead to breathing problems, paralysis, heart failure, and death.  Before vaccines, as many as 200,000 cases of diphtheria and hundreds of cases of tetanus were reported in the [place] each year. Since vaccination began, reports of cases for both diseases have dropped by about 99%. 2. Td vaccine Td vaccine can protect adolescents and adults from tetanus and diphtheria. Td is usually given as a booster dose every 10 years but it can also be given earlier after a severe and dirty wound or burn. Another vaccine, called Tdap, which protects against pertussis in addition to tetanus and diphtheria, is sometimes recommended instead of Td vaccine. Your doctor or the person giving you the vaccine can give you more information. Td may safely be given at the same time as other vaccines. 3. Some people should not get this vaccine  A person who has ever had a life-threatening allergic reaction after a previous dose of any tetanus or diphtheria containing vaccine, OR has a severe allergy to any part of this vaccine, should not get Td vaccine. Tell the person giving the vaccine about any severe allergies.  Talk to your doctor if you: ? had severe pain or swelling after any vaccine containing diphtheria or tetanus, ? ever had a condition called Guillain Barre Syndrome (  GBS), ? aren't feeling well on the day the shot is scheduled. 4. What are the risks from Td vaccine? With any medicine, including vaccines, there is a chance of side effects. These are usually mild and go away on their own. Serious reactions are also possible but are rare. Most people who get Td vaccine do not have any problems with it. Mild problems following Td vaccine: (Did not interfere with activities)  Pain where the shot  was given (about 8 people in 10)  Redness or swelling where the shot was given (about 1 person in 4)  Mild fever (rare)  Headache (about 1 person in 4)  Tiredness (about 1 person in 4)  Moderate problems following Td vaccine: (Interfered with activities, but did not require medical attention)  Fever over 102F (rare)  Severe problems following Td vaccine: (Unable to perform usual activities; required medical attention)  Swelling, severe pain, bleeding and/or redness in the arm where the shot was given (rare).  Problems that could happen after any vaccine:  People sometimes faint after a medical procedure, including vaccination. Sitting or lying down for about 15 minutes can help prevent fainting, and injuries caused by a fall. Tell your doctor if you feel dizzy, or have vision changes or ringing in the ears.  Some people get severe pain in the shoulder and have difficulty moving the arm where a shot was given. This happens very rarely.  Any medication can cause a severe allergic reaction. Such reactions from a vaccine are very rare, estimated at fewer than 1 in a million doses, and would happen within a few minutes to a few hours after the vaccination. As with any medicine, there is a very remote chance of a vaccine causing a serious injury or death. The safety of vaccines is always being monitored. For more information, visit: http://www.aguilar.org/ 5. What if there is a serious reaction? What should I look for? Look for anything that concerns you, such as signs of a severe allergic reaction, very high fever, or unusual behavior. Signs of a severe allergic reaction can include hives, swelling of the face and throat, difficulty breathing, a fast heartbeat, dizziness, and weakness. These would usually start a few minutes to a few hours after the vaccination. What should I do?  If you think it is a severe allergic reaction or other emergency that can't wait, call 9-1-1 or get the  person to the nearest hospital. Otherwise, call your doctor.  Afterward, the reaction should be reported to the Vaccine Adverse Event Reporting System (VAERS). Your doctor might file this report, or you can do it yourself through the VAERS web site at www.vaers.SamedayNews.es, or by calling 781-174-7566. ? VAERS does not give medical advice. 6. The National Vaccine Injury Compensation Program The [employer] Vaccine Injury Compensation Program (VICP) is a federal program that was created to compensate people who may have been injured by certain vaccines. Persons who believe they may have been injured by a vaccine can learn about the program and about filing a claim by calling 630-280-2991 or visiting the [place] website at GoldCloset.com.ee. There is a time limit to file a claim for compensation. 7. How can I learn more?  Ask your doctor. He or she can give you the vaccine package insert or suggest other sources of information.  Call your local or state health department.  Contact the Centers for Disease Control and Prevention (CDC): ? Call 3158218829 (1-800-CDC-INFO) ? Visit CDC's website at http://hunter.com/ CDC Td Vaccine VIS (07/22/15) This  information is not intended to replace advice given to you by your health care provider. Make sure you discuss any questions you have with your health care provider. Document Released: 01/24/2006 Document Revised: 12/18/2015 Document Reviewed: 12/18/2015 Elsevier Interactive Patient Education  2017 [employer]. Pneumococcal Polysaccharide Vaccine: What You Need to Know 1. Why get vaccinated? Vaccination can protect older adults (and some children and younger adults) from pneumococcal disease. Pneumococcal disease is caused by bacteria that can spread from person to person through close contact. It can cause ear infections, and it can also lead to more serious infections of the:  Lungs (pneumonia),  Blood (bacteremia), and  Covering  of the brain and spinal cord (meningitis). Meningitis can cause deafness and brain damage, and it can be fatal.  Anyone can get pneumococcal disease, but children under 59 years of age, people with certain medical conditions, adults over 57 years of age, and cigarette smokers are at the highest risk. About 18,000 older adults die each year from pneumococcal disease in the [place]. Treatment of pneumococcal infections with penicillin and other drugs used to be more effective. But some strains of the disease have become resistant to these drugs. This makes prevention of the disease, through vaccination, even more important. 2. Pneumococcal polysaccharide vaccine (PPSV23) Pneumococcal polysaccharide vaccine (PPSV23) protects against 23 types of pneumococcal bacteria. It will not prevent all pneumococcal disease. PPSV23 is recommended for:  All adults 59 years of age and older,  Anyone 2 through 60 years of age with certain long-term health problems,  Anyone 2 through 60 years of age with a weakened immune system,  Adults 51 through 60 years of age who smoke cigarettes or have asthma.  Most people need only one dose of PPSV. A second dose is recommended for certain high-risk groups. People 69 and older should get a dose even if they have gotten one or more doses of the vaccine before they turned 65. Your healthcare provider can give you more information about these recommendations. Most healthy adults develop protection within 2 to 3 weeks of getting the shot. 3. Some people should not get this vaccine  Anyone who has had a life-threatening allergic reaction to PPSV should not get another dose.  Anyone who has a severe allergy to any component of PPSV should not receive it. Tell your provider if you have any severe allergies.  Anyone who is moderately or severely ill when the shot is scheduled may be asked to wait until they recover before getting the vaccine. Someone with a mild illness  can usually be vaccinated.  Children less than 20 years of age should not receive this vaccine.  There is no evidence that PPSV is harmful to either a pregnant woman or to her fetus. However, as a precaution, women who need the vaccine should be vaccinated before becoming pregnant, if possible. 4. Risks of a vaccine reaction With any medicine, including vaccines, there is a chance of side effects. These are usually mild and go away on their own, but serious reactions are also possible. About half of people who get PPSV have mild side effects, such as redness or pain where the shot is given, which go away within about two days. Less than 1 out of 100 people develop a fever, muscle aches, or more severe local reactions. Problems that could happen after any vaccine:  People sometimes faint after a medical procedure, including vaccination. Sitting or lying down for about 15 minutes can help prevent fainting, and injuries  caused by a fall. Tell your doctor if you feel dizzy, or have vision changes or ringing in the ears.  Some people get severe pain in the shoulder and have difficulty moving the arm where a shot was given. This happens very rarely.  Any medication can cause a severe allergic reaction. Such reactions from a vaccine are very rare, estimated at about 1 in a million doses, and would happen within a few minutes to a few hours after the vaccination. As with any medicine, there is a very remote chance of a vaccine causing a serious injury or death. The safety of vaccines is always being monitored. For more information, visit: http://floyd.org/ 5. What if there is a serious reaction? What should I look for? Look for anything that concerns you, such as signs of a severe allergic reaction, very high fever, or unusual behavior. Signs of a severe allergic reaction can include hives, swelling of the face and throat, difficulty breathing, a fast heartbeat, dizziness, and weakness. These  would usually start a few minutes to a few hours after the vaccination. What should I do? If you think it is a severe allergic reaction or other emergency that can't wait, call 9-1-1 or get to the nearest hospital. Otherwise, call your doctor. Afterward, the reaction should be reported to the Vaccine Adverse Event Reporting System (VAERS). Your doctor might file this report, or you can do it yourself through the VAERS web site at www.vaers.LAgents.no, or by calling 1-534-726-2389. VAERS does not give medical advice. 6. How can I learn more?  Ask your doctor. He or she can give you the vaccine package insert or suggest other sources of information.  Call your local or state health department.  Contact the Centers for Disease Control and Prevention (CDC): ? Call (403)272-0077 (1-800-CDC-INFO) or ? Visit CDC's website at PicCapture.uy CDC Pneumococcal Polysaccharide Vaccine VIS (08/03/13) This information is not intended to replace advice given to you by your health care provider. Make sure you discuss any questions you have with your health care provider. Document Released: 01/24/2006 Document Revised: 12/18/2015 Document Reviewed: 12/18/2015 Elsevier Interactive Patient Education  2017 [employer].

## 2017-09-17 LAB — COMPREHENSIVE METABOLIC PANEL
ALBUMIN: 4.5 g/dL (ref 3.6–4.8)
ALT: 25 IU/L (ref 0–44)
AST: 20 IU/L (ref 0–40)
Albumin/Globulin Ratio: 1.7 (ref 1.2–2.2)
Alkaline Phosphatase: 105 IU/L (ref 39–117)
BILIRUBIN TOTAL: 0.3 mg/dL (ref 0.0–1.2)
BUN / CREAT RATIO: 12 (ref 10–24)
BUN: 12 mg/dL (ref 8–27)
CHLORIDE: 101 mmol/L (ref 96–106)
CO2: 25 mmol/L (ref 20–29)
CREATININE: 0.98 mg/dL (ref 0.76–1.27)
Calcium: 9.7 mg/dL (ref 8.6–10.2)
GFR calc non Af Amer: 83 mL/min/{1.73_m2} (ref >59)
GFR, EST AFRICAN AMERICAN: 96 mL/min/{1.73_m2} (ref >59)
GLUCOSE: 206 mg/dL — AB (ref 65–99)
Globulin, Total: 2.6 g/dL (ref 1.5–4.5)
Potassium: 4.7 mmol/L (ref 3.5–5.2)
Sodium: 140 mmol/L (ref 134–144)
TOTAL PROTEIN: 7.1 g/dL (ref 6.0–8.5)

## 2017-09-17 LAB — LIPID PANEL
Chol/HDL Ratio: 4.6 ratio (ref 0.0–5.0)
Cholesterol, Total: 155 mg/dL (ref 100–199)
HDL: 34 mg/dL — ABNORMAL LOW (ref >39)
LDL CALC: 92 mg/dL (ref 0–99)
Triglycerides: 144 mg/dL (ref 0–149)
VLDL CHOLESTEROL CAL: 29 mg/dL (ref 5–40)

## 2017-09-17 LAB — CBC
Hematocrit: 47.1 % (ref 37.5–51.0)
Hemoglobin: 16.5 g/dL (ref 13.0–17.7)
MCH: 30.3 pg (ref 26.6–33.0)
MCHC: 35 g/dL (ref 31.5–35.7)
MCV: 86 fL (ref 79–97)
PLATELETS: 190 10*3/uL (ref 150–450)
RBC: 5.45 x10E6/uL (ref 4.14–5.80)
RDW: 14.9 % (ref 12.3–15.4)
WBC: 8.3 10*3/uL (ref 3.4–10.8)

## 2017-09-17 LAB — PSA: PROSTATE SPECIFIC AG, SERUM: 1.4 ng/mL (ref 0.0–4.0)

## 2017-09-18 ENCOUNTER — Other Ambulatory Visit: Payer: Self-pay | Admitting: Internal Medicine

## 2017-09-18 MED ORDER — ATORVASTATIN CALCIUM 20 MG PO TABS: 20.0000 mg | ORAL_TABLET | Freq: Every day | ORAL | 3 refills | Status: DC

## 2017-09-19 ENCOUNTER — Other Ambulatory Visit: Payer: Self-pay

## 2017-09-19 MED ORDER — PNEUMOCOCCAL VAC POLYVALENT 25 MCG/0.5ML IJ INJ: 0.5000 mL | INJECTION | Freq: Once | INTRAMUSCULAR | 0 refills | Status: AC

## 2017-09-19 MED FILL — $BOOSTRIX VACCINE SYRINGE: 5-2.5-18.5 | 1 days supply | Qty: 1 | Fill #0

## 2017-09-19 MED FILL — $PNEUMOVAX 23 VIAL: 25 | 1 days supply | Qty: 1 | Fill #0

## 2017-09-21 ENCOUNTER — Telehealth: Payer: Self-pay

## 2017-09-21 NOTE — Telephone Encounter (Signed)
Contacted pt to go over lab results pt picked up the phone and call dropped. Will try and call pt later today

## 2017-10-17 MED FILL — metFORMIN HCL 500 MG TABS: 500 | 30 days supply | Qty: 30 | Fill #1

## 2017-10-17 MED FILL — !VIAGRA 100 MG TABLET: 100 MG | 30 days supply | Qty: 10 | Fill #1

## 2017-10-17 MED FILL — GLIMEPIRIDE 2 MG TABS: 2 | 30 days supply | Qty: 30 | Fill #1

## 2017-10-17 MED FILL — LISINOPRIL 10 MG TABS: 10 | 30 days supply | Qty: 30 | Fill #1

## 2017-11-14 MED FILL — GLIMEPIRIDE 2 MG TABS: 2 | 30 days supply | Qty: 30 | Fill #2

## 2017-11-14 MED FILL — metFORMIN HCL 500 MG TABS: 500 | 30 days supply | Qty: 30 | Fill #2

## 2017-11-14 MED FILL — !VIAGRA 100 MG TABLET: 100 MG | 30 days supply | Qty: 10 | Fill #2

## 2017-11-14 MED FILL — LISINOPRIL 10 MG TABS: 10 | 30 days supply | Qty: 30 | Fill #2

## 2017-12-13 MED FILL — GLIMEPIRIDE 2 MG TABS: 2 | 30 days supply | Qty: 30 | Fill #3

## 2017-12-13 MED FILL — LISINOPRIL 10 MG TABS: 10 | 30 days supply | Qty: 30 | Fill #3

## 2017-12-13 MED FILL — metFORMIN HCL 500 MG TABS: 500 | 30 days supply | Qty: 30 | Fill #3

## 2017-12-13 MED FILL — $VIAGRA 100 MG TABLET: 100 | 90 days supply | Qty: 30 | Fill #3

## 2017-12-16 ENCOUNTER — Encounter: Payer: Self-pay | Admitting: Internal Medicine

## 2017-12-16 ENCOUNTER — Ambulatory Visit: Payer: Self-pay | Attending: Internal Medicine | Admitting: Internal Medicine

## 2017-12-16 VITALS — BP 169/50 | HR 69 | Temp 98.1°F | Resp 16 | Wt 309.2 lb

## 2017-12-16 DIAGNOSIS — E785 Hyperlipidemia, unspecified: Secondary | ICD-10-CM

## 2017-12-16 DIAGNOSIS — I10 Essential (primary) hypertension: Secondary | ICD-10-CM | POA: Insufficient documentation

## 2017-12-16 DIAGNOSIS — E1110 Type 2 diabetes mellitus with ketoacidosis without coma: Secondary | ICD-10-CM

## 2017-12-16 DIAGNOSIS — E119 Type 2 diabetes mellitus without complications: Secondary | ICD-10-CM | POA: Insufficient documentation

## 2017-12-16 DIAGNOSIS — F17210 Nicotine dependence, cigarettes, uncomplicated: Secondary | ICD-10-CM | POA: Insufficient documentation

## 2017-12-16 DIAGNOSIS — N529 Male erectile dysfunction, unspecified: Secondary | ICD-10-CM | POA: Insufficient documentation

## 2017-12-16 DIAGNOSIS — Z79899 Other long term (current) drug therapy: Secondary | ICD-10-CM | POA: Insufficient documentation

## 2017-12-16 DIAGNOSIS — Z7984 Long term (current) use of oral hypoglycemic drugs: Secondary | ICD-10-CM | POA: Insufficient documentation

## 2017-12-16 DIAGNOSIS — Z23 Encounter for immunization: Secondary | ICD-10-CM | POA: Insufficient documentation

## 2017-12-16 DIAGNOSIS — Z8249 Family history of ischemic heart disease and other diseases of the circulatory system: Secondary | ICD-10-CM | POA: Insufficient documentation

## 2017-12-16 DIAGNOSIS — F17200 Nicotine dependence, unspecified, uncomplicated: Secondary | ICD-10-CM

## 2017-12-16 DIAGNOSIS — Z833 Family history of diabetes mellitus: Secondary | ICD-10-CM | POA: Insufficient documentation

## 2017-12-16 HISTORY — DX: Hyperlipidemia, unspecified: E78.5

## 2017-12-16 LAB — GLUCOSE, POCT (MANUAL RESULT ENTRY): POC Glucose: 139 mg/dl — AB (ref 70–99)

## 2017-12-16 LAB — POCT GLYCOSYLATED HEMOGLOBIN (HGB A1C): HbA1c, POC (controlled diabetic range): 6.7 % (ref 0.0–7.0)

## 2017-12-16 MED ORDER — ASPIRIN EC 81 MG PO TBEC: 81.0000 mg | DELAYED_RELEASE_TABLET | Freq: Every day | ORAL | Status: AC

## 2017-12-16 MED ORDER — LISINOPRIL 20 MG PO TABS: 20.0000 mg | ORAL_TABLET | Freq: Every day | ORAL | 1 refills | Status: DC

## 2017-12-16 NOTE — Patient Instructions (Addendum)
Your blood pressure is not at goal.  Please increase lisinopril to 20 mg daily. Continue to work on cutting back on the cigarettes. I recommend taking a baby aspirin which is 81 mg daily.   Influenza Virus Vaccine injection (Fluarix) What is this medicine? INFLUENZA VIRUS VACCINE (in floo EN zuh VAHY ruhs vak SEEN) helps to reduce the risk of getting influenza also known as the flu. This medicine may be used for other purposes; ask your health care provider or pharmacist if you have questions. COMMON BRAND NAME(S): Fluarix, Fluzone What should I tell my health care provider before I take this medicine? They need to know if you have any of these conditions: -bleeding disorder like hemophilia -fever or infection -Guillain-Barre syndrome or other neurological problems -immune system problems -infection with the human immunodeficiency virus (HIV) or AIDS -low blood platelet counts -multiple sclerosis -an unusual or allergic reaction to influenza virus vaccine, eggs, chicken proteins, latex, gentamicin, other medicines, foods, dyes or preservatives -pregnant or trying to get pregnant -breast-feeding How should I use this medicine? This vaccine is for injection into a muscle. It is given by a health care professional. A copy of Vaccine Information Statements will be given before each vaccination. Read this sheet carefully each time. The sheet may change frequently. Talk to your pediatrician regarding the use of this medicine in children. Special care may be needed. Overdosage: If you think you have taken too much of this medicine contact a poison control center or emergency room at once. NOTE: This medicine is only for you. Do not share this medicine with others. What if I miss a dose? This does not apply. What may interact with this medicine? -chemotherapy or radiation therapy -medicines that lower your immune system like etanercept, anakinra, infliximab, and adalimumab -medicines that  treat or prevent blood clots like warfarin -phenytoin -steroid medicines like prednisone or cortisone -theophylline -vaccines This list may not describe all possible interactions. Give your health care provider a list of all the medicines, herbs, non-prescription drugs, or dietary supplements you use. Also tell them if you smoke, drink alcohol, or use illegal drugs. Some items may interact with your medicine. What should I watch for while using this medicine? Report any side effects that do not go away within 3 days to your doctor or health care professional. Call your health care provider if any unusual symptoms occur within 6 weeks of receiving this vaccine. You may still catch the flu, but the illness is not usually as bad. You cannot get the flu from the vaccine. The vaccine will not protect against colds or other illnesses that may cause fever. The vaccine is needed every year. What side effects may I notice from receiving this medicine? Side effects that you should report to your doctor or health care professional as soon as possible: -allergic reactions like skin rash, itching or hives, swelling of the face, lips, or tongue Side effects that usually do not require medical attention (report to your doctor or health care professional if they continue or are bothersome): -fever -headache -muscle aches and pains -pain, tenderness, redness, or swelling at site where injected -weak or tired This list may not describe all possible side effects. Call your doctor for medical advice about side effects. You may report side effects to FDA at 1-800-FDA-1088. Where should I keep my medicine? This vaccine is only given in a clinic, pharmacy, doctor's office, or other health care setting and will not be stored at home. NOTE: This sheet  is a summary. It may not cover all possible information. If you have questions about this medicine, talk to your doctor, pharmacist, or health care provider.  2018  Elsevier/Gold Standard (2007-10-25 09:30:40)

## 2017-12-16 NOTE — Progress Notes (Signed)
Patient ID: Luis Larson, male    DOB: 03/29/1956  MRN: [id number]  CC: Diabetes   Subjective: Luis Larson is a 62 y.o. male who presents for chronic disease management His concerns today include: Pt with hx of HTN, DM, tob dep and ED  DM:  Checking BS once a wk. Range 120.  Tolerating Amaryl and metformin Eating habits:  Sometimes he has to grap quick meals due to work schedule. Stays active due to his line of work. No blurred vision.  No numbness in the feet.  HTN:  Tolerating Lisinopril Limits salt Drinks 4-5 cups of coffee a day.  No CP/SOB  Tob dep:  Did not call 1-800-Quit Now Went from 2 pks a day to 1 pk a day.  He reports losing a lot of weight in the past 2 years.  He is afraid of gaining it back if he stops smoking.  I went over results of lab studies that were done on last visit.  His LDL was not at goal.  I recommended starting Lipitor 20 mg.  Patient states that he did not get my message.  He will pick up the Lipitor today.  Patient Active Problem List   Diagnosis Date Noted  . Tobacco dependence 09/16/2017  . DM (diabetes mellitus) type 2, uncontrolled, with ketoacidosis (HCC) 10/04/2013  . Other male erectile dysfunction 10/04/2013  . Colon cancer screening 10/04/2013  . Diabetes (HCC) 12/22/2012  . Essential hypertension 12/22/2012  . Type II or unspecified type diabetes mellitus without mention of complication, uncontrolled 10/17/2012     Current Outpatient Medications on File Prior to Visit  Medication Sig Dispense Refill  . atorvastatin (LIPITOR) 20 MG tablet Take 1 tablet (20 mg total) by mouth daily. 90 tablet 3  . glimepiride (AMARYL) 2 MG tablet Take 1 tablet (2 mg total) by mouth daily before breakfast. 90 tablet 3  . glucose blood test strip Use as instructed 100 each 12  . glucose monitoring kit (FREESTYLE) monitoring kit 1 each by Does not apply route as needed for other. 1 each 2  . metFORMIN (GLUCOPHAGE) 500 MG tablet Take 1 tablet (500 mg  total) by mouth daily with breakfast. 90 tablet 1  . sildenafil (VIAGRA) 100 MG tablet 1/2 to 1 tab PO 1 hr prior to intercourse PRN 20 tablet 6   No current facility-administered medications on file prior to visit.     No Known Allergies  Social History   Socioeconomic History  . Marital status: Divorced    Spouse name: Not on file  . Number of children: Not on file  . Years of education: Not on file  . Highest education level: Not on file  Occupational History  . Not on file  Social Needs  . Financial resource strain: Not on file  . Food insecurity:    Worry: Not on file    Inability: Not on file  . Transportation needs:    Medical: Not on file    Non-medical: Not on file  Tobacco Use  . Smoking status: Current Every Day Smoker    Packs/day: 2.00    Types: Cigarettes  . Smokeless tobacco: Never Used  . Tobacco comment: states he cut back to 1 pack per day  Substance and Sexual Activity  . Alcohol use: No  . Drug use: No  . Sexual activity: Not on file  Lifestyle  . Physical activity:    Days per week: Not on file    Minutes per  session: Not on file  . Stress: Not on file  Relationships  . Social connections:    Talks on phone: Not on file    Gets together: Not on file    Attends religious service: Not on file    Active member of club or organization: Not on file    Attends meetings of clubs or organizations: Not on file    Relationship status: Not on file  . Intimate partner violence:    Fear of current or ex partner: Not on file    Emotionally abused: Not on file    Physically abused: Not on file    Forced sexual activity: Not on file  Other Topics Concern  . Not on file  Social History Narrative  . Not on file    Family History  Problem Relation Age of Onset  . Diabetes Mother   . Heart disease Mother   . Heart disease Father     No past surgical history on file.  ROS: Review of Systems Negative except as above.    PHYSICAL EXAM: BP (!)  169/50   Pulse 69   Temp 98.1 F (36.7 C) (Oral)   Resp 16   Wt (!) 309 lb 3.2 oz (140.3 kg)   SpO2 95%   BMI 43.12 kg/m   Physical Exam General appearance - alert, well appearing, and in no distress Mental status - normal mood, behavior, speech, dress, motor activity, and thought processes Chest - clear to auscultation, no wheezes, rales or rhonchi, symmetric air entry Heart - normal rate, regular rhythm, normal S1, S2, no murmurs, rubs, clicks or gallops Extremities - peripheral pulses normal, no pedal edema, no clubbing or cyanosis  Results for orders placed or performed in visit on 12/16/17  POCT glucose (manual entry)  Result Value Ref Range   POC Glucose 139 (A) 70 - 99 mg/dl  POCT glycosylated hemoglobin (Hb A1C)  Result Value Ref Range   Hemoglobin A1C     HbA1c POC (<> result, manual entry)     HbA1c, POC (prediabetic range)     HbA1c, POC (controlled diabetic range) 6.7 0.0 - 7.0 %   Lab Results  Component Value Date   WBC 8.3 09/16/2017   HGB 16.5 09/16/2017   HCT 47.1 09/16/2017   MCV 86 09/16/2017   PLT 190 09/16/2017     Chemistry      Component Value Date/Time   NA 140 09/16/2017 1648   K 4.7 09/16/2017 1648   CL 101 09/16/2017 1648   CO2 25 09/16/2017 1648   BUN 12 09/16/2017 1648   CREATININE 0.98 09/16/2017 1648   CREATININE 0.87 09/16/2014 1744      Component Value Date/Time   CALCIUM 9.7 09/16/2017 1648   ALKPHOS 105 09/16/2017 1648   AST 20 09/16/2017 1648   ALT 25 09/16/2017 1648   BILITOT 0.3 09/16/2017 1648     Lab Results  Component Value Date   CHOL 155 09/16/2017   HDL 34 (L) 09/16/2017   LDLCALC 92 09/16/2017   TRIG 144 09/16/2017   CHOLHDL 4.6 09/16/2017    .ASSESSMENT AND PLAN: 1. Controlled type 2 diabetes mellitus without complication, without long-term current use of insulin (HCC) Commended him on getting his diabetes under control. Dietary counseling given.  Encouraged him to make wise food choices even when he has to  eat on the go. - POCT glucose (manual entry) - POCT glycosylated hemoglobin (Hb A1C) - Microalbumin / creatinine urine ratio  2.  Essential hypertension Not at goal.  Increase lisinopril.  DASH diet encouraged. - lisinopril (PRINIVIL,ZESTRIL) 20 MG tablet; Take 1 tablet (20 mg total) by mouth daily.  Dispense: 90 tablet; Refill: 1  3. Tobacco dependence Commended him on being able to cut back to 1 pack a day.  Encouraged him to continue cutting back.  Discussed the health risks associated with smoking.  4. Hyperlipidemia, unspecified hyperlipidemia type Patient will pick up Lipitor today.  5. Need for immunization against influenza - Flu Vaccine QUAD 36+ mos IM  Advised to take a baby aspirin daily given that he is at high risk for heart attack and strokes  Patient was given the opportunity to ask questions.  Patient verbalized understanding of the plan and was able to repeat key elements of the plan.   Orders Placed This Encounter  Procedures  . Flu Vaccine QUAD 36+ mos IM  . Microalbumin / creatinine urine ratio  . POCT glucose (manual entry)  . POCT glycosylated hemoglobin (Hb A1C)     Requested Prescriptions   Signed Prescriptions Disp Refills  . lisinopril (PRINIVIL,ZESTRIL) 20 MG tablet 90 tablet 1    Sig: Take 1 tablet (20 mg total) by mouth daily.  Marland Kitchen aspirin EC 81 MG tablet 30 tablet     Sig: Take 1 tablet (81 mg total) by mouth daily.    Return in about 4 months (around 04/17/2018).  Jonah Blue, MD, FACP

## 2017-12-17 LAB — MICROALBUMIN / CREATININE URINE RATIO
Creatinine, Urine: 221.6 mg/dL
Microalb/Creat Ratio: 10.2 mg/g creat (ref 0.0–30.0)
Microalbumin, Urine: 22.5 ug/mL

## 2018-01-02 MED FILL — ATORVASTATIN 20 MG TABLET: 20 | 30 days supply | Qty: 30 | Fill #0

## 2018-01-16 MED FILL — LISINOPRIL 20 MG TAB: 20 | 30 days supply | Qty: 30 | Fill #0

## 2018-01-16 MED FILL — GLIMEPIRIDE 2 MG TABS: 2 | 30 days supply | Qty: 30 | Fill #4

## 2018-01-16 MED FILL — metFORMIN HCL 500 MG TABS: 500 | 30 days supply | Qty: 30 | Fill #4

## 2018-02-27 MED FILL — GLIMEPIRIDE 2 MG TABS: 2 | 30 days supply | Qty: 30 | Fill #5

## 2018-02-27 MED FILL — LISINOPRIL 20 MG TAB: 20 | 30 days supply | Qty: 30 | Fill #1

## 2018-02-27 MED FILL — metFORMIN HCL 500 MG TABS: 500 | 30 days supply | Qty: 30 | Fill #5

## 2018-04-03 ENCOUNTER — Other Ambulatory Visit: Payer: Self-pay | Admitting: Internal Medicine

## 2018-04-03 DIAGNOSIS — E1165 Type 2 diabetes mellitus with hyperglycemia: Principal | ICD-10-CM

## 2018-04-03 DIAGNOSIS — IMO0001 Reserved for inherently not codable concepts without codable children: Secondary | ICD-10-CM

## 2018-04-03 MED FILL — $VIAGRA 100 MG TABLET: 100 | 90 days supply | Qty: 30 | Fill #4

## 2018-04-03 MED FILL — GLIMEPIRIDE 2 MG TABS: 2 | 30 days supply | Qty: 30 | Fill #6

## 2018-04-03 MED FILL — LISINOPRIL 20 MG TAB: 20 | 30 days supply | Qty: 30 | Fill #2

## 2018-04-13 ENCOUNTER — Other Ambulatory Visit: Payer: Self-pay

## 2018-04-13 ENCOUNTER — Encounter (HOSPITAL_BASED_OUTPATIENT_CLINIC_OR_DEPARTMENT_OTHER): Payer: Self-pay

## 2018-04-13 ENCOUNTER — Emergency Department (HOSPITAL_BASED_OUTPATIENT_CLINIC_OR_DEPARTMENT_OTHER)
Admission: EM | Admit: 2018-04-13 | Discharge: 2018-04-13 | Disposition: A | Discharge status: Home / Self Care | Payer: Self-pay | Attending: Emergency Medicine | Admitting: Emergency Medicine

## 2018-04-13 DIAGNOSIS — Z7984 Long term (current) use of oral hypoglycemic drugs: Secondary | ICD-10-CM | POA: Insufficient documentation

## 2018-04-13 DIAGNOSIS — Z7982 Long term (current) use of aspirin: Secondary | ICD-10-CM | POA: Insufficient documentation

## 2018-04-13 DIAGNOSIS — E119 Type 2 diabetes mellitus without complications: Secondary | ICD-10-CM | POA: Insufficient documentation

## 2018-04-13 DIAGNOSIS — H60313 Diffuse otitis externa, bilateral: Secondary | ICD-10-CM | POA: Insufficient documentation

## 2018-04-13 DIAGNOSIS — Z79899 Other long term (current) drug therapy: Secondary | ICD-10-CM | POA: Insufficient documentation

## 2018-04-13 DIAGNOSIS — F17210 Nicotine dependence, cigarettes, uncomplicated: Secondary | ICD-10-CM | POA: Insufficient documentation

## 2018-04-13 DIAGNOSIS — I10 Essential (primary) hypertension: Secondary | ICD-10-CM | POA: Insufficient documentation

## 2018-04-13 HISTORY — DX: Type 2 diabetes mellitus without complications: E11.9

## 2018-04-13 LAB — CBG MONITORING, ED: GLUCOSE-CAPILLARY: 90 mg/dL (ref 70–99)

## 2018-04-13 MED ORDER — OFLOXACIN 0.3 % OT SOLN: 10.0000 [drp] | Freq: Every day | OTIC | 0 refills | Status: DC

## 2018-04-13 MED ORDER — OFLOXACIN 0.3 % OT SOLN: 10.0000 [drp] | Freq: Every day | OTIC | 0 refills | Status: AC

## 2018-04-13 MED FILL — OFLOXACIN 0.3% EAR DROPS: 0.3 | 7 days supply | Qty: 5 | Fill #0

## 2018-04-13 NOTE — ED Triage Notes (Addendum)
C/o bilat ear pain-:feel like they're closing up" right worse than left x 4 days-NAD-steady gait

## 2018-04-13 NOTE — ED Provider Notes (Signed)
[facility] Provider Note   CSN: [id number] Arrival date & time: 04/13/18  1519     History   Chief Complaint Chief Complaint  Patient presents with  . Otalgia    HPI Luis Larson is a 63 y.o. male.  63 y.o male with a PMH of DM, HTN presents to the ED with BL ear pain x 4 days. Patient reports feels pressure on the inside of his ears along with difficulty hearing. He has not therapy for relieving symptoms but reports this happened in the past and he was prescribed drops which helped relieved his symptoms.  Reports pain with palpation of his ear worse with sleeping on his left side he denies any fever, URI symptoms, dizziness, chest pain or other complaints at this time.     Past Medical History:  Diagnosis Date  . Diabetes mellitus without complication ([place])   . Essential hypertension, benign 12/22/2012    Patient Active Problem List   Diagnosis Date Noted  . Hyperlipidemia 12/16/2017  . Tobacco dependence 09/16/2017  . DM (diabetes mellitus) type 2, uncontrolled, with ketoacidosis ([place]) 10/04/2013  . Other male erectile dysfunction 10/04/2013  . Colon cancer screening 10/04/2013  . Diabetes ([place]) 12/22/2012  . Essential hypertension 12/22/2012  . Type II or unspecified type diabetes mellitus without mention of complication, uncontrolled 10/17/2012    History reviewed. No pertinent surgical history.      Home Medications    Prior to Admission medications   Medication Sig Start Date End Date Taking? Authorizing Provider  aspirin EC 81 MG tablet Take 1 tablet (81 mg total) by mouth daily. 12/16/17   Ladell Pier, MD  atorvastatin (LIPITOR) 20 MG tablet Take 1 tablet (20 mg total) by mouth daily. 09/18/17   Ladell Pier, MD  glimepiride (AMARYL) 2 MG tablet Take 1 tablet (2 mg total) by mouth daily before breakfast. 09/16/17   Ladell Pier, MD  glucose blood test strip Use as instructed 06/06/13   Reyne Dumas, MD  glucose  monitoring kit (FREESTYLE) monitoring kit 1 each by Does not apply route as needed for other. 06/06/13   Reyne Dumas, MD  lisinopril (PRINIVIL,ZESTRIL) 20 MG tablet Take 1 tablet (20 mg total) by mouth daily. 12/16/17   Ladell Pier, MD  metFORMIN (GLUCOPHAGE) 500 MG tablet TAKE 1 TABLET (500 MG TOTAL) BY MOUTH DAILY WITH BREAKFAST. 04/03/18   Ladell Pier, MD  ofloxacin (FLOXIN) 0.3 % OTIC solution Place 10 drops into both ears daily for 7 days. 04/13/18 04/20/18  Janeece Fitting, PA-C  sildenafil (VIAGRA) 100 MG tablet 1/2 to 1 tab PO 1 hr prior to intercourse PRN 09/16/17   Ladell Pier, MD    Family History Family History  Problem Relation Age of Onset  . Diabetes Mother   . Heart disease Mother   . Heart disease Father     Social History Social History   Tobacco Use  . Smoking status: Current Every Day Smoker    Packs/day: 2.00    Types: Cigarettes  . Smokeless tobacco: Never Used  Substance Use Topics  . Alcohol use: No  . Drug use: No     Allergies   Patient has no known allergies.   Review of Systems Review of Systems  Constitutional: Negative for fever.  HENT: Positive for ear discharge and ear pain.      Physical Exam Updated Vital Signs BP (!) 182/88 (BP Location: Left Arm)   Pulse 67  Temp 98.3 F (36.8 C) (Oral)   Resp 18   Ht 5' 11"  (1.803 m)   Wt (!) 144.7 kg   SpO2 96%   BMI 44.49 kg/m   Physical Exam Vitals signs and nursing note reviewed.  Constitutional:      Appearance: He is well-developed.  HENT:     Head: Normocephalic and atraumatic.     Right Ear: Drainage and tenderness present.     Left Ear: Drainage and tenderness present. Tympanic membrane is injected, erythematous and bulging.     Ears:     Comments: Extensive white copious amount of discharge from left >right. Unable to fully visualize TM on left as ear is partially close.Pain with palpation of the tragus. No tenderness around mastoid.  Eyes:     General: No  scleral icterus.    Pupils: Pupils are equal, round, and reactive to light.  Neck:     Musculoskeletal: Normal range of motion.  Cardiovascular:     Heart sounds: Normal heart sounds.  Pulmonary:     Effort: Pulmonary effort is normal.     Breath sounds: Normal breath sounds. No wheezing.  Chest:     Chest wall: No tenderness.  Abdominal:     General: Bowel sounds are normal. There is no distension.     Palpations: Abdomen is soft.     Tenderness: There is no abdominal tenderness.  Musculoskeletal:        General: No tenderness or deformity.  Skin:    General: Skin is warm and dry.  Neurological:     Mental Status: He is alert and oriented to person, place, and time.      ED Treatments / Results  Labs (all labs ordered are listed, but only abnormal results are displayed) Labs Reviewed  CBG MONITORING, ED    EKG None  Radiology No results found.  Procedures Procedures (including critical care time)  Medications Ordered in ED Medications - No data to display   Initial Impression / Assessment and Plan / ED Course  I have reviewed the triage vital signs and the nursing notes.  Pertinent labs & imaging results that were available during my care of the patient were reviewed by me and considered in my medical decision making (see chart for details).     Patient presents with lateral ear drainage for the past 4 days, has not tried any over-the-counter measures.  Does report a similar episode in the past and having drops prescribed which help with his symptoms.  Ports having an appointment with his primary care physician next Monday in order to control his glucose.  CBG obtained ALT were 90, at this time will prescribe patient drops in order to help with his ear infection.  Will be placed on ox ofloxacin 10 drops daily for the next 7 days, he states having a PCP appointment on Monday will instruct him to obtain a referral to ENT further evaluation is needed.  Vitals stable  during discharge, patient stable for discharge requesting discharge at this time.  Final Clinical Impressions(s) / ED Diagnoses   Final diagnoses:  Acute diffuse otitis externa of both ears    ED Discharge Orders         Ordered    ofloxacin (FLOXIN) 0.3 % OTIC solution  Daily     04/13/18 1747           Janeece Fitting, PA-C 04/13/18 1748    Julianne Rice, MD 04/15/18 [phone]

## 2018-04-13 NOTE — Discharge Instructions (Addendum)
I have prescribed drops for your ear infection please place 10 drops to each ear daily for the next many days.  Please follow-up with your primary care physician as scheduled.  If you experience any worsening symptoms please return to the ED for reevaluation.

## 2018-04-17 ENCOUNTER — Ambulatory Visit: Payer: Self-pay | Admitting: Internal Medicine

## 2018-05-18 ENCOUNTER — Ambulatory Visit: Payer: Self-pay | Admitting: Internal Medicine

## 2018-06-26 ENCOUNTER — Other Ambulatory Visit: Payer: Self-pay

## 2018-06-26 ENCOUNTER — Encounter: Payer: Self-pay | Admitting: Internal Medicine

## 2018-06-26 ENCOUNTER — Ambulatory Visit: Payer: Self-pay | Attending: Internal Medicine | Admitting: Internal Medicine

## 2018-06-26 VITALS — BP 172/78 | HR 64 | Temp 98.0°F | Resp 16 | Ht 71.0 in | Wt 303.4 lb

## 2018-06-26 DIAGNOSIS — N529 Male erectile dysfunction, unspecified: Secondary | ICD-10-CM

## 2018-06-26 DIAGNOSIS — Z6841 Body Mass Index (BMI) 40.0 and over, adult: Secondary | ICD-10-CM

## 2018-06-26 DIAGNOSIS — M170 Bilateral primary osteoarthritis of knee: Secondary | ICD-10-CM

## 2018-06-26 DIAGNOSIS — M19012 Primary osteoarthritis, left shoulder: Secondary | ICD-10-CM

## 2018-06-26 DIAGNOSIS — F17200 Nicotine dependence, unspecified, uncomplicated: Secondary | ICD-10-CM

## 2018-06-26 DIAGNOSIS — E785 Hyperlipidemia, unspecified: Secondary | ICD-10-CM

## 2018-06-26 DIAGNOSIS — E1142 Type 2 diabetes mellitus with diabetic polyneuropathy: Secondary | ICD-10-CM

## 2018-06-26 DIAGNOSIS — E119 Type 2 diabetes mellitus without complications: Secondary | ICD-10-CM

## 2018-06-26 DIAGNOSIS — I10 Essential (primary) hypertension: Secondary | ICD-10-CM

## 2018-06-26 LAB — POCT GLYCOSYLATED HEMOGLOBIN (HGB A1C): HbA1c, POC (controlled diabetic range): 7 % (ref 0.0–7.0)

## 2018-06-26 LAB — GLUCOSE, POCT (MANUAL RESULT ENTRY): POC Glucose: 151 mg/dl — AB (ref 70–99)

## 2018-06-26 MED ORDER — GLIMEPIRIDE 2 MG PO TABS: 2.0000 mg | ORAL_TABLET | Freq: Every day | ORAL | 3 refills | Status: DC

## 2018-06-26 MED ORDER — METFORMIN HCL 500 MG PO TABS: 500.0000 mg | ORAL_TABLET | Freq: Every day | ORAL | 3 refills | Status: DC

## 2018-06-26 MED ORDER — ATORVASTATIN CALCIUM 20 MG PO TABS: 20.0000 mg | ORAL_TABLET | Freq: Every day | ORAL | 3 refills | Status: DC

## 2018-06-26 MED ORDER — SILDENAFIL CITRATE 100 MG PO TABS: ORAL_TABLET | ORAL | 3 refills | Status: DC

## 2018-06-26 MED ORDER — LISINOPRIL 20 MG PO TABS: 20.0000 mg | ORAL_TABLET | Freq: Every day | ORAL | 3 refills | Status: DC

## 2018-06-26 MED ORDER — MELOXICAM 15 MG PO TABS: 15.0000 mg | ORAL_TABLET | Freq: Every day | ORAL | 1 refills | Status: DC

## 2018-06-26 MED FILL — MELOXICAM 15 MG TABLET: 15 | 90 days supply | Qty: 90 | Fill #0

## 2018-06-26 MED FILL — ATORVASTATIN 20 MG TABLET: 20 | 90 days supply | Qty: 90 | Fill #0

## 2018-06-26 MED FILL — LISINOPRIL 20 MG TAB: 20 | 90 days supply | Qty: 90 | Fill #0

## 2018-06-26 MED FILL — SILDENAFIL CITRATE 100 MG T: 100 | 90 days supply | Qty: 30 | Fill #0

## 2018-06-26 MED FILL — metFORMIN HCL 500 MG TABS: 500 | 90 days supply | Qty: 90 | Fill #0

## 2018-06-26 MED FILL — GLIMEPIRIDE 2 MG TABS: 2 | 90 days supply | Qty: 90 | Fill #0

## 2018-06-26 NOTE — Patient Instructions (Signed)
Your blood pressure is not controlled.  Please restart lisinopril.  Try to limit salt in the foods as discussed.  I have started a medication called meloxicam which is an anti-inflammatory medication to be used for the arthritis pain.   Osteoarthritis  Osteoarthritis is a type of arthritis that affects tissue that covers the ends of bones in joints (cartilage). Cartilage acts as a cushion between the bones and helps them move smoothly. Osteoarthritis results when cartilage in the joints gets worn down. Osteoarthritis is sometimes called "wear and tear" arthritis. Osteoarthritis is the most common form of arthritis. It often occurs in older people. It is a condition that gets worse over time (a progressive condition). Joints that are most often affected by this condition are in:  Fingers.  Toes.  Hips.  Knees.  Spine, including neck and lower back. What are the causes? This condition is caused by age-related wearing down of cartilage that covers the ends of bones. What increases the risk? The following factors may make you more likely to develop this condition:  Older age.  Being overweight or obese.  Overuse of joints, such as in athletes.  Past injury of a joint.  Past surgery on a joint.  Family history of osteoarthritis. What are the signs or symptoms? The main symptoms of this condition are pain, swelling, and stiffness in the joint. The joint may lose its shape over time. Small pieces of bone or cartilage may break off and float inside of the joint, which may cause more pain and damage to the joint. Small deposits of bone (osteophytes) may grow on the edges of the joint. Other symptoms may include:  A grating or scraping feeling inside the joint when you move it.  Popping or creaking sounds when you move. Symptoms may affect one or more joints. Osteoarthritis in a major joint, such as your knee or hip, can make it painful to walk or exercise. If you have osteoarthritis  in your hands, you might not be able to grip items, twist your hand, or control small movements of your hands and fingers (fine motor skills). How is this diagnosed? This condition may be diagnosed based on:  Your medical history.  A physical exam.  Your symptoms.  X-rays of the affected joint(s).  Blood tests to rule out other types of arthritis. How is this treated? There is no cure for this condition, but treatment can help to control pain and improve joint function. Treatment plans may include:  A prescribed exercise program that allows for rest and joint relief. You may work with a physical therapist.  A weight control plan.  Pain relief techniques, such as: ? Applying heat and cold to the joint. ? Electric pulses delivered to nerve endings under the skin (transcutaneous electrical nerve stimulation, or TENS). ? Massage. ? Certain nutritional supplements.  NSAIDs or prescription medicines to help relieve pain.  Medicine to help relieve pain and inflammation (corticosteroids). This can be given by mouth (orally) or as an injection.  Assistive devices, such as a brace, wrap, splint, specialized glove, or cane.  Surgery, such as: ? An osteotomy. This is done to reposition the bones and relieve pain or to remove loose pieces of bone and cartilage. ? Joint replacement surgery. You may need this surgery if you have very bad (advanced) osteoarthritis. Follow these instructions at home: Activity  Rest your affected joints as directed by your health care provider.  Do not drive or use heavy machinery while taking prescription pain  medicine.  Exercise as directed. Your health care provider or physical therapist may recommend specific types of exercise, such as: ? Strengthening exercises. These are done to strengthen the muscles that support joints that are affected by arthritis. They can be performed with weights or with exercise bands to add resistance. ? Aerobic activities.  These are exercises, such as brisk walking or water aerobics, that get your heart pumping. ? Range-of-motion activities. These keep your joints easy to move. ? Balance and agility exercises. Managing pain, stiffness, and swelling      If directed, apply heat to the affected area as often as told by your health care provider. Use the heat source that your health care provider recommends, such as a moist heat pack or a heating pad. ? If you have a removable assistive device, remove it as told by your health care provider. ? Place a towel between your skin and the heat source. If your health care provider tells you to keep the assistive device on while you apply heat, place a towel between the assistive device and the heat source. ? Leave the heat on for 20-30 minutes. ? Remove the heat if your skin turns bright red. This is especially important if you are unable to feel pain, heat, or cold. You may have a greater risk of getting burned.  If directed, put ice on the affected joint: ? If you have a removable assistive device, remove it as told by your health care provider. ? Put ice in a plastic bag. ? Place a towel between your skin and the bag. If your health care provider tells you to keep the assistive device on during icing, place a towel between the assistive device and the bag. ? Leave the ice on for 20 minutes, 2-3 times a day. General instructions  Take over-the-counter and prescription medicines only as told by your health care provider.  Maintain a healthy weight. Follow instructions from your health care provider for weight control. These may include dietary restrictions.  Do not use any products that contain nicotine or tobacco, such as cigarettes and e-cigarettes. These can delay bone healing. If you need help quitting, ask your health care provider.  Use assistive devices as directed by your health care provider.  Keep all follow-up visits as told by your health care provider.  This is important. Where to find more information  [employer] of Arthritis and Musculoskeletal and Skin Diseases: www.niams.http://www.myers.net/  [employer] on Aging: https://walker.com/  American College of Rheumatology: www.rheumatology.org Contact a health care provider if:  Your skin turns red.  You develop a rash.  You have pain that gets worse.  You have a fever along with joint or muscle aches. Get help right away if:  You lose a lot of weight.  You suddenly lose your appetite.  You have night sweats. Summary  Osteoarthritis is a type of arthritis that affects tissue covering the ends of bones in joints (cartilage).  This condition is caused by age-related wearing down of cartilage that covers the ends of bones.  The main symptom of this condition is pain, swelling, and stiffness in the joint.  There is no cure for this condition, but treatment can help to control pain and improve joint function. This information is not intended to replace advice given to you by your health care provider. Make sure you discuss any questions you have with your health care provider. Document Released: 03/29/2005 Document Revised: 01/03/2017 Document Reviewed: 12/01/2015 Elsevier Interactive Patient Education  2019 [place].

## 2018-06-26 NOTE — Progress Notes (Signed)
Patient ID: Luis Larson, male    DOB: [birth year]  MRN: [id number]  CC: Hypertension and Diabetes   Subjective: Luis Larson is a 63 y.o. male who presents for chronic ds management His concerns today include:  Pt with hx of HTN, DM, HL, tob dep and ED  Out of all meds since 03/2019 partially due to financial restraints.  Would like to get refills today with 90-day supply on all medications. Limits salt in foods No CP/SOB Cut back to 1 pk cigarette a day from 2 pks/cigarette a day. Trying to quit.  Has patches and gum at home which he plans to start using the 1st of April.  Both he and his girlfriend plan to try to quit together.  DM:  Checks BS once a wk.  Range 90-160 Gave up pasta. Bread, pizza Gilrfriend is on [employer]   C/o pain in knees and shoulders x few yrs. Left greater than right.  Getting worse.  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  Wears knee pads Not taking anything for pain.    Patient Active Problem List   Diagnosis Date Noted  . Hyperlipidemia 12/16/2017  . Tobacco dependence 09/16/2017  . DM (diabetes mellitus) type 2, uncontrolled, with ketoacidosis (HCC) 10/04/2013  . Other male erectile dysfunction 10/04/2013  . Colon cancer screening 10/04/2013  . Diabetes (HCC) 12/22/2012  . Essential hypertension 12/22/2012  . Type II or unspecified type diabetes mellitus without mention of complication, uncontrolled 10/17/2012     Current Outpatient Medications on File Prior to Visit  Medication Sig Dispense Refill  . aspirin EC 81 MG tablet Take 1 tablet (81 mg total) by mouth daily. 30 tablet   . glucose blood test strip Use as instructed 100 each 12  . glucose monitoring kit (FREESTYLE) monitoring kit 1 each by Does not apply route as needed for other. 1 each 2   No current  facility-administered medications on file prior to visit.     No Known Allergies  Social History   Socioeconomic History  . Marital status: Divorced    Spouse name: Not on file  . Number of children: Not on file  . Years of education: Not on file  . Highest education level: Not on file  Occupational History  . Not on file  Social Needs  . Financial resource strain: Not on file  . Food insecurity:    Worry: Not on file    Inability: Not on file  . Transportation needs:    Medical: Not on file    Non-medical: Not on file  Tobacco Use  . Smoking status: Current Every Day Smoker    Packs/day: 2.00    Types: Cigarettes  . Smokeless tobacco: Never Used  Substance and Sexual Activity  . Alcohol use: No  . Drug use: No  . Sexual activity: Not on file  Lifestyle  . Physical activity:    Days per week: Not on file    Minutes per session: Not on file  . Stress: Not on file  Relationships  . Social connections:    Talks on phone: Not on file    Gets together: Not on file    Attends religious service: Not on file    Active member of club or organization: Not on file    Attends meetings  of clubs or organizations: Not on file    Relationship status: Not on file  . Intimate partner violence:    Fear of current or ex partner: Not on file    Emotionally abused: Not on file    Physically abused: Not on file    Forced sexual activity: Not on file  Other Topics Concern  . Not on file  Social History Narrative  . Not on file    Family History  Problem Relation Age of Onset  . Diabetes Mother   . Heart disease Mother   . Heart disease Father     No past surgical history on file.  ROS: Review of Systems Negative except as stated above  PHYSICAL EXAM: BP (!) 172/78   Pulse 64   Temp 98 F (36.7 C) (Oral)   Resp 16   Ht 5\' 11"  (1.803 m)   Wt (!) 303 lb 6.4 oz (137.6 kg)   SpO2 95%   BMI 42.32 kg/m   Wt Readings from Last 3 Encounters:  06/26/18 (!) 303 lb 6.4 oz  (137.6 kg)  04/13/18 (!) 319 lb (144.7 kg)  12/16/17 (!) 309 lb 3.2 oz (140.3 kg)   Physical Exam General appearance - alert, well appearing, and in no distress Mental status - normal mood, behavior, speech, dress, motor activity, and thought processes Mouth - mucous membranes moist, pharynx normal without lesions Neck - supple, no significant adenopathy Chest - clear to auscultation, no wheezes, rales or rhonchi, symmetric air entry Heart - normal rate, regular rhythm, normal S1, S2, no murmurs, rubs, clicks or gallops Extremities -no lower extremity edema. +Varicose veins RT>LT Dec sensation on Leap on soles MSK: Knees -mild enlargement of both joints with left greater than the right.  Good range of motion on the right.  Decreased passive range of motion on the left. Shoulders: Mild crepitus on passive movement of the left shoulder.  Good range of motion on the right. Results for orders placed or performed in visit on 06/26/18  POCT glucose (manual entry)  Result Value Ref Range   POC Glucose 151 (A) 70 - 99 mg/dl  POCT glycosylated hemoglobin (Hb A1C)  Result Value Ref Range   Hemoglobin A1C     HbA1c POC (<> result, manual entry)     HbA1c, POC (prediabetic range)     HbA1c, POC (controlled diabetic range) 7.0 0.0 - 7.0 %    CMP Latest Ref Rng & Units 09/16/2017 09/16/2014 06/06/2013  Glucose 65 - 99 mg/dL 161(W) 72 960(A)  BUN 8 - 27 mg/dL 12 14 14   Creatinine 0.76 - 1.27 mg/dL 5.40 9.81 1.91  Sodium 134 - 144 mmol/L 140 140 135  Potassium 3.5 - 5.2 mmol/L 4.7 4.6 4.4  Chloride 96 - 106 mmol/L 101 104 100  CO2 20 - 29 mmol/L 25 28 27   Calcium 8.6 - 10.2 mg/dL 9.7 9.2 9.7  Total Protein 6.0 - 8.5 g/dL 7.1 6.7 7.4  Total Bilirubin 0.0 - 1.2 mg/dL 0.3 0.4 0.5  Alkaline Phos 39 - 117 IU/L 105 87 77  AST 0 - 40 IU/L 20 15 20   ALT 0 - 44 IU/L 25 22 30    Lipid Panel     Component Value Date/Time   CHOL 155 09/16/2017 1648   TRIG 144 09/16/2017 1648   HDL 34 (L) 09/16/2017 1648    CHOLHDL 4.6 09/16/2017 1648   CHOLHDL 4.1 09/16/2014 1744   VLDL 22 09/16/2014 1744   LDLCALC 92 09/16/2017 1648  CBC    Component Value Date/Time   WBC 8.3 09/16/2017 1648   WBC 7.9 06/06/2013 1156   RBC 5.45 09/16/2017 1648   RBC 5.71 06/06/2013 1156   HGB 16.5 09/16/2017 1648   HCT 47.1 09/16/2017 1648   PLT 190 09/16/2017 1648   MCV 86 09/16/2017 1648   MCH 30.3 09/16/2017 1648   MCH 29.1 06/06/2013 1156   MCHC 35.0 09/16/2017 1648   MCHC 34.6 06/06/2013 1156   RDW 14.9 09/16/2017 1648   LYMPHSABS 2.4 06/06/2013 1156   MONOABS 0.6 06/06/2013 1156   EOSABS 0.2 06/06/2013 1156   BASOSABS 0.0 06/06/2013 1156    ASSESSMENT AND PLAN: 1. Controlled type 2 diabetes mellitus with diabetic polyneuropathy, without long-term current use of insulin (HCC) Close to goal.  Refill given on metformin and glimepiride. Commended him on trying to change his eating habits.  Encouraged to stay active. - POCT glucose (manual entry) - POCT glycosylated hemoglobin (Hb A1C) - glimepiride (AMARYL) 2 MG tablet; Take 1 tablet (2 mg total) by mouth daily before breakfast.  Dispense: 90 tablet; Refill: 3 - metFORMIN (GLUCOPHAGE) 500 MG tablet; Take 1 tablet (500 mg total) by mouth daily with breakfast.  Dispense: 90 tablet; Refill: 3  2. Essential hypertension Not at goal.  He has been out of lisinopril.  Refill given.  DASH diet encouraged. - lisinopril (PRINIVIL,ZESTRIL) 20 MG tablet; Take 1 tablet (20 mg total) by mouth daily.  Dispense: 90 tablet; Refill: 3  3. Vasculogenic erectile dysfunction, unspecified vasculogenic erectile dysfunction type - sildenafil (VIAGRA) 100 MG tablet; 1/2 to 1 tab PO 1 hr prior to intercourse PRN  Dispense: 30 tablet; Refill: 3  4. Class 3 severe obesity due to excess calories with serious comorbidity and body mass index (BMI) of 40.0 to 44.9 in adult [facility]) See #1 above.  5. Tobacco dependence Patient advised to quit smoking. Discussed health risks  associated with smoking including lung and other types of cancers, chronic lung diseases and CV risks.. Pt ready/not ready to give trail of quitting.  Discussed methods to help quit including quitting cold [place], use of NRT, Chantix and Bupropion.  Patient has purchased nicotine patches and gum and plans to start using them on the first of next month.  6. Hyperlipidemia, unspecified hyperlipidemia type - atorvastatin (LIPITOR) 20 MG tablet; Take 1 tablet (20 mg total) by mouth daily.  Dispense: 90 tablet; Refill: 3  7. Primary osteoarthritis of both knees Discussed the importance of continued weight loss. Offered to order x-rays of the knees and the shoulders but patient declines at this time.  He plans to apply for the orange card and cone discount and once approved we can order. - meloxicam (MOBIC) 15 MG tablet; Take 1 tablet (15 mg total) by mouth daily. For arthritis pain  Dispense: 90 tablet; Refill: 1  8. Primary osteoarthritis of left shoulder - meloxicam (MOBIC) 15 MG tablet; Take 1 tablet (15 mg total) by mouth daily. For arthritis pain  Dispense: 90 tablet; Refill: 1  Patient was given the opportunity to ask questions.  Patient verbalized understanding of the plan and was able to repeat key elements of the plan.   Orders Placed This Encounter  Procedures  . POCT glucose (manual entry)  . POCT glycosylated hemoglobin (Hb A1C)     Requested Prescriptions   Signed Prescriptions Disp Refills  . atorvastatin (LIPITOR) 20 MG tablet 90 tablet 3    Sig: Take 1 tablet (20 mg total) by mouth daily.  Marland Kitchen  glimepiride (AMARYL) 2 MG tablet 90 tablet 3    Sig: Take 1 tablet (2 mg total) by mouth daily before breakfast.  . lisinopril (PRINIVIL,ZESTRIL) 20 MG tablet 90 tablet 3    Sig: Take 1 tablet (20 mg total) by mouth daily.  . metFORMIN (GLUCOPHAGE) 500 MG tablet 90 tablet 3    Sig: Take 1 tablet (500 mg total) by mouth daily with breakfast.  . sildenafil (VIAGRA) 100 MG tablet 30  tablet 3    Sig: 1/2 to 1 tab PO 1 hr prior to intercourse PRN  . meloxicam (MOBIC) 15 MG tablet 90 tablet 1    Sig: Take 1 tablet (15 mg total) by mouth daily. For arthritis pain    Return in about 3 months (around 09/26/2018).  Jonah Blue, MD, FACP

## 2018-09-20 MED FILL — ATORVASTATIN 20 MG TABLET: 20 | 90 days supply | Qty: 90 | Fill #1

## 2018-09-20 MED FILL — MELOXICAM 15 MG TABLET: 15 | 90 days supply | Qty: 90 | Fill #1

## 2018-09-20 MED FILL — metFORMIN HCL 500 MG TABS: 500 | 90 days supply | Qty: 90 | Fill #1

## 2018-09-20 MED FILL — GLIMEPIRIDE 2 MG TABS: 2 | 90 days supply | Qty: 90 | Fill #1

## 2018-09-20 MED FILL — SILDENAFIL CITRATE 100 MG T: 100 | 90 days supply | Qty: 30 | Fill #1

## 2018-09-20 MED FILL — LISINOPRIL 20 MG TABLET: 20 | 90 days supply | Qty: 90 | Fill #1

## 2018-12-11 ENCOUNTER — Encounter: Payer: Self-pay | Admitting: Family Medicine

## 2018-12-11 ENCOUNTER — Ambulatory Visit: Payer: Self-pay | Attending: Family Medicine | Admitting: Family Medicine

## 2018-12-11 ENCOUNTER — Other Ambulatory Visit: Payer: Self-pay

## 2018-12-11 VITALS — BP 172/75 | HR 62 | Temp 98.3°F | Ht 71.0 in | Wt 305.2 lb

## 2018-12-11 DIAGNOSIS — E1110 Type 2 diabetes mellitus with ketoacidosis without coma: Secondary | ICD-10-CM

## 2018-12-11 DIAGNOSIS — G8929 Other chronic pain: Secondary | ICD-10-CM

## 2018-12-11 DIAGNOSIS — M25512 Pain in left shoulder: Secondary | ICD-10-CM

## 2018-12-11 DIAGNOSIS — I10 Essential (primary) hypertension: Secondary | ICD-10-CM

## 2018-12-11 DIAGNOSIS — M25511 Pain in right shoulder: Secondary | ICD-10-CM

## 2018-12-11 LAB — GLUCOSE, POCT (MANUAL RESULT ENTRY): POC Glucose: 80 mg/dl (ref 70–99)

## 2018-12-11 LAB — POCT GLYCOSYLATED HEMOGLOBIN (HGB A1C): HbA1c, POC (controlled diabetic range): 6.9 % (ref 0.0–7.0)

## 2018-12-11 MED ORDER — TRAMADOL HCL 50 MG PO TABS: 50.0000 mg | ORAL_TABLET | Freq: Every day | ORAL | 0 refills | Status: DC

## 2018-12-11 MED ORDER — AMLODIPINE BESYLATE 5 MG PO TABS: 5.0000 mg | ORAL_TABLET | Freq: Every day | ORAL | 1 refills | Status: DC

## 2018-12-11 MED FILL — LISINOPRIL 20 MG TABLET: 20 | 90 days supply | Qty: 90 | Fill #2

## 2018-12-11 MED FILL — traMADol HCL 50 MG TABS: 50 | 30 days supply | Qty: 30 | Fill #0

## 2018-12-11 MED FILL — SILDENAFIL CITRATE 100 MG T: 100 | 90 days supply | Qty: 30 | Fill #2

## 2018-12-11 MED FILL — AMLODIPINE BESYLATE 5 MG TA: 5 | 90 days supply | Qty: 90 | Fill #0

## 2018-12-11 MED FILL — metFORMIN HCL 500 MG TABS: 500 | 90 days supply | Qty: 90 | Fill #2

## 2018-12-11 MED FILL — ATORVASTATIN 20 MG TABLET: 20 | 90 days supply | Qty: 90 | Fill #2

## 2018-12-11 MED FILL — GLIMEPIRIDE 2 MG TABS: 2 | 90 days supply | Qty: 90 | Fill #2

## 2018-12-11 NOTE — Patient Instructions (Signed)
Shoulder Pain Many things can cause shoulder pain, including:  An injury.  Moving the shoulder in the same way again and again (overuse).  Joint pain (arthritis). Pain can come from:  Swelling and irritation (inflammation) of any part of the shoulder.  An injury to the shoulder joint.  An injury to: ? Tissues that connect muscle to bone (tendons). ? Tissues that connect bones to each other (ligaments). ? Bones. Follow these instructions at home: Watch for changes in your symptoms. Let your doctor know about them. Follow these instructions to help with your pain. If you have a sling:  Wear the sling as told by your doctor. Remove it only as told by your doctor.  Loosen the sling if your fingers: ? Tingle. ? Become numb. ? Turn cold and blue.  Keep the sling clean.  If the sling is not waterproof: ? Do not let it get wet. ? Take the sling off when you shower or bathe. Managing pain, stiffness, and swelling   If told, put ice on the painful area: ? Put ice in a plastic bag. ? Place a towel between your skin and the bag. ? Leave the ice on for 20 minutes, 2-3 times a day. Stop putting ice on if it does not help with the pain.  Squeeze a soft ball or a foam pad as much as possible. This prevents swelling in the shoulder. It also helps to strengthen the arm. General instructions  Take over-the-counter and prescription medicines only as told by your doctor.  Keep all follow-up visits as told by your doctor. This is important. Contact a doctor if:  Your pain gets worse.  Medicine does not help your pain.  You have new pain in your arm, hand, or fingers. Get help right away if:  Your arm, hand, or fingers: ? Tingle. ? Are numb. ? Are swollen. ? Are painful. ? Turn white or blue. Summary  Shoulder pain can be caused by many things. These include injury, moving the shoulder in the same away again and again, and joint pain.  Watch for changes in your symptoms.  Let your doctor know about them.  This condition may be treated with a sling, ice, and pain medicine.  Contact your doctor if the pain gets worse or you have new pain. Get help right away if your arm, hand, or fingers tingle or get numb, swollen, or painful.  Keep all follow-up visits as told by your doctor. This is important. This information is not intended to replace advice given to you by your health care provider. Make sure you discuss any questions you have with your health care provider. Document Released: 09/15/2007 Document Revised: 10/11/2017 Document Reviewed: 10/11/2017 Elsevier Patient Education  2020 [employer]  

## 2018-12-11 NOTE — Progress Notes (Signed)
Subjective:  Patient ID: Luis Larson, male    DOB: 18-Nov-1955  Age: 63 y.o. MRN: [id number]  CC: Diabetes   HPI Luis Larson is a 63 year old male with a history of hypertension, type 2 diabetes mellitus (A1c 6.9 from today), hyperlipidemia who presents today for follow-up of chronic medical conditions. He is requesting refills of his medication but endorses compliance even though his blood pressure is elevated today. With regards to his diabetes mellitus he denies hypoglycemia, numbness in extremities or visual concerns. He complains of bilateral shoulder pain, left greater than right which is worse at night and prevents him from sleeping to the point that he gets only 3 hours of sleep.  Endorses associated cracking sound.  Currently on meloxicam which is not helpful.  He does have problems with overhead motion.  Denies recent history of trauma.  His strength in his both upper extremities are normal.   Past Medical History:  Diagnosis Date  . Diabetes mellitus without complication (HCC)   . Essential hypertension, benign 12/22/2012    History reviewed. No pertinent surgical history.  Family History  Problem Relation Age of Onset  . Diabetes Mother   . Heart disease Mother   . Heart disease Father     No Known Allergies  Outpatient Medications Prior to Visit  Medication Sig Dispense Refill  . aspirin EC 81 MG tablet Take 1 tablet (81 mg total) by mouth daily. 30 tablet   . atorvastatin (LIPITOR) 20 MG tablet Take 1 tablet (20 mg total) by mouth daily. 90 tablet 3  . glimepiride (AMARYL) 2 MG tablet Take 1 tablet (2 mg total) by mouth daily before breakfast. 90 tablet 3  . glucose blood test strip Use as instructed 100 each 12  . glucose monitoring kit (FREESTYLE) monitoring kit 1 each by Does not apply route as needed for other. 1 each 2  . lisinopril (PRINIVIL,ZESTRIL) 20 MG tablet Take 1 tablet (20 mg total) by mouth daily. 90 tablet 3  . meloxicam (MOBIC) 15 MG tablet Take 1  tablet (15 mg total) by mouth daily. For arthritis pain 90 tablet 1  . metFORMIN (GLUCOPHAGE) 500 MG tablet Take 1 tablet (500 mg total) by mouth daily with breakfast. 90 tablet 3  . sildenafil (VIAGRA) 100 MG tablet 1/2 to 1 tab PO 1 hr prior to intercourse PRN 30 tablet 3   No facility-administered medications prior to visit.      ROS Review of Systems  Constitutional: Negative for activity change and appetite change.  HENT: Negative for sinus pressure and sore throat.   Eyes: Negative for visual disturbance.  Respiratory: Negative for cough, chest tightness and shortness of breath.   Cardiovascular: Negative for chest pain and leg swelling.  Gastrointestinal: Negative for abdominal distention, abdominal pain, constipation and diarrhea.  Endocrine: Negative.   Genitourinary: Negative for dysuria.  Musculoskeletal:       See hpi  Skin: Negative for rash.  Allergic/Immunologic: Negative.   Neurological: Negative for weakness, light-headedness and numbness.  Psychiatric/Behavioral: Negative for dysphoric mood and suicidal ideas.    Objective:  BP (!) 172/75   Pulse 62   Temp 98.3 F (36.8 C) (Oral)   Ht 5\' 11"  (1.803 m)   Wt (!) 305 lb 3.2 oz (138.4 kg)   SpO2 93%   BMI 42.57 kg/m   BP/Weight 12/11/2018 06/26/2018 04/13/2018  Systolic BP 172 172 151  Diastolic BP 75 78 76  Wt. (Lbs) 305.2 303.4 319  BMI 42.57 42.32 44.49  Physical Exam Constitutional:      Appearance: He is well-developed.  Cardiovascular:     Rate and Rhythm: Normal rate.     Heart sounds: Normal heart sounds. No murmur.  Pulmonary:     Effort: Pulmonary effort is normal.     Breath sounds: Normal breath sounds. No wheezing or rales.  Chest:     Chest wall: No tenderness.  Abdominal:     General: Bowel sounds are normal. There is no distension.     Palpations: Abdomen is soft. There is no mass.     Tenderness: There is no abdominal tenderness.  Musculoskeletal: Normal range of motion.      Comments: Crepitus on range of motion of both shoulder joints but full range of motion is achievable bilaterally but this is associated with tenderness. Negative Neer and Hawkins signs Normal motor strength bilateral  Neurological:     Mental Status: He is alert and oriented to person, place, and time.     CMP Latest Ref Rng & Units 09/16/2017 09/16/2014 06/06/2013  Glucose 65 - 99 mg/dL 366(Y) 72 403(K)  BUN 8 - 27 mg/dL 12 14 14   Creatinine 0.76 - 1.27 mg/dL 7.42 5.95 6.38  Sodium 134 - 144 mmol/L 140 140 135  Potassium 3.5 - 5.2 mmol/L 4.7 4.6 4.4  Chloride 96 - 106 mmol/L 101 104 100  CO2 20 - 29 mmol/L 25 28 27   Calcium 8.6 - 10.2 mg/dL 9.7 9.2 9.7  Total Protein 6.0 - 8.5 g/dL 7.1 6.7 7.4  Total Bilirubin 0.0 - 1.2 mg/dL 0.3 0.4 0.5  Alkaline Phos 39 - 117 IU/L 105 87 77  AST 0 - 40 IU/L 20 15 20   ALT 0 - 44 IU/L 25 22 30     Lipid Panel     Component Value Date/Time   CHOL 155 09/16/2017 1648   TRIG 144 09/16/2017 1648   HDL 34 (L) 09/16/2017 1648   CHOLHDL 4.6 09/16/2017 1648   CHOLHDL 4.1 09/16/2014 1744   VLDL 22 09/16/2014 1744   LDLCALC 92 09/16/2017 1648    CBC    Component Value Date/Time   WBC 8.3 09/16/2017 1648   WBC 7.9 06/06/2013 1156   RBC 5.45 09/16/2017 1648   RBC 5.71 06/06/2013 1156   HGB 16.5 09/16/2017 1648   HCT 47.1 09/16/2017 1648   PLT 190 09/16/2017 1648   MCV 86 09/16/2017 1648   MCH 30.3 09/16/2017 1648   MCH 29.1 06/06/2013 1156   MCHC 35.0 09/16/2017 1648   MCHC 34.6 06/06/2013 1156   RDW 14.9 09/16/2017 1648   LYMPHSABS 2.4 06/06/2013 1156   MONOABS 0.6 06/06/2013 1156   EOSABS 0.2 06/06/2013 1156   BASOSABS 0.0 06/06/2013 1156    Lab Results  Component Value Date   HGBA1C 6.9 12/11/2018    Assessment & Plan:   1. type 2 diabetes mellitus Controlled with A1c of 6.9 Counseled on Diabetic diet, my plate method, 756 minutes of moderate intensity exercise/week Keep blood sugar logs with fasting goals of 80-120 mg/dl, random  of less than 433 and in the event of sugars less than 60 mg/dl or greater than 295 mg/dl please notify the clinic ASAP. It is recommended that you undergo annual eye exams and annual foot exams. Pneumonia vaccine is recommended. - POCT glucose (manual entry) - POCT glycosylated hemoglobin (Hb A1C) - CMP14+EGFR - Microalbumin / creatinine urine ratio  2. Essential hypertension Uncontrolled Amlodipine added to regimen We have spoken to the pharmacy who  endorses he does have additional refills and he will be picking them up today - amLODipine (NORVASC) 5 MG tablet; Take 1 tablet (5 mg total) by mouth daily.  Dispense: 90 tablet; Refill: 1  3. Chronic pain of both shoulders Uncontrolled on meloxicam Tramadol added to be used at bedtime - traMADol (ULTRAM) 50 MG tablet; Take 1 tablet (50 mg total) by mouth at bedtime.  Dispense: 30 tablet; Refill: 0    Meds ordered this encounter  Medications  . amLODipine (NORVASC) 5 MG tablet    Sig: Take 1 tablet (5 mg total) by mouth daily.    Dispense:  90 tablet    Refill:  1    Patient requests a 90 day supply  . traMADol (ULTRAM) 50 MG tablet    Sig: Take 1 tablet (50 mg total) by mouth at bedtime.    Dispense:  30 tablet    Refill:  0    Follow-up: Return in about 3 months (around 03/12/2019) for PCP- Dr Laural Benes.       Hoy Register, MD, FAAFP. [facility] [place], [place] [phone]   12/11/2018, 2:44 PM

## 2018-12-12 LAB — CMP14+EGFR
ALT: 17 IU/L (ref 0–44)
AST: 16 IU/L (ref 0–40)
Albumin/Globulin Ratio: 1.9 (ref 1.2–2.2)
Albumin: 4.5 g/dL (ref 3.8–4.8)
Alkaline Phosphatase: 92 IU/L (ref 39–117)
BUN/Creatinine Ratio: 15 (ref 10–24)
BUN: 15 mg/dL (ref 8–27)
Bilirubin Total: 0.2 mg/dL (ref 0.0–1.2)
CO2: 30 mmol/L — ABNORMAL HIGH (ref 20–29)
Calcium: 9.2 mg/dL (ref 8.6–10.2)
Chloride: 101 mmol/L (ref 96–106)
Creatinine, Ser: 0.98 mg/dL (ref 0.76–1.27)
GFR calc Af Amer: 96 mL/min/{1.73_m2} (ref >59)
GFR calc non Af Amer: 83 mL/min/{1.73_m2} (ref >59)
Globulin, Total: 2.4 g/dL (ref 1.5–4.5)
Glucose: 74 mg/dL (ref 65–99)
Potassium: 5 mmol/L (ref 3.5–5.2)
Sodium: 143 mmol/L (ref 134–144)
Total Protein: 6.9 g/dL (ref 6.0–8.5)

## 2018-12-12 LAB — MICROALBUMIN / CREATININE URINE RATIO
Creatinine, Urine: 140.5 mg/dL
Microalb/Creat Ratio: 25 mg/g creat (ref 0–29)
Microalbumin, Urine: 34.7 ug/mL

## 2018-12-13 ENCOUNTER — Telehealth: Payer: Self-pay

## 2018-12-13 NOTE — Telephone Encounter (Signed)
Patient name and DOB has been verified Patient was informed of lab results. Patient had no questions.  

## 2018-12-13 NOTE — Telephone Encounter (Signed)
-----   Message from Charlott Rakes, MD sent at 12/12/2018  5:36 PM EDT ----- Please inform the patient that labs are normal. Thank you.

## 2019-01-17 ENCOUNTER — Other Ambulatory Visit: Payer: Self-pay | Admitting: Family Medicine

## 2019-01-17 DIAGNOSIS — M25511 Pain in right shoulder: Secondary | ICD-10-CM

## 2019-01-17 DIAGNOSIS — G8929 Other chronic pain: Secondary | ICD-10-CM

## 2019-01-23 MED FILL — traMADol HCL 50 MG TABS: 50 | 30 days supply | Qty: 30 | Fill #0

## 2019-02-23 ENCOUNTER — Other Ambulatory Visit: Payer: Self-pay | Admitting: Internal Medicine

## 2019-02-23 DIAGNOSIS — G8929 Other chronic pain: Secondary | ICD-10-CM

## 2019-02-26 NOTE — Telephone Encounter (Signed)
Please fill if appropriate.  

## 2019-02-27 MED FILL — traMADol HCL 50 MG TABS: 50 | 30 days supply | Qty: 30 | Fill #0

## 2019-02-27 NOTE — Telephone Encounter (Signed)
Luis Larson please contact pt and schedule an appointment. It can be tele or in person(pt preference)

## 2019-03-01 NOTE — Telephone Encounter (Signed)
Pt aware and appt scheduled.  

## 2019-03-19 ENCOUNTER — Encounter: Payer: Self-pay | Admitting: Internal Medicine

## 2019-03-19 ENCOUNTER — Ambulatory Visit: Payer: Self-pay | Attending: Internal Medicine | Admitting: Internal Medicine

## 2019-03-19 ENCOUNTER — Other Ambulatory Visit: Payer: Self-pay | Admitting: Family Medicine

## 2019-03-19 DIAGNOSIS — F17200 Nicotine dependence, unspecified, uncomplicated: Secondary | ICD-10-CM

## 2019-03-19 DIAGNOSIS — Z23 Encounter for immunization: Secondary | ICD-10-CM

## 2019-03-19 DIAGNOSIS — M25511 Pain in right shoulder: Secondary | ICD-10-CM

## 2019-03-19 DIAGNOSIS — F17210 Nicotine dependence, cigarettes, uncomplicated: Secondary | ICD-10-CM

## 2019-03-19 DIAGNOSIS — G8929 Other chronic pain: Secondary | ICD-10-CM

## 2019-03-19 DIAGNOSIS — I10 Essential (primary) hypertension: Secondary | ICD-10-CM

## 2019-03-19 DIAGNOSIS — M25512 Pain in left shoulder: Secondary | ICD-10-CM

## 2019-03-19 DIAGNOSIS — E119 Type 2 diabetes mellitus without complications: Secondary | ICD-10-CM

## 2019-03-19 DIAGNOSIS — Z125 Encounter for screening for malignant neoplasm of prostate: Secondary | ICD-10-CM

## 2019-03-19 HISTORY — DX: Other chronic pain: G89.29

## 2019-03-19 MED ORDER — TRAMADOL HCL 50 MG PO TABS: 50.0000 mg | ORAL_TABLET | Freq: Every day | ORAL | 0 refills | Status: DC

## 2019-03-19 MED FILL — metFORMIN HCL 500 MG TABS: 500 | 90 days supply | Qty: 90 | Fill #3

## 2019-03-19 MED FILL — GLIMEPIRIDE 2 MG TABS: 2 | 90 days supply | Qty: 90 | Fill #3

## 2019-03-19 MED FILL — ATORVASTATIN CALCIUM 20 MG: 20 | 90 days supply | Qty: 90 | Fill #3

## 2019-03-19 MED FILL — AMLODIPINE BESYLATE 5 MG TA: 5 | 90 days supply | Qty: 90 | Fill #1

## 2019-03-19 MED FILL — LISINOPRIL 20 MG TABLET: 20 | 90 days supply | Qty: 90 | Fill #3

## 2019-03-19 MED FILL — SILDENAFIL CITRATE 100 MG T: 100 | 90 days supply | Qty: 30 | Fill #3

## 2019-03-19 NOTE — Progress Notes (Signed)
Virtual Visit via Telephone Note Due to current restrictions/limitations of in-office visits due to the COVID-19 pandemic, this scheduled clinical appointment was converted to a telehealth visit  I connected with Luis Larson on 03/19/19 at 9:52 a.m  by telephone and verified that I am speaking with the correct person using two identifiers. I am in my office.  The patient is at home.  Only the patient and myself participated in this encounter.  I discussed the limitations, risks, security and privacy concerns of performing an evaluation and management service by telephone and the availability of in person appointments. I also discussed with the patient that there may be a patient responsible charge related to this service. The patient expressed understanding and agreed to proceed.   History of Present Illness: Pt with hx of HTN, DM, HL, tob dep, ED, OA knees and LT shoulder.  Patient last evaluated by me 06/2018.  Since then he saw Dr. Margarita Rana 11/2018.  OA knees/shoulder: When he saw Dr. Margarita Rana in August.  He complained of continued pain in shoulders not relieved with meloxicam.  He has been out of meloxicam for a while.  He has been using ibuprofen 600 mg twice during the day.  RT shoulder worse than LT.  Knees not too bothersome lately as is the shoulders.  Pain in shoulders worse at nights.  Tramadol was added at nights.  Finds this help.  Tobacco dependence: On last visit with me, patient wanted to give a trial of quitting.  He had purchased the nicotine patches and gum and that plan to start using them.  Today he tells me that he never got the gum.  He has the patches but not using consistently.  Still at 1 pk/day.  Afraid of wgh gain if he quit smoking  DM: Reports compliance with metformin and Amaryl. BS was 132 this a.m.  Checking BS once a wk.  Range has been 99-138. -he has cut back significantly on white carbs -does a lot of walking.  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 []  Yes    [x]  No Chest Pain?: []  Yes    [x]  No Leg swelling?: []  Yes    [x]  No Headaches?: []  Yes    [x]  No Dizziness? []  Yes    [x]  No Comments:     Current Outpatient Medications  Medication Instructions  . amLODipine (NORVASC) 5 mg, Oral, Daily  . aspirin EC 81 mg, Oral, Daily  . atorvastatin (LIPITOR) 20 mg, Oral, Daily  . glimepiride (AMARYL) 2 mg, Oral, Daily before breakfast  . glucose blood test strip Use as instructed  . glucose monitoring kit (FREESTYLE) monitoring kit 1 each, Does not apply, As needed  . lisinopril (ZESTRIL) 20 mg, Oral, Daily  . meloxicam (MOBIC) 15 mg, Oral, Daily, For arthritis pain  . metFORMIN (GLUCOPHAGE) 500 mg, Oral, Daily with breakfast  . sildenafil (VIAGRA) 100 MG tablet 1/2 to 1 tab PO 1 hr prior to intercourse PRN  . traMADol (ULTRAM) 50 mg, Oral, Daily at bedtime  Observations/Objective: Results for orders placed or performed in visit on 12/11/18  CMP14+EGFR  Result Value Ref Range   Glucose 74 65 - 99 mg/dL   BUN 15 8 - 27 mg/dL   Creatinine, Ser 0.98 0.76 - 1.27 mg/dL   GFR calc non Af Amer 83 >59 mL/min/1.73   GFR calc Af Amer 96 >59 mL/min/1.73   BUN/Creatinine Ratio 15 10 - 24   Sodium 143 134 - 144 mmol/L   Potassium 5.0 3.5 - 5.2 mmol/L   Chloride 101 96 - 106 mmol/L   CO2 30 (H) 20 - 29 mmol/L   Calcium 9.2 8.6 - 10.2 mg/dL   Total Protein 6.9 6.0 - 8.5 g/dL   Albumin 4.5 3.8 - 4.8 g/dL   Globulin, Total 2.4 1.5 - 4.5 g/dL   Albumin/Globulin Ratio 1.9 1.2 - 2.2   Bilirubin Total 0.2 0.0 - 1.2 mg/dL   Alkaline Phosphatase 92 39 - 117 IU/L   AST 16 0 - 40  IU/L   ALT 17 0 - 44 IU/L  Microalbumin / creatinine urine ratio  Result Value Ref Range   Creatinine, Urine 140.5 Not Estab. mg/dL   Microalbumin, Urine 34.7 Not Estab. ug/mL   Microalb/Creat Ratio 25 0 - 29 mg/g creat  POCT glucose (manual entry)  Result Value Ref Range   POC Glucose 80 70 - 99 mg/dl  POCT glycosylated hemoglobin (Hb A1C)  Result Value Ref Range   Hemoglobin A1C     HbA1c POC (<> result, manual entry)     HbA1c, POC (prediabetic range)     HbA1c, POC (controlled diabetic range) 6.9 0.0 - 7.0 %     Assessment and Plan: 1. Controlled type 2 diabetes mellitus without complication, without long-term current use of insulin ([place]) Not checking blood sugars often but reported blood sugars are good and last A1c was at goal.  He will continue Metformin.  He will come to the lab for an A1c. - Lipid panel; Future - CBC; Future - Hemoglobin A1c; Future  2. Essential hypertension Continue current medications and low-salt diet.  3. Chronic pain of both shoulders Advised patient that if he takes ibuprofen he should take it with food.  He will continue the tramadol at bedtime which helps to control the pain at nights to allow him to sleep.  I went over possible side effects of tramadol including constipation and drowsiness.  Advised patient that it is a narcotic medication and as such can become addictive or habit forming.  Advised of our policy/controlled substance prescribing agreement.  Patient is willing to adhere to it.  When he comes later this week to the lab, I have asked my medical assistant to give him this the controlled substance prescribing agreement for him to read and sign.  [place] controlled substance reporting system has been reviewed. - traMADol (ULTRAM) 50 MG tablet; Take 1 tablet (50 mg total) by mouth at bedtime. Fill on or after 03/28/2019  Dispense: 30 tablet; Refill: 0 - DG Shoulder Left; Future - DG Shoulder Right; Future  4. Tobacco  dependence Advised to quit.  Discussed health risks associated with smoking.  Encouraged him to get the nicotine gum to use in combination with the patches and to use them consistently.  Advised to purchase fruits and nuts to snack on as he is concerned of overeating once he tries to quit smoking.  Less than 5 minutes spent on counseling.  5. Need for immunization against influenza Patient will come later  this week for nurse only visit to get the flu vaccine  6. Prostate cancer screening He is due for prostate cancer screening.  He is agreeable to having PSA drawn - PSA; Future   Follow Up Instructions: 4 mths   I discussed the assessment and treatment plan with the patient. The patient was provided an opportunity to ask questions and all were answered. The patient agreed with the plan and demonstrated an understanding of the instructions.   The patient was advised to call back or seek an in-person evaluation if the symptoms worsen or if the condition fails to improve as anticipated.  I provided 17 minutes of non-face-to-face time during this encounter.   Karle Plumber, MD

## 2019-03-19 NOTE — Progress Notes (Signed)
Pt states his blood sugar this morning was 126

## 2019-03-23 ENCOUNTER — Other Ambulatory Visit: Payer: Self-pay

## 2019-03-23 ENCOUNTER — Ambulatory Visit: Payer: Self-pay | Attending: Internal Medicine | Admitting: Pharmacist

## 2019-03-23 DIAGNOSIS — E119 Type 2 diabetes mellitus without complications: Secondary | ICD-10-CM

## 2019-03-23 DIAGNOSIS — Z125 Encounter for screening for malignant neoplasm of prostate: Secondary | ICD-10-CM

## 2019-03-23 DIAGNOSIS — Z23 Encounter for immunization: Secondary | ICD-10-CM

## 2019-03-23 NOTE — Addendum Note (Signed)
Addended bySteffanie Dunn on: 03/23/2019 02:38 PM   Modules accepted: Orders

## 2019-03-23 NOTE — Progress Notes (Signed)
Patient presents for vaccination against influenza per orders of Dr. Johnson. Consent given. Counseling provided. No contraindications exists. Vaccine administered without incident.   

## 2019-03-24 LAB — CBC
Hematocrit: 43 % (ref 37.5–51.0)
Hemoglobin: 14.5 g/dL (ref 13.0–17.7)
MCH: 29.9 pg (ref 26.6–33.0)
MCHC: 33.7 g/dL (ref 31.5–35.7)
MCV: 89 fL (ref 79–97)
Platelets: 204 10*3/uL (ref 150–450)
RBC: 4.85 x10E6/uL (ref 4.14–5.80)
RDW: 13.5 % (ref 11.6–15.4)
WBC: 8.4 10*3/uL (ref 3.4–10.8)

## 2019-03-24 LAB — LIPID PANEL
Chol/HDL Ratio: 2.5 ratio (ref 0.0–5.0)
Cholesterol, Total: 98 mg/dL — ABNORMAL LOW (ref 100–199)
HDL: 39 mg/dL — ABNORMAL LOW (ref >39)
LDL Chol Calc (NIH): 37 mg/dL (ref 0–99)
Triglycerides: 119 mg/dL (ref 0–149)
VLDL Cholesterol Cal: 22 mg/dL (ref 5–40)

## 2019-03-24 LAB — HEMOGLOBIN A1C
Est. average glucose Bld gHb Est-mCnc: 143 mg/dL
Hgb A1c MFr Bld: 6.6 % — ABNORMAL HIGH (ref 4.8–5.6)

## 2019-03-24 LAB — PSA: Prostate Specific Ag, Serum: 1 ng/mL (ref 0.0–4.0)

## 2019-03-26 ENCOUNTER — Telehealth: Payer: Self-pay

## 2019-03-26 NOTE — Telephone Encounter (Signed)
Contacted pt to go over lab results pt is aware and doesn't have any questions or concerns 

## 2019-03-28 MED FILL — traMADol HCL 50 MG TABS: 50 | 30 days supply | Qty: 30 | Fill #0

## 2019-05-08 ENCOUNTER — Other Ambulatory Visit: Payer: Self-pay | Admitting: Internal Medicine

## 2019-05-08 DIAGNOSIS — M25512 Pain in left shoulder: Secondary | ICD-10-CM

## 2019-05-08 DIAGNOSIS — G8929 Other chronic pain: Secondary | ICD-10-CM

## 2019-05-09 NOTE — Telephone Encounter (Signed)
Please fill if appropriate.  

## 2019-05-10 ENCOUNTER — Emergency Department (HOSPITAL_BASED_OUTPATIENT_CLINIC_OR_DEPARTMENT_OTHER): Payer: Self-pay

## 2019-05-10 ENCOUNTER — Emergency Department (HOSPITAL_BASED_OUTPATIENT_CLINIC_OR_DEPARTMENT_OTHER)
Admission: EM | Admit: 2019-05-10 | Discharge: 2019-05-10 | Disposition: A | Discharge status: Home / Self Care | Payer: Self-pay | Attending: Emergency Medicine | Admitting: Emergency Medicine

## 2019-05-10 ENCOUNTER — Encounter (HOSPITAL_BASED_OUTPATIENT_CLINIC_OR_DEPARTMENT_OTHER): Payer: Self-pay

## 2019-05-10 ENCOUNTER — Ambulatory Visit (HOSPITAL_BASED_OUTPATIENT_CLINIC_OR_DEPARTMENT_OTHER)
Admission: RE | Admit: 2019-05-10 | Discharge: 2019-05-10 | Disposition: A | Discharge status: Home / Self Care | Payer: Self-pay | Source: Ambulatory Visit | Attending: Internal Medicine | Admitting: Internal Medicine

## 2019-05-10 ENCOUNTER — Telehealth: Payer: Self-pay | Admitting: Internal Medicine

## 2019-05-10 ENCOUNTER — Ambulatory Visit (HOSPITAL_BASED_OUTPATIENT_CLINIC_OR_DEPARTMENT_OTHER): Payer: Self-pay

## 2019-05-10 ENCOUNTER — Other Ambulatory Visit: Payer: Self-pay

## 2019-05-10 DIAGNOSIS — M25519 Pain in unspecified shoulder: Secondary | ICD-10-CM | POA: Insufficient documentation

## 2019-05-10 DIAGNOSIS — G8929 Other chronic pain: Secondary | ICD-10-CM

## 2019-05-10 DIAGNOSIS — M19011 Primary osteoarthritis, right shoulder: Secondary | ICD-10-CM

## 2019-05-10 DIAGNOSIS — M25512 Pain in left shoulder: Secondary | ICD-10-CM | POA: Insufficient documentation

## 2019-05-10 DIAGNOSIS — M19012 Primary osteoarthritis, left shoulder: Secondary | ICD-10-CM

## 2019-05-10 DIAGNOSIS — M25511 Pain in right shoulder: Secondary | ICD-10-CM | POA: Insufficient documentation

## 2019-05-10 MED ORDER — TRAMADOL HCL 50 MG PO TABS: 50.0000 mg | ORAL_TABLET | Freq: Every day | ORAL | 1 refills | Status: DC

## 2019-05-10 NOTE — ED Triage Notes (Signed)
Pt reports chronic pain to bilat shoulder-states his PCP "the clinic" advised him to come to ED to have xrays before they would refill tramadol rx-NAD-steady gait

## 2019-05-10 NOTE — Telephone Encounter (Signed)
Pt will be coming to see me on Friday to sign control substance agreement

## 2019-05-10 NOTE — ED Provider Notes (Signed)
Patient here for x-rays of his shoulders.  His primary care doctor had ordered these back in December.  I can see the orders in epic.  He mistakenly checked into the ED instead of going over to radiology to have his routine outpatient imaging performed.  I checked with the patient to ensure that he is just here for x-rays and he confirms this.  No exam.  Patient will be directed out of the ED to radiology to have his outpatient studies performed.   Renne Crigler, PA-C 05/10/19 1151    Terrilee Files, MD 05/10/19 [phone]

## 2019-05-11 ENCOUNTER — Ambulatory Visit: Payer: Self-pay

## 2019-05-11 MED FILL — traMADol HCL 50 MG TABS: 50 | 30 days supply | Qty: 30 | Fill #0

## 2019-05-11 NOTE — Telephone Encounter (Signed)
Pt is aware of xray results pt would like referral to ortho and pt has been provided the oc/cone discount application and will call Monday for an appointment

## 2019-06-11 MED FILL — traMADol HCL 50 MG TABS: 50 | 30 days supply | Qty: 30 | Fill #1

## 2019-07-02 ENCOUNTER — Other Ambulatory Visit: Payer: Self-pay | Admitting: Family Medicine

## 2019-07-02 ENCOUNTER — Other Ambulatory Visit: Payer: Self-pay | Admitting: Internal Medicine

## 2019-07-02 DIAGNOSIS — I10 Essential (primary) hypertension: Secondary | ICD-10-CM

## 2019-07-02 DIAGNOSIS — E1142 Type 2 diabetes mellitus with diabetic polyneuropathy: Secondary | ICD-10-CM

## 2019-07-02 DIAGNOSIS — G8929 Other chronic pain: Secondary | ICD-10-CM

## 2019-07-02 DIAGNOSIS — E785 Hyperlipidemia, unspecified: Secondary | ICD-10-CM

## 2019-07-02 DIAGNOSIS — N529 Male erectile dysfunction, unspecified: Secondary | ICD-10-CM

## 2019-07-03 MED FILL — SILDENAFIL CITRATE 100 MG T: 100 | 30 days supply | Qty: 10 | Fill #0

## 2019-07-03 MED FILL — GLIMEPIRIDE 2 MG TABS: 2 | 30 days supply | Qty: 30 | Fill #0

## 2019-07-03 MED FILL — metFORMIN HCL 500 MG TABS: 500 | 30 days supply | Qty: 30 | Fill #0

## 2019-07-03 MED FILL — AMLODIPINE BESYLATE 5 MG TA: 5 | 30 days supply | Qty: 30 | Fill #0

## 2019-07-03 MED FILL — ATORVASTATIN CALCIUM 20 MG: 20 | 30 days supply | Qty: 30 | Fill #0

## 2019-07-03 MED FILL — LISINOPRIL 20 MG TABLET: 20 | 30 days supply | Qty: 30 | Fill #0

## 2019-07-19 ENCOUNTER — Other Ambulatory Visit: Payer: Self-pay | Admitting: Internal Medicine

## 2019-07-19 DIAGNOSIS — M25511 Pain in right shoulder: Secondary | ICD-10-CM

## 2019-07-19 DIAGNOSIS — G8929 Other chronic pain: Secondary | ICD-10-CM

## 2019-07-19 MED FILL — traMADol HCL 50 MG TABS: 50 | 30 days supply | Qty: 30 | Fill #0

## 2019-07-24 ENCOUNTER — Encounter: Payer: Self-pay | Admitting: Internal Medicine

## 2019-07-24 ENCOUNTER — Ambulatory Visit: Payer: Self-pay | Attending: Internal Medicine | Admitting: Internal Medicine

## 2019-07-24 ENCOUNTER — Other Ambulatory Visit: Payer: Self-pay

## 2019-07-24 ENCOUNTER — Other Ambulatory Visit: Payer: Self-pay | Admitting: Internal Medicine

## 2019-07-24 VITALS — BP 158/74 | HR 58 | Temp 97.7°F | Resp 16 | Wt 314.6 lb

## 2019-07-24 DIAGNOSIS — M19012 Primary osteoarthritis, left shoulder: Secondary | ICD-10-CM | POA: Insufficient documentation

## 2019-07-24 DIAGNOSIS — I10 Essential (primary) hypertension: Secondary | ICD-10-CM

## 2019-07-24 DIAGNOSIS — M19011 Primary osteoarthritis, right shoulder: Secondary | ICD-10-CM | POA: Insufficient documentation

## 2019-07-24 DIAGNOSIS — F17200 Nicotine dependence, unspecified, uncomplicated: Secondary | ICD-10-CM

## 2019-07-24 DIAGNOSIS — E785 Hyperlipidemia, unspecified: Secondary | ICD-10-CM

## 2019-07-24 DIAGNOSIS — E119 Type 2 diabetes mellitus without complications: Secondary | ICD-10-CM

## 2019-07-24 DIAGNOSIS — Z79899 Other long term (current) drug therapy: Secondary | ICD-10-CM

## 2019-07-24 HISTORY — DX: Morbid (severe) obesity due to excess calories: E66.01

## 2019-07-24 HISTORY — DX: Primary osteoarthritis, right shoulder: M19.012

## 2019-07-24 LAB — POCT GLYCOSYLATED HEMOGLOBIN (HGB A1C): HbA1c, POC (controlled diabetic range): 7.5 % — AB (ref 0.0–7.0)

## 2019-07-24 LAB — GLUCOSE, POCT (MANUAL RESULT ENTRY): POC Glucose: 184 mg/dl — AB (ref 70–99)

## 2019-07-24 MED ORDER — AMLODIPINE BESYLATE 10 MG PO TABS: 10.0000 mg | ORAL_TABLET | Freq: Every day | ORAL | 6 refills | Status: DC

## 2019-07-24 MED ORDER — ATORVASTATIN CALCIUM 20 MG PO TABS: 20.0000 mg | ORAL_TABLET | Freq: Every day | ORAL | 6 refills | Status: DC

## 2019-07-24 MED ORDER — CELECOXIB 200 MG PO CAPS: 200.0000 mg | ORAL_CAPSULE | Freq: Every day | ORAL | 3 refills | Status: DC

## 2019-07-24 MED ORDER — TRAMADOL HCL 50 MG PO TABS: 100.0000 mg | ORAL_TABLET | Freq: Every day | ORAL | 0 refills | Status: DC

## 2019-07-24 MED ORDER — GLIMEPIRIDE 2 MG PO TABS: 2.0000 mg | ORAL_TABLET | Freq: Every day | ORAL | 6 refills | Status: DC

## 2019-07-24 MED ORDER — METFORMIN HCL 500 MG PO TABS: 500.0000 mg | ORAL_TABLET | Freq: Two times a day (BID) | ORAL | 6 refills | Status: DC

## 2019-07-24 MED FILL — CELECOXIB 200 MG CAPSULE: 200 | 30 days supply | Qty: 30 | Fill #0

## 2019-07-24 NOTE — Patient Instructions (Signed)
Increase Metformin to 500 mg twice a day. Increase amlodipine to 10 mg daily. We have increased the tramadol.  We have added a medication called Celebrex to help with your arthritis pain.  Once you have been approved for the orange card or the cone discount card, please let me know so that we can submit a referral for you to see the orthopedics. Continue to work on trying to cut back on cigarette smoking.

## 2019-07-24 NOTE — Progress Notes (Signed)
Patient ID: Luis Larson, male    DOB: [birth year]  MRN: [id number]  CC: Diabetes and Hypertension   Subjective: Luis Larson is a 64 y.o. male who presents for chronic disease management His concerns today include:  Pt with hx of HTN, DM,HL,tob dep, ED, OA knees and shoulders.   OA shoulders:  Shoulders still very bothersome. Feels RT getting worse. One Tramadol at nights not allowing him to rest well without pain.  Has to take Benadryl and another over-the-counter pain medication and even then he is uncomfortable.  Not taking Ibuprofen as much.  Really started doing was skipping a day of taking tramadol so that the following day he can take 2 at bedtime.  Currently out of tramadol.  Requesting dose increase.  The x-rays of his shoulders revealed osteoarthritis changes.  I referred him to orthopedics but no appointment as he does not have the orange card at [facility] discount.  He does have the completed forms with supporting documents with him today.  He needs an appointment with our financial counselor to see if he can get approved. 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  He applied for 1 and was approved for his Tree surgeon.Marland Kitchen   DIABETES TYPE 2 Last A1C:   Results for orders placed or performed in visit on 07/24/19  POCT glucose (manual entry)  Result Value Ref Range   POC Glucose 184 (A) 70 - 99 mg/dl  POCT glycosylated hemoglobin (Hb A1C)  Result Value Ref Range   Hemoglobin A1C     HbA1c POC (<> result, manual entry)     HbA1c, POC (prediabetic range)     HbA1c, POC (controlled diabetic range) 7.5 (A) 0.0 - 7.0 %   A1C: 7.5 Med Adherence:  [x]  Yes.  On Metformin 500 mg daily    []  No Medication side effects:  []  Yes    []  No Home Monitoring?  [x]  Yes  - 2 x/wk  []  No Home glucose results range: 170s Diet Adherence: []  Yes    [x]  No -too much white carbs Exercise:  "Not much.   I know I need to start walking."  Plans to start walking at the park.  Weight is up 6 pounds since January of this year. Hypoglycemic episodes?: []  Yes    [x]  No Numbness of the feet? [x]  Yes  But has a spot on lateral ankle RT foot Retinopathy hx? []  Yes    []  No Last eye exam: no blurred vision.  Overdue for eye exam. Comments:   HYPERTENSION Currently taking: see medication list Med Adherence: [x]  Yes.  On lisinopril and Norvasc    []  No Medication side effects: []  Yes    [x]  No Adherence with salt restriction: [x]  Yes    []  No Home Monitoring?: []  Yes    [x]  No Monitoring Frequency: []  Yes    []  No Home BP results range: []  Yes    []  No SOB? [x]  Yes from smoking    []  No Chest Pain?: []  Yes    [x]  No Leg swelling?: []  Yes    [x]  No Headaches?: []  Yes    [x]  No Dizziness? []  Yes    [x]  No Comments:   Tob dep:  "I've cut back."  He was at 2 pks/day to 1 pk/day.  Smoked for 45 yrs. he is afraid of gaining more weight if  he quit smoking completely.  Patient Active Problem List   Diagnosis Date Noted  . Chronic pain of both shoulders 03/19/2019  . Hyperlipidemia 12/16/2017  . Tobacco dependence 09/16/2017  . Other male erectile dysfunction 10/04/2013  . Colon cancer screening 10/04/2013  . Diabetes (HCC) 12/22/2012  . Essential hypertension 12/22/2012  . Type II or unspecified type diabetes mellitus without mention of complication, uncontrolled 10/17/2012     Current Outpatient Medications on File Prior to Visit  Medication Sig Dispense Refill  . aspirin EC 81 MG tablet Take 1 tablet (81 mg total) by mouth daily. 30 tablet   . glucose blood test strip Use as instructed 100 each 12  . glucose monitoring kit (FREESTYLE) monitoring kit 1 each by Does not apply route as needed for other. 1 each 2  . lisinopril (ZESTRIL) 20 MG tablet TAKE 1 TABLET (20 MG TOTAL) BY MOUTH DAILY. 30 tablet 1  . sildenafil (VIAGRA) 100 MG tablet TAKE 1/2 TO 1 TAB BY MOUTH 1 HOUR PRIOR TO INTERCOURSE  AS NEEDED 10 tablet 1   No current facility-administered medications on file prior to visit.    No Known Allergies  Social History   Socioeconomic History  . Marital status: Divorced    Spouse name: Not on file  . Number of children: Not on file  . Years of education: Not on file  . Highest education level: Not on file  Occupational History  . Not on file  Tobacco Use  . Smoking status: Current Every Day Smoker    Packs/day: 2.00    Types: Cigarettes  . Smokeless tobacco: Never Used  Substance and Sexual Activity  . Alcohol use: No  . Drug use: No  . Sexual activity: Not on file  Other Topics Concern  . Not on file  Social History Narrative  . Not on file   Social Determinants of Health   Financial Resource Strain:   . Difficulty of Paying Living Expenses:   Food Insecurity:   . Worried About Programme researcher, broadcasting/film/video in the Last Year:   . Barista in the Last Year:   Transportation Needs:   . Freight forwarder (Medical):   Marland Kitchen Lack of Transportation (Non-Medical):   Physical Activity:   . Days of Exercise per Week:   . Minutes of Exercise per Session:   Stress:   . Feeling of Stress :   Social Connections:   . Frequency of Communication with Friends and Family:   . Frequency of Social Gatherings with Friends and Family:   . Attends Religious Services:   . Active Member of Clubs or Organizations:   . Attends Banker Meetings:   Marland Kitchen Marital Status:   Intimate Partner Violence:   . Fear of Current or Ex-Partner:   . Emotionally Abused:   Marland Kitchen Physically Abused:   . Sexually Abused:     Family History  Problem Relation Age of Onset  . Diabetes Mother   . Heart disease Mother   . Heart disease Father     No past surgical history on file.  ROS: Review of Systems Negative except as stated above  PHYSICAL EXAM: BP (!) 158/74   Pulse (!) 58   Temp 97.7 F (36.5 C)   Resp 16   Wt (!) 314 lb 9.6 oz (142.7 kg)   SpO2 95%   BMI 43.88  kg/m   Wt Readings from Last 3 Encounters:  07/24/19 (!) 314 lb 9.6 oz (  142.7 kg)  05/10/19 (!) 308 lb (139.7 kg)  12/11/18 (!) 305 lb 3.2 oz (138.4 kg)    Physical Exam General appearance - alert, well appearing, and in no distress Mental status - normal mood, behavior, speech, dress, motor activity, and thought processes  Neck - supple, no significant adenopathy Chest - clear to auscultation, no wheezes, rales or rhonchi, symmetric air entry Heart - normal rate, regular rhythm, normal S1, S2, no murmurs, rubs, clicks or gallops Musculoskeletal -  Extremities - peripheral pulses normal, no pedal edema, no clubbing or cyanosis Diabetic Foot Exam - Simple   Simple Foot Form Visual Inspection No deformities, no ulcerations, no other skin breakdown bilaterally: Yes Sensation Testing Intact to touch and monofilament testing bilaterally: Yes Pulse Check Posterior Tibialis and Dorsalis pulse intact bilaterally: Yes Comments     CMP Latest Ref Rng & Units 12/11/2018 09/16/2017 09/16/2014  Glucose 65 - 99 mg/dL 74 956(O) 72  BUN 8 - 27 mg/dL 15 12 14   Creatinine 0.76 - 1.27 mg/dL 1.30 8.65 7.84  Sodium 134 - 144 mmol/L 143 140 140  Potassium 3.5 - 5.2 mmol/L 5.0 4.7 4.6  Chloride 96 - 106 mmol/L 101 101 104  CO2 20 - 29 mmol/L 30(H) 25 28  Calcium 8.6 - 10.2 mg/dL 9.2 9.7 9.2  Total Protein 6.0 - 8.5 g/dL 6.9 7.1 6.7  Total Bilirubin 0.0 - 1.2 mg/dL 0.2 0.3 0.4  Alkaline Phos 39 - 117 IU/L 92 105 87  AST 0 - 40 IU/L 16 20 15   ALT 0 - 44 IU/L 17 25 22    Lipid Panel     Component Value Date/Time   CHOL 98 (L) 03/23/2019 1457   TRIG 119 03/23/2019 1457   HDL 39 (L) 03/23/2019 1457   CHOLHDL 2.5 03/23/2019 1457   CHOLHDL 4.1 09/16/2014 1744   VLDL 22 09/16/2014 1744   LDLCALC 37 03/23/2019 1457    CBC    Component Value Date/Time   WBC 8.4 03/23/2019 1457   WBC 7.9 06/06/2013 1156   RBC 4.85 03/23/2019 1457   RBC 5.71 06/06/2013 1156   HGB 14.5 03/23/2019 1457   HCT 43.0  03/23/2019 1457   PLT 204 03/23/2019 1457   MCV 89 03/23/2019 1457   MCH 29.9 03/23/2019 1457   MCH 29.1 06/06/2013 1156   MCHC 33.7 03/23/2019 1457   MCHC 34.6 06/06/2013 1156   RDW 13.5 03/23/2019 1457   LYMPHSABS 2.4 06/06/2013 1156   MONOABS 0.6 06/06/2013 1156   EOSABS 0.2 06/06/2013 1156   BASOSABS 0.0 06/06/2013 1156    ASSESSMENT AND PLAN: 1. Type 2 diabetes mellitus without complication, without long-term current use of insulin (HCC) Not at goal. Dietary counseling given. Encouraged him to start putting his plan of walking daily in the park to work. Increase Metformin to 500 mg twice a day. - metFORMIN (GLUCOPHAGE) 500 MG tablet; Take 1 tablet (500 mg total) by mouth 2 (two) times daily with a meal.  Dispense: 60 tablet; Refill: 6 - glimepiride (AMARYL) 2 MG tablet; Take 1 tablet (2 mg total) by mouth daily before breakfast.  Dispense: 30 tablet; Refill: 6  2. Essential hypertension Not at goal.  Increase Norvasc to 10 mg daily.  Continue lisinopril - amLODipine (NORVASC) 10 MG tablet; Take 1 tablet (10 mg total) by mouth daily.  Dispense: 30 tablet; Refill: 6  3. Primary osteoarthritis of both shoulders Pain not well controlled with tramadol 50 mg at bedtime.  We will add Celebrex for  him to take in the mornings.  Increase tramadol to 2 tablets at bedtime. [place] controlled substance reporting system reviewed. Controlled substance prescribing agreement updated earlier this year. - celecoxib (CELEBREX) 200 MG capsule; Take 1 capsule (200 mg total) by mouth daily.  Dispense: 30 capsule; Refill: 3 - traMADol (ULTRAM) 50 MG tablet; Take 2 tablets (100 mg total) by mouth at bedtime. To fill on or after 07/10/2019  Dispense: 60 tablet; Refill: 0  4. Morbid obesity (HCC) See #1 above  5. Hyperlipidemia, unspecified hyperlipidemia type - atorvastatin (LIPITOR) 20 MG tablet; Take 1 tablet (20 mg total) by mouth daily.  Dispense: 30 tablet; Refill: 6  6. Tobacco  dependence Strongly advised to quit smoking.  He is at increased risk for cardiovascular events and for lung cancer.  He is agreeable for lung cancer screening with low-dose CT scan of the chest once he is approved for the orange card/[employer] discount.  Less than 5 minutes spent on counseling.  7. Controlled substance agreement signed - [id number] 11+Oxyco+Alc+Crt-Bund      Patient was given the opportunity to ask questions.  Patient verbalized understanding of the plan and was able to repeat key elements of the plan.   Orders Placed This Encounter  Procedures  . [id number] 11+Oxyco+Alc+Crt-Bund  . POCT glucose (manual entry)  . POCT glycosylated hemoglobin (Hb A1C)     Requested Prescriptions   Signed Prescriptions Disp Refills  . celecoxib (CELEBREX) 200 MG capsule 30 capsule 3    Sig: Take 1 capsule (200 mg total) by mouth daily.  . traMADol (ULTRAM) 50 MG tablet 60 tablet 0    Sig: Take 2 tablets (100 mg total) by mouth at bedtime. To fill on or after 07/10/2019  . metFORMIN (GLUCOPHAGE) 500 MG tablet 60 tablet 6    Sig: Take 1 tablet (500 mg total) by mouth 2 (two) times daily with a meal.  . amLODipine (NORVASC) 10 MG tablet 30 tablet 6    Sig: Take 1 tablet (10 mg total) by mouth daily.  Marland Kitchen atorvastatin (LIPITOR) 20 MG tablet 30 tablet 6    Sig: Take 1 tablet (20 mg total) by mouth daily.  Marland Kitchen glimepiride (AMARYL) 2 MG tablet 30 tablet 6    Sig: Take 1 tablet (2 mg total) by mouth daily before breakfast.    Return in about 4 months (around 11/23/2019).  Jonah Blue, MD, FACP

## 2019-07-27 MED FILL — LISINOPRIL 20 MG TABLET: 20 | 30 days supply | Qty: 30 | Fill #1

## 2019-07-27 MED FILL — METFORMIN HCL 500 MG TABS: 500 | 30 days supply | Qty: 60 | Fill #0

## 2019-07-27 MED FILL — GLIMEPIRIDE 2 MG TABS: 2 | 30 days supply | Qty: 30 | Fill #0

## 2019-07-27 MED FILL — ATORVASTATIN CALCIUM 20 MG: 20 | 30 days supply | Qty: 30 | Fill #0

## 2019-07-27 MED FILL — AMLODIPINE BESYLATE 10 MG T: 10 | 30 days supply | Qty: 30 | Fill #0

## 2019-07-30 LAB — CANNABINOID CONFIRMATION, UR
CANNABINOIDS: POSITIVE — AB
Carboxy THC GC/MS Conf: 58 ng/mL

## 2019-07-30 LAB — DRUG SCREEN 764883 11+OXYCO+ALC+CRT-BUND
Amphetamines, Urine: NEGATIVE ng/mL
BENZODIAZ UR QL: NEGATIVE ng/mL
Barbiturate: NEGATIVE ng/mL
Cocaine (Metabolite): NEGATIVE ng/mL
Creatinine: 66.8 mg/dL (ref 20.0–300.0)
Ethanol: NEGATIVE %
Meperidine: NEGATIVE ng/mL
Methadone Screen, Urine: NEGATIVE ng/mL
OPIATE SCREEN URINE: NEGATIVE ng/mL
Oxycodone/Oxymorphone, Urine: NEGATIVE ng/mL
Phencyclidine: NEGATIVE ng/mL
Propoxyphene: NEGATIVE ng/mL
Tramadol: NEGATIVE ng/mL
pH, Urine: 5.7 (ref 4.5–8.9)

## 2019-08-01 ENCOUNTER — Telehealth: Payer: Self-pay | Admitting: Internal Medicine

## 2019-08-01 NOTE — Telephone Encounter (Signed)
Phone call placed to patient today.  Patient informed that his urine drug screen came back positive for cannabis.  Patient tells me that he does smoke marijuana.  I informed him that with our controlled substance prescribing agreement we recommend no use of street drugs while on prescription opioids.  I will continue to prescribe the tramadol for the arthritis in his shoulder but if on future drug screen he is positive again we may need to reconsider.  Patient expressed understanding of the plan.

## 2019-08-03 MED FILL — traMADol HCL 50 MG TABS: 50 | 30 days supply | Qty: 60 | Fill #0

## 2019-08-06 ENCOUNTER — Ambulatory Visit: Payer: Self-pay

## 2019-09-03 ENCOUNTER — Other Ambulatory Visit: Payer: Self-pay | Admitting: Internal Medicine

## 2019-09-03 DIAGNOSIS — I10 Essential (primary) hypertension: Secondary | ICD-10-CM

## 2019-09-03 MED FILL — ATORVASTATIN CALCIUM 20 MG: 20 | 30 days supply | Qty: 30 | Fill #1

## 2019-09-03 MED FILL — GLIMEPIRIDE 2 MG TABS: 2 | 30 days supply | Qty: 30 | Fill #1

## 2019-09-03 MED FILL — METFORMIN HCL 500 MG TABS: 500 | 30 days supply | Qty: 60 | Fill #1

## 2019-09-03 MED FILL — AMLODIPINE BESYLATE 10 MG T: 10 | 30 days supply | Qty: 30 | Fill #1

## 2019-09-03 NOTE — Telephone Encounter (Signed)
Please fill if appropriate.  

## 2019-09-04 MED FILL — LISINOPRIL 20 MG TABLET: 20 | 30 days supply | Qty: 30 | Fill #0

## 2019-10-01 ENCOUNTER — Other Ambulatory Visit: Payer: Self-pay | Admitting: Internal Medicine

## 2019-10-01 DIAGNOSIS — M19012 Primary osteoarthritis, left shoulder: Secondary | ICD-10-CM

## 2019-10-01 MED FILL — METFORMIN HCL 500 MG TABS: 500 | 30 days supply | Qty: 60 | Fill #2

## 2019-10-01 MED FILL — GLIMEPIRIDE 2 MG TABS: 2 | 30 days supply | Qty: 30 | Fill #2

## 2019-10-01 MED FILL — ATORVASTATIN CALCIUM 20 MG: 20 | 30 days supply | Qty: 30 | Fill #2

## 2019-10-01 MED FILL — LISINOPRIL 20 MG TABLET: 20 | 30 days supply | Qty: 30 | Fill #1

## 2019-10-01 MED FILL — AMLODIPINE BESYLATE 10 MG T: 10 | 30 days supply | Qty: 30 | Fill #2

## 2019-10-02 MED FILL — traMADol HCL 50 MG TABS: 50 | 30 days supply | Qty: 60 | Fill #0

## 2019-11-12 MED FILL — METFORMIN HCL 500 MG TABS: 500 | 30 days supply | Qty: 60 | Fill #3

## 2019-11-12 MED FILL — ATORVASTATIN CALCIUM 20 MG: 20 | 30 days supply | Qty: 30 | Fill #3

## 2019-11-12 MED FILL — AMLODIPINE BESYLATE 10 MG T: 10 | 30 days supply | Qty: 30 | Fill #3

## 2019-11-12 MED FILL — LISINOPRIL 20 MG TABLET: 20 | 30 days supply | Qty: 30 | Fill #2

## 2019-11-12 MED FILL — GLIMEPIRIDE 2 MG TABS: 2 | 30 days supply | Qty: 30 | Fill #3

## 2019-12-12 MED FILL — ATORVASTATIN CALCIUM 20 MG: 20 | 30 days supply | Qty: 30 | Fill #4

## 2019-12-12 MED FILL — LISINOPRIL 20 MG TABLET: 20 | 30 days supply | Qty: 30 | Fill #3

## 2019-12-12 MED FILL — METFORMIN HCL 500 MG TABS: 500 | 30 days supply | Qty: 60 | Fill #4

## 2019-12-12 MED FILL — AMLODIPINE BESYLATE 10 MG T: 10 | 30 days supply | Qty: 30 | Fill #4

## 2019-12-12 MED FILL — GLIMEPIRIDE 2 MG TABS: 2 | 30 days supply | Qty: 30 | Fill #4

## 2020-01-07 MED FILL — GLIMEPIRIDE 2 MG TABS: 2 | 30 days supply | Qty: 30 | Fill #5

## 2020-01-07 MED FILL — LISINOPRIL 20 MG TABLET: 20 | 30 days supply | Qty: 30 | Fill #4

## 2020-01-07 MED FILL — AMLODIPINE BESYLATE 10 MG T: 10 | 30 days supply | Qty: 30 | Fill #5

## 2020-01-07 MED FILL — ATORVASTATIN CALCIUM 20 MG: 20 | 30 days supply | Qty: 30 | Fill #5

## 2020-02-11 MED FILL — GLIMEPIRIDE 2 MG TABS: 2 | 30 days supply | Qty: 30 | Fill #6

## 2020-02-11 MED FILL — LISINOPRIL 20 MG TABLET: 20 | 30 days supply | Qty: 30 | Fill #5

## 2020-02-11 MED FILL — ATORVASTATIN CALCIUM 20 MG: 20 | 30 days supply | Qty: 30 | Fill #6

## 2020-02-11 MED FILL — AMLODIPINE BESYLATE 10 MG T: 10 | 30 days supply | Qty: 30 | Fill #6

## 2020-03-13 ENCOUNTER — Other Ambulatory Visit: Payer: Self-pay | Admitting: Internal Medicine

## 2020-03-13 ENCOUNTER — Ambulatory Visit: Payer: Self-pay | Attending: Family Medicine | Admitting: Internal Medicine

## 2020-03-13 ENCOUNTER — Encounter: Payer: Self-pay | Admitting: Internal Medicine

## 2020-03-13 ENCOUNTER — Other Ambulatory Visit: Payer: Self-pay

## 2020-03-13 DIAGNOSIS — Z23 Encounter for immunization: Secondary | ICD-10-CM

## 2020-03-13 DIAGNOSIS — Z125 Encounter for screening for malignant neoplasm of prostate: Secondary | ICD-10-CM

## 2020-03-13 DIAGNOSIS — F17200 Nicotine dependence, unspecified, uncomplicated: Secondary | ICD-10-CM

## 2020-03-13 DIAGNOSIS — Z114 Encounter for screening for human immunodeficiency virus [HIV]: Secondary | ICD-10-CM | POA: Diagnosis not present

## 2020-03-13 DIAGNOSIS — E785 Hyperlipidemia, unspecified: Secondary | ICD-10-CM

## 2020-03-13 DIAGNOSIS — Z6841 Body Mass Index (BMI) 40.0 and over, adult: Secondary | ICD-10-CM | POA: Insufficient documentation

## 2020-03-13 DIAGNOSIS — G4700 Insomnia, unspecified: Secondary | ICD-10-CM

## 2020-03-13 DIAGNOSIS — Z79899 Other long term (current) drug therapy: Secondary | ICD-10-CM | POA: Insufficient documentation

## 2020-03-13 DIAGNOSIS — E119 Type 2 diabetes mellitus without complications: Secondary | ICD-10-CM

## 2020-03-13 DIAGNOSIS — E1169 Type 2 diabetes mellitus with other specified complication: Secondary | ICD-10-CM

## 2020-03-13 DIAGNOSIS — I10 Essential (primary) hypertension: Secondary | ICD-10-CM

## 2020-03-13 DIAGNOSIS — M19012 Primary osteoarthritis, left shoulder: Secondary | ICD-10-CM

## 2020-03-13 DIAGNOSIS — Z1159 Encounter for screening for other viral diseases: Secondary | ICD-10-CM | POA: Diagnosis not present

## 2020-03-13 DIAGNOSIS — Z7984 Long term (current) use of oral hypoglycemic drugs: Secondary | ICD-10-CM | POA: Insufficient documentation

## 2020-03-13 DIAGNOSIS — F17210 Nicotine dependence, cigarettes, uncomplicated: Secondary | ICD-10-CM | POA: Insufficient documentation

## 2020-03-13 DIAGNOSIS — M19011 Primary osteoarthritis, right shoulder: Secondary | ICD-10-CM

## 2020-03-13 HISTORY — DX: Insomnia, unspecified: G47.00

## 2020-03-13 HISTORY — DX: Encounter for immunization: Z23

## 2020-03-13 LAB — POCT GLYCOSYLATED HEMOGLOBIN (HGB A1C): HbA1c, POC (controlled diabetic range): 7.5 % — AB (ref 0.0–7.0)

## 2020-03-13 LAB — GLUCOSE, POCT (MANUAL RESULT ENTRY): POC Glucose: 235 mg/dl — AB (ref 70–99)

## 2020-03-13 MED ORDER — METFORMIN HCL 500 MG PO TABS: 500.0000 mg | ORAL_TABLET | Freq: Every day | ORAL | 1 refills | Status: DC

## 2020-03-13 MED ORDER — GLIMEPIRIDE 4 MG PO TABS: 4.0000 mg | ORAL_TABLET | Freq: Every day | ORAL | 1 refills | Status: DC

## 2020-03-13 MED ORDER — HYDROCHLOROTHIAZIDE 12.5 MG PO TABS: 12.5000 mg | ORAL_TABLET | Freq: Every day | ORAL | 3 refills | Status: DC

## 2020-03-13 MED ORDER — CELECOXIB 200 MG PO CAPS: 200.0000 mg | ORAL_CAPSULE | Freq: Every day | ORAL | 1 refills | Status: DC

## 2020-03-13 MED ORDER — LISINOPRIL 20 MG PO TABS: 20.0000 mg | ORAL_TABLET | Freq: Every day | ORAL | 1 refills | Status: DC

## 2020-03-13 MED ORDER — AMLODIPINE BESYLATE 10 MG PO TABS: 10.0000 mg | ORAL_TABLET | Freq: Every day | ORAL | 1 refills | Status: DC

## 2020-03-13 MED ORDER — ATORVASTATIN CALCIUM 20 MG PO TABS: 20.0000 mg | ORAL_TABLET | Freq: Every day | ORAL | 1 refills | Status: DC

## 2020-03-13 MED ORDER — TRAZODONE HCL 50 MG PO TABS: 25.0000 mg | ORAL_TABLET | Freq: Every evening | ORAL | 1 refills | Status: DC | PRN

## 2020-03-13 MED FILL — CELECOXIB 200 MG CAP: 200 | 90 days supply | Qty: 90 | Fill #0

## 2020-03-13 MED FILL — METFORMIN HCL 500 MG TABS: 500 | 90 days supply | Qty: 90 | Fill #0

## 2020-03-13 MED FILL — LISINOPRIL 20 MG TABLET: 20 | 90 days supply | Qty: 90 | Fill #0

## 2020-03-13 MED FILL — GLIMEPIRIDE 4 MG TABS: 4 | 90 days supply | Qty: 90 | Fill #0

## 2020-03-13 MED FILL — HYDROCHLOROTHIAZIDE 12.5 MG: 12.5 | 90 days supply | Qty: 90 | Fill #0

## 2020-03-13 MED FILL — traZODone HCL 50 MG TABS: 50 | 90 days supply | Qty: 90 | Fill #0

## 2020-03-13 MED FILL — AMLODIPINE BESYLATE 10 MG T: 10 | 90 days supply | Qty: 90 | Fill #0

## 2020-03-13 MED FILL — ATORVASTATIN CALCIUM 20 MG: 20 | 90 days supply | Qty: 90 | Fill #0

## 2020-03-13 NOTE — Progress Notes (Signed)
Patient ID: Luis Larson, male    DOB: 08/30/1955  MRN: [id number]  CC: Diabetes and Hypertension   Subjective: Luis Larson is a 64 y.o. male who presents for chronic ds management His concerns today include:  Pt with hx of HTN, DM,HL,tob dep,ED, OA knees and shoulders.  Osteoarthritis shoulders and knees: Complains of decrease strength in RT arm.  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  He had torn muscle in the left upper arm several years ago so he compensated by using his right arm more. -Over the past several months he has noted difficulty when he tries to lift anything with the right arm.  He can hold his arm up for a little while but when any resistance is added he feels he loses his strength.  Also feels that his grip on the right side is decreased.  Reports occasional numbness and tingling from the right shoulder to the elbow. -Endorses pain in the shoulders but the right more than the left. Out Celebrex.  Was on tramadol but to be discontinued after he had positive urine drug screen for marijuana.  He continues to use marijuana daily.  Complains of problems sleeping at night partially due to pain in the shoulders.  Usually gets in bed around about the same time every night and turns off all lights and sounds including his cell phone.  He drinks about 5 cups of coffee throughout the day and drinks his last cup around 7:00.  However his last cup of coffee is usually decaffeinated.  Does not drink alcoholic beverages.  He had tried his sisters trazodone one time and found it to be very good.  Wanting to know whether he can be placed on trazodone to take at bedtime.  DIABETES TYPE 2 Last A1C:   Results for orders placed or performed in visit on 03/13/20  POCT glucose (manual entry)  Result Value Ref Range   POC Glucose 235 (A) 70 - 99 mg/dl  POCT glycosylated hemoglobin (Hb A1C)  Result Value Ref Range   Hemoglobin A1C     HbA1c POC (<>  result, manual entry)     HbA1c, POC (prediabetic range)     HbA1c, POC (controlled diabetic range) 7.5 (A) 0.0 - 7.0 %    Med Adherence:  Taking Metformin once a day instead of twice daily.  When he takes it twice a day it causes diarrhea.  Reports compliance with Amaryl.  Blood sugar in the 200s today.  He attributes this to eating an oatmeal cookie for breakfast. Medication side effects:  [x]  Yes-diarrhea with twice daily dosing of Metformin    []  No Home Monitoring?  [x]  Yes  About 3x/mth Home glucose results range: 115-225 Diet Adherence: Not good.  9 lbs gained since January.  Feels bread is a problem.  Drinks 5 cups coffee a day with splenda.  Last one around 7 p.m is decaf.  Also drinks water and Gatorade.  Snacks of Granola bars.  Feels portion sizes okay Exercise: []  Yes    [x]  No  Hypoglycemic episodes?: []  Yes    []  No Numbness of the feet? []  Yes    [x]  No Retinopathy hx? []  Yes    []  No Last eye exam: over due.  No insurance. Comments:    HYPERTENSION Currently taking: see medication list.  On hydrochlorothiazide, amlodipine and lisinopril. Med Adherence: [x]  Yes    []  No Medication side effects: []  Yes    [x]   No Adherence with salt restriction: [x]  Yes    []  No Home Monitoring?: []  Yes    [x]  No Monitoring Frequency: []  Yes    []  No Home BP results range: []  Yes    []  No SOB? []  Yes    [x]  No Chest Pain?: []  Yes    [x]  No Leg swelling?: []  Yes    [x]  No Headaches?: []  Yes    [x]  No Dizziness? []  Yes    [x]  No Comments:   Tob dep:  From 2 pks to 1 pk/day.  Thinking about quitting.  Just purchased 2 boxes patches.  Will start using them in 1 wk.   HL:  taking and tolerating Lipitor Patient Active Problem List   Diagnosis Date Noted  . Influenza vaccine needed 03/13/2020  . Primary osteoarthritis of both shoulders 07/24/2019  . Morbid obesity (HCC) 07/24/2019  . Chronic pain of both shoulders 03/19/2019  . Hyperlipidemia 12/16/2017  . Tobacco dependence 09/16/2017   . Other male erectile dysfunction 10/04/2013  . Colon cancer screening 10/04/2013  . Diabetes (HCC) 12/22/2012  . Essential hypertension 12/22/2012  . Type II or unspecified type diabetes mellitus without mention of complication, uncontrolled 10/17/2012     Current Outpatient Medications on File Prior to Visit  Medication Sig Dispense Refill  . aspirin EC 81 MG tablet Take 1 tablet (81 mg total) by mouth daily. 30 tablet   . glucose blood test strip Use as instructed 100 each 12  . glucose monitoring kit (FREESTYLE) monitoring kit 1 each by Does not apply route as needed for other. 1 each 2  . sildenafil (VIAGRA) 100 MG tablet TAKE 1/2 TO 1 TAB BY MOUTH 1 HOUR PRIOR TO INTERCOURSE AS NEEDED 10 tablet 1   No current facility-administered medications on file prior to visit.    No Known Allergies  Social History   Socioeconomic History  . Marital status: Divorced    Spouse name: Not on file  . Number of children: Not on file  . Years of education: Not on file  . Highest education level: Not on file  Occupational History  . Not on file  Tobacco Use  . Smoking status: Current Every Day Smoker    Packs/day: 2.00    Types: Cigarettes  . Smokeless tobacco: Never Used  Vaping Use  . Vaping Use: Never used  Substance and Sexual Activity  . Alcohol use: No  . Drug use: No  . Sexual activity: Not on file  Other Topics Concern  . Not on file  Social History Narrative  . Not on file   Social Determinants of Health   Financial Resource Strain:   . Difficulty of Paying Living Expenses: Not on file  Food Insecurity:   . Worried About Programme researcher, broadcasting/film/video in the Last Year: Not on file  . Ran Out of Food in the Last Year: Not on file  Transportation Needs:   . Lack of Transportation (Medical): Not on file  . Lack of Transportation (Non-Medical): Not on file  Physical Activity:   . Days of Exercise per Week: Not on file  . Minutes of Exercise per Session: Not on file  Stress:     . Feeling of Stress : Not on file  Social Connections:   . Frequency of Communication with Friends and Family: Not on file  . Frequency of Social Gatherings with Friends and Family: Not on file  . Attends Religious Services: Not on file  . Active Member  of Clubs or Organizations: Not on file  . Attends Banker Meetings: Not on file  . Marital Status: Not on file  Intimate Partner Violence:   . Fear of Current or Ex-Partner: Not on file  . Emotionally Abused: Not on file  . Physically Abused: Not on file  . Sexually Abused: Not on file    Family History  Problem Relation Age of Onset  . Diabetes Mother   . Heart disease Mother   . Heart disease Father     No past surgical history on file.  ROS: Review of Systems Negative except as stated above  PHYSICAL EXAM: BP (!) 154/65   Pulse 60   Temp (!) 96.9 F (36.1 C)   Resp 16   Ht 5\' 11"  (1.803 m)   Wt (!) 317 lb 12.8 oz (144.2 kg)   SpO2 95%   BMI 44.32 kg/m   Wt Readings from Last 3 Encounters:  03/13/20 (!) 317 lb 12.8 oz (144.2 kg)  07/24/19 (!) 314 lb 9.6 oz (142.7 kg)  05/10/19 (!) 308 lb (139.7 kg)    Physical Exam  General appearance - alert, well appearing, and in no distress Mental status - normal mood, behavior, speech, dress, motor activity, and thought processes Mouth - mucous membranes moist, pharynx normal without lesions Neck - supple, no significant adenopathy Chest - clear to auscultation, no wheezes, rales or rhonchi, symmetric air entry Heart - normal rate, regular rhythm, normal S1, S2, no murmurs, rubs, clicks or gallops Extremities - peripheral pulses normal, no pedal edema, no clubbing or cyanosis Neuro-no muscle wasting noted in the hands or the arms.  Grip 5/5 bilaterally.  Power proximally and distally in the upper extremities 5/5.  No wrist drop. MSK: Mild tenderness on palpation of the anterior glenohumeral joint on both sides.  He is able to elevate the right shoulder to  about 70 degrees before he starts experiencing pain. Diabetic Foot Exam - Simple   Simple Foot Form Visual Inspection See comments: Yes Sensation Testing Intact to touch and monofilament testing bilaterally: Yes Pulse Check Posterior Tibialis and Dorsalis pulse intact bilaterally: Yes Comments Nonulcerative corn on the left second toe     CMP Latest Ref Rng & Units 12/11/2018 09/16/2017 09/16/2014  Glucose 65 - 99 mg/dL 74 034(V) 72  BUN 8 - 27 mg/dL 15 12 14   Creatinine 0.76 - 1.27 mg/dL 4.25 9.56 3.87  Sodium 134 - 144 mmol/L 143 140 140  Potassium 3.5 - 5.2 mmol/L 5.0 4.7 4.6  Chloride 96 - 106 mmol/L 101 101 104  CO2 20 - 29 mmol/L 30(H) 25 28  Calcium 8.6 - 10.2 mg/dL 9.2 9.7 9.2  Total Protein 6.0 - 8.5 g/dL 6.9 7.1 6.7  Total Bilirubin 0.0 - 1.2 mg/dL 0.2 0.3 0.4  Alkaline Phos 39 - 117 IU/L 92 105 87  AST 0 - 40 IU/L 16 20 15   ALT 0 - 44 IU/L 17 25 22    Lipid Panel     Component Value Date/Time   CHOL 98 (L) 03/23/2019 1457   TRIG 119 03/23/2019 1457   HDL 39 (L) 03/23/2019 1457   CHOLHDL 2.5 03/23/2019 1457   CHOLHDL 4.1 09/16/2014 1744   VLDL 22 09/16/2014 1744   LDLCALC 37 03/23/2019 1457    CBC    Component Value Date/Time   WBC 8.4 03/23/2019 1457   WBC 7.9 06/06/2013 1156   RBC 4.85 03/23/2019 1457   RBC 5.71 06/06/2013 1156   HGB  14.5 03/23/2019 1457   HCT 43.0 03/23/2019 1457   PLT 204 03/23/2019 1457   MCV 89 03/23/2019 1457   MCH 29.9 03/23/2019 1457   MCH 29.1 06/06/2013 1156   MCHC 33.7 03/23/2019 1457   MCHC 34.6 06/06/2013 1156   RDW 13.5 03/23/2019 1457   LYMPHSABS 2.4 06/06/2013 1156   MONOABS 0.6 06/06/2013 1156   EOSABS 0.2 06/06/2013 1156   BASOSABS 0.0 06/06/2013 1156    ASSESSMENT AND PLAN: 1. Type 2 diabetes mellitus with morbid obesity (HCC) A1c close to goal. Decrease Metformin to 500 mg once a day.  Increase Amaryl to 4 mg daily. Discussed the importance of healthy eating habits, regular aerobic exercise (at least 150  minutes a week as tolerated) and medication compliance to achieve or maintain control of diabetes.  - POCT glucose (manual entry) - POCT glycosylated hemoglobin (Hb A1C) - CBC - Comprehensive metabolic panel - Lipid panel - Microalbumin / creatinine urine ratio - Amb ref to Medical Nutrition Therapy-MNT - glimepiride (AMARYL) 4 MG tablet; Take 1 tablet (4 mg total) by mouth daily before breakfast.  Dispense: 90 tablet; Refill: 1 - metFORMIN (GLUCOPHAGE) 500 MG tablet; Take 1 tablet (500 mg total) by mouth daily with breakfast.  Dispense: 90 tablet; Refill: 1  2. Essential hypertension Not at goal.  Continue amlodipine and lisinopril.  Add low-dose of hydrochlorothiazide. - amLODipine (NORVASC) 10 MG tablet; Take 1 tablet (10 mg total) by mouth daily.  Dispense: 90 tablet; Refill: 1 - lisinopril (ZESTRIL) 20 MG tablet; Take 1 tablet (20 mg total) by mouth daily.  Dispense: 90 tablet; Refill: 1 - hydrochlorothiazide (HYDRODIURIL) 12.5 MG tablet; Take 1 tablet (12.5 mg total) by mouth daily.  Dispense: 90 tablet; Refill: 3  3. Primary osteoarthritis of both shoulders Refill Celebrex. - Ambulatory referral to Orthopedic Surgery - celecoxib (CELEBREX) 200 MG capsule; Take 1 capsule (200 mg total) by mouth daily.  Dispense: 90 capsule; Refill: 1  4. Tobacco dependence Strongly advised to quit.  Discussed health risks associated with smoking.  He plans to start using the nicotine patches to help decrease his cravings and to help him quit.  Less than 5 minutes spent on counseling.  5. Insomnia, unspecified type Good sleep hygiene discussed and encouraged.  Advised to cut back on the amount of coffee that he drinks during the day.  Advised to get in bed around about the same time every night and once in bed turn off all lights and sounds.  If unable to fall asleep within 45 minutes, he should get up and do something until he feels sleepy.  We will give a trial of trazodone - traZODone (DESYREL) 50  MG tablet; Take 0.5-1 tablets (25-50 mg total) by mouth at bedtime as needed for sleep.  Dispense: 90 tablet; Refill: 1  6. Need for immunization against influenza - Flu Vaccine QUAD 36+ mos IM  7. Screening for HIV (human immunodeficiency virus) - HIV Antibody (routine testing w rflx)  8. Need for hepatitis C screening test - Hepatitis C Antibody  9. Hyperlipidemia, unspecified hyperlipidemia type - atorvastatin (LIPITOR) 20 MG tablet; Take 1 tablet (20 mg total) by mouth daily.  Dispense: 90 tablet; Refill: 1  10. Prostate cancer screening Discussed prostate cancer screening.  Patient agreeable to screening. - PSA     Patient was given the opportunity to ask questions.  Patient verbalized understanding of the plan and was able to repeat key elements of the plan.   Orders Placed This Encounter  Procedures  . Flu Vaccine QUAD 36+ mos IM  . CBC  . Comprehensive metabolic panel  . Lipid panel  . HIV Antibody (routine testing w rflx)  . Hepatitis C Antibody  . Microalbumin / creatinine urine ratio  . PSA  . Amb ref to Medical Nutrition Therapy-MNT  . Ambulatory referral to Orthopedic Surgery  . POCT glucose (manual entry)  . POCT glycosylated hemoglobin (Hb A1C)     Requested Prescriptions   Signed Prescriptions Disp Refills  . glimepiride (AMARYL) 4 MG tablet 90 tablet 1    Sig: Take 1 tablet (4 mg total) by mouth daily before breakfast.  . amLODipine (NORVASC) 10 MG tablet 90 tablet 1    Sig: Take 1 tablet (10 mg total) by mouth daily.  Marland Kitchen atorvastatin (LIPITOR) 20 MG tablet 90 tablet 1    Sig: Take 1 tablet (20 mg total) by mouth daily.  Marland Kitchen lisinopril (ZESTRIL) 20 MG tablet 90 tablet 1    Sig: Take 1 tablet (20 mg total) by mouth daily.  . celecoxib (CELEBREX) 200 MG capsule 90 capsule 1    Sig: Take 1 capsule (200 mg total) by mouth daily.  . metFORMIN (GLUCOPHAGE) 500 MG tablet 90 tablet 1    Sig: Take 1 tablet (500 mg total) by mouth daily with breakfast.  .  hydrochlorothiazide (HYDRODIURIL) 12.5 MG tablet 90 tablet 3    Sig: Take 1 tablet (12.5 mg total) by mouth daily.  . traZODone (DESYREL) 50 MG tablet 90 tablet 1    Sig: Take 0.5-1 tablets (25-50 mg total) by mouth at bedtime as needed for sleep.    Return in about 4 months (around 07/12/2020).  Jonah Blue, MD, FACP

## 2020-03-13 NOTE — Patient Instructions (Addendum)
We have increased glimepiride to 4 mg daily for better control of your diabetes. Your blood pressure is not controlled. We have added a medication called hydrochlorothiazide 12.5 mg daily. Trazodone added to help with insomnia.   Diabetes Mellitus and Nutrition, Adult When you have diabetes (diabetes mellitus), it is very important to have healthy eating habits because your blood sugar (glucose) levels are greatly affected by what you eat and drink. Eating healthy foods in the appropriate amounts, at about the same times every day, can help you:  Control your blood glucose.  Lower your risk of heart disease.  Improve your blood pressure.  Reach or maintain a healthy weight. Every person with diabetes is different, and each person has different needs for a meal plan. Your health care provider may recommend that you work with a diet and nutrition specialist (dietitian) to make a meal plan that is best for you. Your meal plan may vary depending on factors such as:  The calories you need.  The medicines you take.  Your weight.  Your blood glucose, blood pressure, and cholesterol levels.  Your activity level.  Other health conditions you have, such as heart or kidney disease. How do carbohydrates affect me? Carbohydrates, also called carbs, affect your blood glucose level more than any other type of food. Eating carbs naturally raises the amount of glucose in your blood. Carb counting is a method for keeping track of how many carbs you eat. Counting carbs is important to keep your blood glucose at a healthy level, especially if you use insulin or take certain oral diabetes medicines. It is important to know how many carbs you can safely have in each meal. This is different for every person. Your dietitian can help you calculate how many carbs you should have at each meal and for each snack. Foods that contain carbs include:  Bread, cereal, rice, pasta, and crackers.  Potatoes and  corn.  Peas, beans, and lentils.  Milk and yogurt.  Fruit and juice.  Desserts, such as cakes, cookies, ice cream, and candy. How does alcohol affect me? Alcohol can cause a sudden decrease in blood glucose (hypoglycemia), especially if you use insulin or take certain oral diabetes medicines. Hypoglycemia can be a life-threatening condition. Symptoms of hypoglycemia (sleepiness, dizziness, and confusion) are similar to symptoms of having too much alcohol. If your health care provider says that alcohol is safe for you, follow these guidelines:  Limit alcohol intake to no more than 1 drink per day for nonpregnant women and 2 drinks per day for men. One drink equals 12 oz of beer, 5 oz of wine, or 1 oz of hard liquor.  Do not drink on an empty stomach.  Keep yourself hydrated with water, diet soda, or unsweetened iced tea.  Keep in mind that regular soda, juice, and other mixers may contain a lot of sugar and must be counted as carbs. What are tips for following this plan?  Reading food labels  Start by checking the serving size on the "Nutrition Facts" label of packaged foods and drinks. The amount of calories, carbs, fats, and other nutrients listed on the label is based on one serving of the item. Many items contain more than one serving per package.  Check the total grams (g) of carbs in one serving. You can calculate the number of servings of carbs in one serving by dividing the total carbs by 15. For example, if a food has 30 g of total carbs, it would  be equal to 2 servings of carbs.  Check the number of grams (g) of saturated and trans fats in one serving. Choose foods that have low or no amount of these fats.  Check the number of milligrams (mg) of salt (sodium) in one serving. Most people should limit total sodium intake to less than 2,300 mg per day.  Always check the nutrition information of foods labeled as "low-fat" or "nonfat". These foods may be higher in added sugar or  refined carbs and should be avoided.  Talk to your dietitian to identify your daily goals for nutrients listed on the label. Shopping  Avoid buying canned, premade, or processed foods. These foods tend to be high in fat, sodium, and added sugar.  Shop around the outside edge of the grocery store. This includes fresh fruits and vegetables, bulk grains, fresh meats, and fresh dairy. Cooking  Use low-heat cooking methods, such as baking, instead of high-heat cooking methods like deep frying.  Cook using healthy oils, such as olive, canola, or sunflower oil.  Avoid cooking with butter, cream, or high-fat meats. Meal planning  Eat meals and snacks regularly, preferably at the same times every day. Avoid going long periods of time without eating.  Eat foods high in fiber, such as fresh fruits, vegetables, beans, and whole grains. Talk to your dietitian about how many servings of carbs you can eat at each meal.  Eat 4-6 ounces (oz) of lean protein each day, such as lean meat, chicken, fish, eggs, or tofu. One oz of lean protein is equal to: ? 1 oz of meat, chicken, or fish. ? 1 egg. ?  cup of tofu.  Eat some foods each day that contain healthy fats, such as avocado, nuts, seeds, and fish. Lifestyle  Check your blood glucose regularly.  Exercise regularly as told by your health care provider. This may include: ? 150 minutes of moderate-intensity or vigorous-intensity exercise each week. This could be brisk walking, biking, or water aerobics. ? Stretching and doing strength exercises, such as yoga or weightlifting, at least 2 times a week.  Take medicines as told by your health care provider.  Do not use any products that contain nicotine or tobacco, such as cigarettes and e-cigarettes. If you need help quitting, ask your health care provider.  Work with a Veterinary surgeon or diabetes educator to identify strategies to manage stress and any emotional and social challenges. Questions to ask a  health care provider  Do I need to meet with a diabetes educator?  Do I need to meet with a dietitian?  What number can I call if I have questions?  When are the best times to check my blood glucose? Where to find more information:  American Diabetes Association: diabetes.org  Academy of Nutrition and Dietetics: www.eatright.[employer] of Diabetes and Digestive and Kidney Diseases (NIH): CarFlippers.tn Summary  A healthy meal plan will help you control your blood glucose and maintain a healthy lifestyle.  Working with a diet and nutrition specialist (dietitian) can help you make a meal plan that is best for you.  Keep in mind that carbohydrates (carbs) and alcohol have immediate effects on your blood glucose levels. It is important to count carbs and to use alcohol carefully. This information is not intended to replace advice given to you by your health care provider. Make sure you discuss any questions you have with your health care provider. Document Revised: 03/11/2017 Document Reviewed: 05/03/2016 Elsevier Patient Education  2020 [employer].  Influenza Virus Vaccine injection (Fluarix) What is this medicine? INFLUENZA VIRUS VACCINE (in floo EN zuh VAHY ruhs vak SEEN) helps to reduce the risk of getting influenza also known as the flu. This medicine may be used for other purposes; ask your health care provider or pharmacist if you have questions. COMMON BRAND NAME(S): Fluarix, Fluzone What should I tell my health care provider before I take this medicine? They need to know if you have any of these conditions:  bleeding disorder like hemophilia  fever or infection  Guillain-Barre syndrome or other neurological problems  immune system problems  infection with the human immunodeficiency virus (HIV) or AIDS  low blood platelet counts  multiple sclerosis  an unusual or allergic reaction to influenza virus vaccine, eggs, chicken proteins, latex,  gentamicin, other medicines, foods, dyes or preservatives  pregnant or trying to get pregnant  breast-feeding How should I use this medicine? This vaccine is for injection into a muscle. It is given by a health care professional. A copy of Vaccine Information Statements will be given before each vaccination. Read this sheet carefully each time. The sheet may change frequently. Talk to your pediatrician regarding the use of this medicine in children. Special care may be needed. Overdosage: If you think you have taken too much of this medicine contact a poison control center or emergency room at once. NOTE: This medicine is only for you. Do not share this medicine with others. What if I miss a dose? This does not apply. What may interact with this medicine?  chemotherapy or radiation therapy  medicines that lower your immune system like etanercept, anakinra, infliximab, and adalimumab  medicines that treat or prevent blood clots like warfarin  phenytoin  steroid medicines like prednisone or cortisone  theophylline  vaccines This list may not describe all possible interactions. Give your health care provider a list of all the medicines, herbs, non-prescription drugs, or dietary supplements you use. Also tell them if you smoke, drink alcohol, or use illegal drugs. Some items may interact with your medicine. What should I watch for while using this medicine? Report any side effects that do not go away within 3 days to your doctor or health care professional. Call your health care provider if any unusual symptoms occur within 6 weeks of receiving this vaccine. You may still catch the flu, but the illness is not usually as bad. You cannot get the flu from the vaccine. The vaccine will not protect against colds or other illnesses that may cause fever. The vaccine is needed every year. What side effects may I notice from receiving this medicine? Side effects that you should report to your  doctor or health care professional as soon as possible:  allergic reactions like skin rash, itching or hives, swelling of the face, lips, or tongue Side effects that usually do not require medical attention (report to your doctor or health care professional if they continue or are bothersome):  fever  headache  muscle aches and pains  pain, tenderness, redness, or swelling at site where injected  weak or tired This list may not describe all possible side effects. Call your doctor for medical advice about side effects. You may report side effects to FDA at 1-800-FDA-1088. Where should I keep my medicine? This vaccine is only given in a clinic, pharmacy, doctor's office, or other health care setting and will not be stored at home. NOTE: This sheet is a summary. It may not cover all possible information. If you have  questions about this medicine, talk to your doctor, pharmacist, or health care provider.  2020 Elsevier/Gold Standard (2007-10-25 09:30:40)

## 2020-03-14 ENCOUNTER — Ambulatory Visit: Payer: Medicaid Other | Admitting: Family Medicine

## 2020-03-14 LAB — CBC
Hematocrit: 47.9 % (ref 37.5–51.0)
Hemoglobin: 16 g/dL (ref 13.0–17.7)
MCH: 29.3 pg (ref 26.6–33.0)
MCHC: 33.4 g/dL (ref 31.5–35.7)
MCV: 88 fL (ref 79–97)
Platelets: 225 10*3/uL (ref 150–450)
RBC: 5.46 x10E6/uL (ref 4.14–5.80)
RDW: 13.2 % (ref 11.6–15.4)
WBC: 7.6 10*3/uL (ref 3.4–10.8)

## 2020-03-14 LAB — MICROALBUMIN / CREATININE URINE RATIO
Creatinine, Urine: 102.7 mg/dL
Microalb/Creat Ratio: 29 mg/g creat (ref 0–29)
Microalbumin, Urine: 30.2 ug/mL

## 2020-03-14 LAB — COMPREHENSIVE METABOLIC PANEL
ALT: 23 IU/L (ref 0–44)
AST: 18 IU/L (ref 0–40)
Albumin/Globulin Ratio: 1.7 (ref 1.2–2.2)
Albumin: 4.5 g/dL (ref 3.8–4.8)
Alkaline Phosphatase: 106 IU/L (ref 44–121)
BUN/Creatinine Ratio: 12 (ref 10–24)
BUN: 12 mg/dL (ref 8–27)
Bilirubin Total: 0.3 mg/dL (ref 0.0–1.2)
CO2: 22 mmol/L (ref 20–29)
Calcium: 9.5 mg/dL (ref 8.6–10.2)
Chloride: 99 mmol/L (ref 96–106)
Creatinine, Ser: 1 mg/dL (ref 0.76–1.27)
GFR calc Af Amer: 93 mL/min/{1.73_m2} (ref >59)
GFR calc non Af Amer: 80 mL/min/{1.73_m2} (ref >59)
Globulin, Total: 2.6 g/dL (ref 1.5–4.5)
Glucose: 148 mg/dL — ABNORMAL HIGH (ref 65–99)
Potassium: 4.8 mmol/L (ref 3.5–5.2)
Sodium: 138 mmol/L (ref 134–144)
Total Protein: 7.1 g/dL (ref 6.0–8.5)

## 2020-03-14 LAB — HIV ANTIBODY (ROUTINE TESTING W REFLEX): HIV Screen 4th Generation wRfx: NONREACTIVE

## 2020-03-14 LAB — HEPATITIS C ANTIBODY: Hep C Virus Ab: <0.1 s/co ratio (ref 0.0–0.9)

## 2020-03-14 LAB — LIPID PANEL
Chol/HDL Ratio: 3.2 ratio (ref 0.0–5.0)
Cholesterol, Total: 116 mg/dL (ref 100–199)
HDL: 36 mg/dL — ABNORMAL LOW (ref >39)
LDL Chol Calc (NIH): 62 mg/dL (ref 0–99)
Triglycerides: 96 mg/dL (ref 0–149)
VLDL Cholesterol Cal: 18 mg/dL (ref 5–40)

## 2020-03-14 LAB — PSA: Prostate Specific Ag, Serum: 1.2 ng/mL (ref 0.0–4.0)

## 2020-03-17 ENCOUNTER — Telehealth: Payer: Self-pay

## 2020-03-17 NOTE — Telephone Encounter (Signed)
Contacted pt to go over lab results pt is aware and doesn't have any questions or concerns 

## 2020-06-16 MED FILL — METFORMIN HCL 500 MG TABS: 500 | 90 days supply | Qty: 90 | Fill #1

## 2020-06-16 MED FILL — HYDROCHLOROTHIAZIDE 12.5 MG: 12.5 | 90 days supply | Qty: 90 | Fill #1

## 2020-06-16 MED FILL — traZODone HCL 50 MG TABS: 50 | 90 days supply | Qty: 90 | Fill #1

## 2020-06-16 MED FILL — ATORVASTATIN CALCIUM 20 MG: 20 | 90 days supply | Qty: 90 | Fill #1

## 2020-06-16 MED FILL — LISINOPRIL 20 MG TABLET: 20 | 90 days supply | Qty: 90 | Fill #1

## 2020-06-16 MED FILL — AMLODIPINE BESYLATE 10 MG T: 10 | 90 days supply | Qty: 90 | Fill #1

## 2020-06-16 MED FILL — GLIMEPIRIDE 4 MG TABS: 4 | 90 days supply | Qty: 90 | Fill #1

## 2020-06-16 MED FILL — CELECOXIB 200 MG CAP: 200 | 90 days supply | Qty: 90 | Fill #1

## 2020-08-26 ENCOUNTER — Ambulatory Visit: Payer: Self-pay | Attending: Internal Medicine | Admitting: Internal Medicine

## 2020-08-26 ENCOUNTER — Other Ambulatory Visit: Payer: Self-pay

## 2020-08-26 ENCOUNTER — Encounter: Payer: Self-pay | Admitting: Internal Medicine

## 2020-08-26 VITALS — BP 160/70 | HR 60 | Resp 16 | Wt 326.6 lb

## 2020-08-26 DIAGNOSIS — Z87891 Personal history of nicotine dependence: Secondary | ICD-10-CM

## 2020-08-26 DIAGNOSIS — H60503 Unspecified acute noninfective otitis externa, bilateral: Secondary | ICD-10-CM

## 2020-08-26 DIAGNOSIS — I10 Essential (primary) hypertension: Secondary | ICD-10-CM

## 2020-08-26 HISTORY — DX: Personal history of nicotine dependence: Z87.891

## 2020-08-26 MED ORDER — CIPRO HC 0.2-1 % OT SUSP
3.0000 [drp] | Freq: Two times a day (BID) | OTIC | 0 refills | Status: DC
  Filled 2020-08-26: qty 10, 5d supply, fill #0

## 2020-08-26 MED ORDER — HYDROCHLOROTHIAZIDE 25 MG PO TABS
25.0000 mg | ORAL_TABLET | Freq: Every day | ORAL | 3 refills | Status: DC
  Filled 2020-08-26: qty 30, 30d supply, fill #0
  Filled 2020-10-14: qty 30, 30d supply, fill #1
  Filled 2020-11-12: qty 90, 90d supply, fill #2

## 2020-08-26 MED ORDER — AMOXICILLIN-POT CLAVULANATE 875-125 MG PO TABS
1.0000 | ORAL_TABLET | Freq: Two times a day (BID) | ORAL | 0 refills | Status: DC
  Filled 2020-08-26: qty 10, 5d supply, fill #0

## 2020-08-26 NOTE — Progress Notes (Signed)
Pt states every time he walks his ear hurts  Pt states he had an ear infection awhile ago

## 2020-08-26 NOTE — Progress Notes (Signed)
Patient ID: Luis Larson, male    DOB: [birth year]  MRN: [id number]  CC: Ear Pain (Left )   Subjective: Luis Larson is a 65 y.o. male who presents for UC visit His concerns today include:  Pt with hx of HTN, DM,HL,tob dep,ED, underdeveloped pectoralis muscle right side, OA knees and shoulders.  Patient presents today for urgent care visit.  He complains of pain and decreased hearing in the left ear times several days.  No drainage from the ear.  When he walks he feels as though the ear is vibrating.  He uses Q-tips to clean the ear.  He used to use a Bobby pin to clean the ear but he has not done that in a while.  About 2 weeks ago same thing happened to the right ear.  His friend had some leftover penicillin which he took with resolution.  Blood pressure noted to be elevated today.  He reports compliance with medications and took them already for the morning.  He is on amlodipine, lisinopril and hydrochlorothiazide.  He limits salt in the foods.  No chest pains or shortness of breath.  Tobacco dependence: He reports quitting smoking since January of this year.  Patient Active Problem List   Diagnosis Date Noted  . Influenza vaccine needed 03/13/2020  . Insomnia 03/13/2020  . Primary osteoarthritis of both shoulders 07/24/2019  . Morbid obesity (HCC) 07/24/2019  . Chronic pain of both shoulders 03/19/2019  . Hyperlipidemia 12/16/2017  . Tobacco dependence 09/16/2017  . Other male erectile dysfunction 10/04/2013  . Colon cancer screening 10/04/2013  . Diabetes (HCC) 12/22/2012  . Essential hypertension 12/22/2012  . Type II or unspecified type diabetes mellitus without mention of complication, uncontrolled 10/17/2012     Current Outpatient Medications on File Prior to Visit  Medication Sig Dispense Refill  . amLODipine (NORVASC) 10 MG tablet TAKE 1 TABLET (10 MG TOTAL) BY MOUTH DAILY. 90 tablet 1  . aspirin EC 81 MG tablet Take 1 tablet (81 mg total) by mouth daily. 30 tablet    . atorvastatin (LIPITOR) 20 MG tablet TAKE 1 TABLET (20 MG TOTAL) BY MOUTH DAILY. 90 tablet 1  . celecoxib (CELEBREX) 200 MG capsule TAKE 1 CAPSULE (200 MG TOTAL) BY MOUTH DAILY. 90 capsule 1  . glimepiride (AMARYL) 4 MG tablet TAKE 1 TABLET (4 MG TOTAL) BY MOUTH DAILY BEFORE BREAKFAST. 90 tablet 1  . glucose blood test strip Use as instructed 100 each 12  . glucose monitoring kit (FREESTYLE) monitoring kit 1 each by Does not apply route as needed for other. 1 each 2  . lisinopril (ZESTRIL) 20 MG tablet TAKE 1 TABLET (20 MG TOTAL) BY MOUTH DAILY. 90 tablet 1  . metFORMIN (GLUCOPHAGE) 500 MG tablet TAKE 1 TABLET (500 MG TOTAL) BY MOUTH DAILY WITH BREAKFAST. 90 tablet 1  . sildenafil (VIAGRA) 100 MG tablet TAKE 1/2 TO 1 TAB BY MOUTH 1 HOUR PRIOR TO INTERCOURSE AS NEEDED 10 tablet 1  . traZODone (DESYREL) 50 MG tablet TAKE 0.5-1 TABLETS (25-50 MG TOTAL) BY MOUTH AT BEDTIME AS NEEDED FOR SLEEP. 90 tablet 1   No current facility-administered medications on file prior to visit.    No Known Allergies  Social History   Socioeconomic History  . Marital status: Divorced    Spouse name: Not on file  . Number of children: Not on file  . Years of education: Not on file  . Highest education level: Not on file  Occupational History  . Not  on file  Tobacco Use  . Smoking status: Former Smoker    Packs/day: 2.00    Quit date: 04/2020    Years since quitting: 0.3  . Smokeless tobacco: Never Used  Vaping Use  . Vaping Use: Never used  Substance and Sexual Activity  . Alcohol use: No  . Drug use: No  . Sexual activity: Not on file  Other Topics Concern  . Not on file  Social History Narrative  . Not on file   Social Determinants of Health   Financial Resource Strain: Not on file  Food Insecurity: Not on file  Transportation Needs: Not on file  Physical Activity: Not on file  Stress: Not on file  Social Connections: Not on file  Intimate Partner Violence: Not on file    Family  History  Problem Relation Age of Onset  . Diabetes Mother   . Heart disease Mother   . Heart disease Father     No past surgical history on file.  ROS: Review of Systems Negative except as stated above  PHYSICAL EXAM: BP (!) 160/70   Pulse 60   Resp 16   Wt (!) 326 lb 9.6 oz (148.1 kg)   SpO2 95%   BMI 45.55 kg/m   Physical Exam General appearance - alert, well appearing, and in no distress Mental status - normal mood, behavior, speech, dress, motor activity, and thought processes Ears -left: Preauricular lymphadenopathy.  Mild tenderness on pulling on the pinna.  Ear canal is noted to have whitish exudates and appears edematous.  Tympanic membrane not well visualized.  Right ear: No preauricular lymphadenopathy.  Small amount of whitish exudates noted along the ear canal.  Tympanic membrane not well seen.   Chest - clear to auscultation, no wheezes, rales or rhonchi, symmetric air entry Heart - normal rate, regular rhythm, normal S1, S2, no murmurs, rubs, clicks or gallops Extremities -trace bilateral lower extremity edema.  Positive varicosities in both lower legs.  CMP Latest Ref Rng & Units 03/13/2020 12/11/2018 09/16/2017  Glucose 65 - 99 mg/dL 323(F) 74 573(U)  BUN 8 - 27 mg/dL 12 15 12   Creatinine 0.76 - 1.27 mg/dL 2.02 5.42 7.06  Sodium 134 - 144 mmol/L 138 143 140  Potassium 3.5 - 5.2 mmol/L 4.8 5.0 4.7  Chloride 96 - 106 mmol/L 99 101 101  CO2 20 - 29 mmol/L 22 30(H) 25  Calcium 8.6 - 10.2 mg/dL 9.5 9.2 9.7  Total Protein 6.0 - 8.5 g/dL 7.1 6.9 7.1  Total Bilirubin 0.0 - 1.2 mg/dL 0.3 0.2 0.3  Alkaline Phos 44 - 121 IU/L 106 92 105  AST 0 - 40 IU/L 18 16 20   ALT 0 - 44 IU/L 23 17 25    Lipid Panel     Component Value Date/Time   CHOL 116 03/13/2020 1105   TRIG 96 03/13/2020 1105   HDL 36 (L) 03/13/2020 1105   CHOLHDL 3.2 03/13/2020 1105   CHOLHDL 4.1 09/16/2014 1744   VLDL 22 09/16/2014 1744   LDLCALC 62 03/13/2020 1105    CBC    Component Value  Date/Time   WBC 7.6 03/13/2020 1105   WBC 7.9 06/06/2013 1156   RBC 5.46 03/13/2020 1105   RBC 5.71 06/06/2013 1156   HGB 16.0 03/13/2020 1105   HCT 47.9 03/13/2020 1105   PLT 225 03/13/2020 1105   MCV 88 03/13/2020 1105   MCH 29.3 03/13/2020 1105   MCH 29.1 06/06/2013 1156   MCHC 33.4 03/13/2020 1105  MCHC 34.6 06/06/2013 1156   RDW 13.2 03/13/2020 1105   LYMPHSABS 2.4 06/06/2013 1156   MONOABS 0.6 06/06/2013 1156   EOSABS 0.2 06/06/2013 1156   BASOSABS 0.0 06/06/2013 1156    ASSESSMENT AND PLAN: 1. Acute otitis externa of both ears, unspecified type -Infection in the right ear does not seem to have completely resolved.  Definitely has infection in the left ear.  We will placed him on Cipro otic eardrop to use in both ears x5 days.  Also placed on Augmentin antibiotic for 5 days. - ciprofloxacin-hydrocortisone (CIPRO HC) OTIC suspension; Place 3 drops into both ears 2 (two) times daily. Use for 5 days  Dispense: 10 mL; Refill: 0 - amoxicillin-clavulanate (AUGMENTIN) 875-125 MG tablet; Take 1 tablet by mouth 2 (two) times daily.  Dispense: 10 tablet; Refill: 0  2. Essential hypertension Not at goal.  Increase hydrochlorothiazide to 25 mg daily.  Follow-up with me in 1 month.  After being on the increased dose for 1 week, patient advised to return to the lab to have blood test done to check his electrolytes. - hydrochlorothiazide (HYDRODIURIL) 25 MG tablet; Take 1 tablet (25 mg total) by mouth daily.  Dispense: 90 tablet; Refill: 3 - Basic Metabolic Panel; Future  3. Former smoker Mended him on quitting.  Encouraged him to remain tobacco free.  Patient was given the opportunity to ask questions.  Patient verbalized understanding of the plan and was able to repeat key elements of the plan.   Orders Placed This Encounter  Procedures  . Basic Metabolic Panel     Requested Prescriptions   Signed Prescriptions Disp Refills  . hydrochlorothiazide (HYDRODIURIL) 25 MG tablet 90  tablet 3    Sig: Take 1 tablet (25 mg total) by mouth daily.  . ciprofloxacin-hydrocortisone (CIPRO HC) OTIC suspension 10 mL 0    Sig: Place 3 drops into both ears 2 (two) times daily. Use for 5 days  . amoxicillin-clavulanate (AUGMENTIN) 875-125 MG tablet 10 tablet 0    Sig: Take 1 tablet by mouth 2 (two) times daily.    Return in about 4 weeks (around 09/23/2020) for chronic ds management.  Jonah Blue, MD, FACP

## 2020-08-26 NOTE — Patient Instructions (Addendum)
Increase the hydrochlorothiazide to 25 mg daily.  After you have been on the increased dose for 1 week, please return to the laboratory to have blood test done to check your electrolytes including the potassium level.  Please stop at the front desk to schedule your follow-up appointment.   Otitis Externa  Otitis externa is an infection of the outer ear canal. The outer ear canal is the area between the outside of the ear and the eardrum. Otitis externa is sometimes called swimmer's ear. What are the causes? Common causes of this condition include:  Swimming in dirty water.  Moisture in the ear.  An injury to the inside of the ear.  An object stuck in the ear.  A cut or scrape on the outside of the ear. What increases the risk? You are more likely to get this condition if you go swimming often. What are the signs or symptoms?  Itching in the ear. This is often the first symptom.  Swelling of the ear.  Redness in the ear.  Ear pain. The pain may get worse when you pull on your ear.  Pus coming from the ear. How is this treated? This condition may be treated with:  Antibiotic ear drops. These are often given for 10-14 days.  Medicines to reduce itching and swelling. Follow these instructions at home:  If you were given antibiotic ear drops, use them as told by your doctor. Do not stop using them even if your condition gets better.  Take over-the-counter and prescription medicines only as told by your doctor.  Avoid getting water in your ears as told by your doctor. You may be told to avoid swimming or water sports for a few days.  Keep all follow-up visits as told by your doctor. This is important. How is this prevented?  Keep your ears dry. Use the corner of a towel to dry your ears after you swim or bathe.  Try not to scratch or put things in your ear. Doing these things makes it easier for germs to grow in your ear.  Avoid swimming in lakes, dirty water, or pools  that may not have the right amount of a chemical called chlorine. Contact a doctor if:  You have a fever.  Your ear is still red, swollen, or painful after 3 days.  You still have pus coming from your ear after 3 days.  Your redness, swelling, or pain gets worse.  You have a really bad headache.  You have redness, swelling, pain, or tenderness behind your ear. Summary  Otitis externa is an infection of the outer ear canal.  Symptoms include pain, redness, and swelling of the ear.  If you were given antibiotic ear drops, use them as told by your doctor. Do not stop using them even if your condition gets better.  Try not to scratch or put things in your ear. This information is not intended to replace advice given to you by your health care provider. Make sure you discuss any questions you have with your health care provider. Document Revised: 09/02/2017 Document Reviewed: 09/02/2017 Elsevier Patient Education  2021 [employer].

## 2020-09-11 ENCOUNTER — Ambulatory Visit: Payer: Self-pay | Admitting: *Deleted

## 2020-09-11 NOTE — Telephone Encounter (Signed)
Pt reports tested positive covid yesterday, exposed by neighbor 2 days ago. Reports dry cough, sore throat, mild SOB with coughing only, nasal congestion. States afebrile. Cough moderate to severe. Reviewed self isolation guidelines and home care per protocol. Also reviewed symptoms that warrant an ED eval. Pt is requesting one of the  anti-viral oral meds. All symptoms started yesterday.  Please advise: [phone]  Reason for Disposition . [1] [id number] diagnosed by positive lab test (e.g., PCR, rapid self-test kit) AND [2] NO symptoms (e.g., cough, fever, others)  Answer Assessment - Initial Assessment Questions 1. COVID-19 DIAGNOSIS: "Who made your COVID-19 diagnosis?" "Was it confirmed by a positive lab test or self-test?" If not diagnosed by a doctor (or NP/PA), ask "Are there lots of cases (community spread) where you live?" Note: See public health department website, if unsure.     This am home test 2. COVID-19 EXPOSURE: "Was there any known exposure to COVID before the symptoms began?" CDC Definition of close contact: within 6 feet (2 meters) for a total of 15 minutes or more over a 24-hour period.      Yes, neighbor 2 days ago 3. ONSET: "When did the COVID-19 symptoms start?"      Yesterday 4. WORST SYMPTOM: "What is your worst symptom?" (e.g., cough, fever, shortness of breath, muscle aches)     SOB with coughing only 5. COUGH: "Do you have a cough?" If Yes, ask: "How bad is the cough?"       Dry cough 6. FEVER: "Do you have a fever?" If Yes, ask: "What is your temperature, how was it measured, and when did it start?"     No 7. RESPIRATORY STATUS: "Describe your breathing?" (e.g., shortness of breath, wheezing, unable to speak)      "Feels like it's restricted in my throat." 8. BETTER-SAME-WORSE: "Are you getting better, staying the same or getting worse compared to yesterday?"  If getting worse, ask, "In what way?"      9. HIGH RISK DISEASE: "Do you have any chronic medical  problems?" (e.g., asthma, heart or lung disease, weak immune system, obesity, etc.)      10. VACCINE: "Have you had the COVID-19 vaccine?" If Yes, ask: "Which one, how many shots, when did you get it?"       Yes April or May last year 11. BOOSTER: "Have you received your COVID-19 booster?" If Yes, ask: "Which one and when did you get it?"       1 booster, November. 13. OTHER SYMPTOMS: "Do you have any other symptoms?"  (e.g., chills, fatigue, headache, loss of smell or taste, muscle pain, sore throat)       Sore throat, congestion  Protocols used: CORONAVIRUS (COVID-19) DIAGNOSED OR SUSPECTED-A-AH

## 2020-09-12 ENCOUNTER — Other Ambulatory Visit: Payer: Self-pay

## 2020-09-12 MED ORDER — NIRMATRELVIR/RITONAVIR (PAXLOVID)TABLET
3.0000 | ORAL_TABLET | Freq: Two times a day (BID) | ORAL | 0 refills | Status: AC
  Filled 2020-09-12: qty 30, 5d supply, fill #0

## 2020-09-12 NOTE — Addendum Note (Signed)
Addended by: Jonah Blue B on: 09/12/2020 03:54 PM   Modules accepted: Orders

## 2020-09-12 NOTE — Telephone Encounter (Signed)
Contacted pt to go over provider message pt didn't answer lvm asking pt to give me a call back

## 2020-09-12 NOTE — Telephone Encounter (Signed)
Pt is scheduled a virtual appt with Tonya on 6/6 for +COVID. Pt is requesting a rx for anti-viral med. Will forward to provider

## 2020-09-15 ENCOUNTER — Telehealth (INDEPENDENT_AMBULATORY_CARE_PROVIDER_SITE_OTHER): Payer: Self-pay | Admitting: Nurse Practitioner

## 2020-09-15 ENCOUNTER — Other Ambulatory Visit: Payer: Self-pay

## 2020-09-15 DIAGNOSIS — I10 Essential (primary) hypertension: Secondary | ICD-10-CM

## 2020-09-15 DIAGNOSIS — E785 Hyperlipidemia, unspecified: Secondary | ICD-10-CM

## 2020-09-15 DIAGNOSIS — M19012 Primary osteoarthritis, left shoulder: Secondary | ICD-10-CM

## 2020-09-15 DIAGNOSIS — M19011 Primary osteoarthritis, right shoulder: Secondary | ICD-10-CM

## 2020-09-15 DIAGNOSIS — E1169 Type 2 diabetes mellitus with other specified complication: Secondary | ICD-10-CM

## 2020-09-15 MED ORDER — AMLODIPINE BESYLATE 10 MG PO TABS
10.0000 mg | ORAL_TABLET | Freq: Every day | ORAL | 0 refills | Status: DC
  Filled 2020-09-15: qty 30, 30d supply, fill #0

## 2020-09-15 MED ORDER — GLIMEPIRIDE 4 MG PO TABS
4.0000 mg | ORAL_TABLET | Freq: Every day | ORAL | 0 refills | Status: DC
  Filled 2020-09-15: qty 30, 30d supply, fill #0

## 2020-09-15 MED ORDER — LISINOPRIL 20 MG PO TABS
20.0000 mg | ORAL_TABLET | Freq: Every day | ORAL | 0 refills | Status: DC
  Filled 2020-09-15: qty 30, 30d supply, fill #0

## 2020-09-15 MED ORDER — ATORVASTATIN CALCIUM 20 MG PO TABS
20.0000 mg | ORAL_TABLET | Freq: Every day | ORAL | 0 refills | Status: DC
  Filled 2020-09-15: qty 30, 30d supply, fill #0

## 2020-09-15 MED ORDER — METFORMIN HCL 500 MG PO TABS
500.0000 mg | ORAL_TABLET | Freq: Every day | ORAL | 0 refills | Status: DC
  Filled 2020-09-15: qty 30, 30d supply, fill #0

## 2020-09-15 MED ORDER — CELECOXIB 200 MG PO CAPS
200.0000 mg | ORAL_CAPSULE | Freq: Every day | ORAL | 0 refills | Status: DC
  Filled 2020-09-15: qty 30, 30d supply, fill #0

## 2020-09-15 NOTE — Progress Notes (Signed)
Virtual Visit via Telephone Note  I connected with Renata Caprice on 09/15/20 at  2:00 PM EDT by telephone and verified that I am speaking with the correct person using two identifiers.  Location: Patient: home Provider: office   I discussed the limitations, risks, security and privacy concerns of performing an evaluation and management service by telephone and the availability of in person appointments. I also discussed with the patient that there may be a patient responsible charge related to this service. The patient expressed understanding and agreed to proceed.   History of Present Illness:  Patient presents today for post-COVID care clinic visit through televisit.  Patient states that his since symptom onset was 09/10/2020.  He did have a positive home test on 09/10/2020.  Patient has been prescribed Paxil COVID by his PCP.  Patient states that overall he is stable.  He has had loss of taste and smell and nausea and diarrhea.  He is currently taking Mucinex for sinus congestion as well.  Overall symptoms are improving.  He denies any significant fever or any significant shortness of breath.  Patient is concerned because his current maintenance medications are up for refill and he is due for a 74-month follow-up visit with labs but cannot go to the office due to active COVID.  I did consult with [facility] and we will refill his maintenance meds x1 month so that he can be scheduled for his 10-month follow-up with labs.  I did request for Franky Macho with [facility] pharmacy to contact patient if there are any drug interactions that need to be addressed with current Paxlovid prescription. Denies f/c/s, n/v/d, hemoptysis, PND, chest pain or edema.     Observations/Objective:  Vitals with BMI 08/26/2020 08/26/2020 03/13/2020  Height - - -  Weight - 326 lbs 10 oz -  BMI - - -  Systolic 160 160 867  Diastolic 70 79 65  Pulse - 60 -      Assessment and Plan:  Covid  19:   Stay well hydrated  Stay active  Deep breathing exercises  May take tylenol for fever or pain  May take mucinex DM twice daily  Continue Paxlovid as prescribed by PCP  Requested pharmacy consult for possible drug interactions with paxlovid   Medication Refills:  Note: contacted pt's PCP office regarding need for maintenance medication refills. Patient is due for 6 month follow up, but cannot attend due to active COVID. Will refill maintenance meds for 1 month supply. [facility] will schedule 1 month follow up with Dr. Laural Benes and will need repeat labs.   Follow up:  Follow up as scheduled through [facility]     I discussed the assessment and treatment plan with the patient. The patient was provided an opportunity to ask questions and all were answered. The patient agreed with the plan and demonstrated an understanding of the instructions.   The patient was advised to call back or seek an in-person evaluation if the symptoms worsen or if the condition fails to improve as anticipated.  I provided 23 minutes of non-face-to-face time during this encounter.   Ivonne Andrew, NP

## 2020-09-15 NOTE — Telephone Encounter (Signed)
Thank you :)

## 2020-09-15 NOTE — Patient Instructions (Addendum)
Covid 19:   Stay well hydrated  Stay active  Deep breathing exercises  May take tylenol for fever or pain  May take mucinex DM twice daily  Continue Paxlovid as prescribed by PCP  Requested pharmacy consult for possible drug interactions with paxlovid   Medication Refills:  Note: contacted pt's PCP office regarding need for maintenance medication refills. Patient is due for 6 month follow up, but cannot attend due to active COVID. Will refill maintenance meds for 1 month supply. [facility] will schedule 1 month follow up with Dr. Laural Benes and will need repeat labs.   Follow up:  Follow up as scheduled through [facility]

## 2020-09-19 ENCOUNTER — Other Ambulatory Visit: Payer: Self-pay

## 2020-09-29 ENCOUNTER — Ambulatory Visit: Payer: Self-pay | Attending: Internal Medicine

## 2020-09-29 ENCOUNTER — Other Ambulatory Visit: Payer: Self-pay

## 2020-09-29 DIAGNOSIS — I10 Essential (primary) hypertension: Secondary | ICD-10-CM

## 2020-09-30 LAB — BASIC METABOLIC PANEL
BUN/Creatinine Ratio: 18 (ref 10–24)
BUN: 20 mg/dL (ref 8–27)
CO2: 23 mmol/L (ref 20–29)
Calcium: 9.4 mg/dL (ref 8.6–10.2)
Chloride: 97 mmol/L (ref 96–106)
Creatinine, Ser: 1.13 mg/dL (ref 0.76–1.27)
Glucose: 304 mg/dL — ABNORMAL HIGH (ref 65–99)
Potassium: 4.5 mmol/L (ref 3.5–5.2)
Sodium: 137 mmol/L (ref 134–144)
eGFR: 73 mL/min/{1.73_m2} (ref >59)

## 2020-10-09 ENCOUNTER — Other Ambulatory Visit: Payer: Self-pay

## 2020-10-09 ENCOUNTER — Encounter: Payer: Self-pay | Admitting: Internal Medicine

## 2020-10-09 ENCOUNTER — Ambulatory Visit: Payer: Self-pay | Attending: Internal Medicine | Admitting: Internal Medicine

## 2020-10-09 ENCOUNTER — Ambulatory Visit (HOSPITAL_BASED_OUTPATIENT_CLINIC_OR_DEPARTMENT_OTHER): Payer: Self-pay | Admitting: Pharmacist

## 2020-10-09 DIAGNOSIS — Z79899 Other long term (current) drug therapy: Secondary | ICD-10-CM

## 2020-10-09 DIAGNOSIS — E1169 Type 2 diabetes mellitus with other specified complication: Secondary | ICD-10-CM

## 2020-10-09 DIAGNOSIS — R0609 Other forms of dyspnea: Secondary | ICD-10-CM

## 2020-10-09 DIAGNOSIS — I739 Peripheral vascular disease, unspecified: Secondary | ICD-10-CM

## 2020-10-09 DIAGNOSIS — I10 Essential (primary) hypertension: Secondary | ICD-10-CM

## 2020-10-09 DIAGNOSIS — R0600 Dyspnea, unspecified: Secondary | ICD-10-CM

## 2020-10-09 LAB — POCT GLYCOSYLATED HEMOGLOBIN (HGB A1C): HbA1c, POC (controlled diabetic range): 9 % — AB (ref 0.0–7.0)

## 2020-10-09 LAB — GLUCOSE, POCT (MANUAL RESULT ENTRY): POC Glucose: 258 mg/dl — AB (ref 70–99)

## 2020-10-09 MED ORDER — TRULICITY 0.75 MG/0.5ML ~~LOC~~ SOAJ
0.7500 mg | SUBCUTANEOUS | 5 refills | Status: DC
  Filled 2020-10-09: qty 2, 28d supply, fill #0

## 2020-10-09 NOTE — Progress Notes (Signed)
Patient ID: Luis Larson, male    DOB: 09/01/1955  MRN: [id number]  CC: Diabetes and Hypertension   Subjective: Luis Larson is a 65 y.o. male who presents for chronic ds management His concerns today include:  Pt with hx of HTN, DM, HL, former tob dep (quit 04/2020), ED, underdeveloped pectoralis muscle right side, OA knees and shoulders.   DM: Results for orders placed or performed in visit on 10/09/20  POCT glucose (manual entry)  Result Value Ref Range   POC Glucose 258 (A) 70 - 99 mg/dl  POCT glycosylated hemoglobin (Hb A1C)  Result Value Ref Range   Hemoglobin A1C     HbA1c POC (<> result, manual entry)     HbA1c, POC (prediabetic range)     HbA1c, POC (controlled diabetic range) 9.0 (A) 0.0 - 7.0 %  Checks BS 2-3x/wk.  Range 130-140 Metformin decreased to once a day on last visit but he has still been experiencing nausea and diarrhea when he takes it.  Taking Amaryl daily.   Reports some worsening dyspnea on exertion over the past 6 months.  No lower extremity edema.  He has not had any chest pains..   Eating large portions but only eats 2 x a day.  Trying to cut back on bread.  Drinks diet tea and water.   Due for eye exam.  He is uninsured  HTN:  compliant with medications including HCTZ, amlodipine and lisinopril. 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  He reports significant dyspnea on exertion to the extent where he has to stop intermittently.  Reports some worsening dyspnea on exertion over the past 6 months.  No lower extremity edema.  He has not had any chest pains.. No LE edema. He also complains of pain in both calfs with walking about a block x8 months.  He has to stop and rest before continuing.    Tob:  he has remained free of cigarettes.   Patient Active Problem List   Diagnosis Date Noted   Former smoker 08/26/2020   Influenza vaccine needed 03/13/2020   Insomnia 03/13/2020   Primary osteoarthritis  of both shoulders 07/24/2019   Morbid obesity (HCC) 07/24/2019   Chronic pain of both shoulders 03/19/2019   Hyperlipidemia 12/16/2017   Other male erectile dysfunction 10/04/2013   Colon cancer screening 10/04/2013   Diabetes (HCC) 12/22/2012   Essential hypertension 12/22/2012   Type II or unspecified type diabetes mellitus without mention of complication, uncontrolled 10/17/2012     Current Outpatient Medications on File Prior to Visit  Medication Sig Dispense Refill   amLODipine (NORVASC) 10 MG tablet Take 1 tablet (10 mg total) by mouth daily. 30 tablet 0   aspirin EC 81 MG tablet Take 1 tablet (81 mg total) by mouth daily. 30 tablet    atorvastatin (LIPITOR) 20 MG tablet Take 1 tablet (20 mg total) by mouth daily. 30 tablet 0   celecoxib (CELEBREX) 200 MG capsule Take 1 capsule (200 mg total) by mouth daily. 30 capsule 0   glimepiride (AMARYL) 4 MG tablet Take 1 tablet (4 mg total) by mouth daily with breakfast. 30 tablet 0   glucose blood test strip Use as instructed 100 each 12   glucose monitoring kit (FREESTYLE) monitoring kit 1 each by Does not apply route as needed for other. 1 each 2   hydrochlorothiazide (HYDRODIURIL) 25 MG tablet Take 1 tablet (25 mg total) by mouth daily.  90 tablet 3   lisinopril (ZESTRIL) 20 MG tablet Take 1 tablet (20 mg total) by mouth daily. 30 tablet 0   sildenafil (VIAGRA) 100 MG tablet TAKE 1/2 TO 1 TAB BY MOUTH 1 HOUR PRIOR TO INTERCOURSE AS NEEDED 10 tablet 1   traZODone (DESYREL) 50 MG tablet TAKE 0.5-1 TABLETS (25-50 MG TOTAL) BY MOUTH AT BEDTIME AS NEEDED FOR SLEEP. 90 tablet 1   No current facility-administered medications on file prior to visit.    No Known Allergies  Social History   Socioeconomic History   Marital status: Divorced    Spouse name: Not on file   Number of children: Not on file   Years of education: Not on file   Highest education level: Not on file  Occupational History   Not on file  Tobacco Use   Smoking  status: Former    Packs/day: 2.00    Pack years: 0.00    Types: Cigarettes    Quit date: 04/2020    Years since quitting: 0.4   Smokeless tobacco: Never  Vaping Use   Vaping Use: Never used  Substance and Sexual Activity   Alcohol use: No   Drug use: No   Sexual activity: Not on file  Other Topics Concern   Not on file  Social History Narrative   Not on file   Social Determinants of Health   Financial Resource Strain: Not on file  Food Insecurity: Not on file  Transportation Needs: Not on file  Physical Activity: Not on file  Stress: Not on file  Social Connections: Not on file  Intimate Partner Violence: Not on file    Family History  Problem Relation Age of Onset   Diabetes Mother    Heart disease Mother    Heart disease Father     No past surgical history on file.  ROS: Review of Systems Negative except as stated above  PHYSICAL EXAM: BP 116/73   Pulse 74   Resp 16   Wt (!) 321 lb 12.8 oz (146 kg)   SpO2 93%   BMI 44.88 kg/m   Wt Readings from Last 3 Encounters:  10/09/20 (!) 321 lb 12.8 oz (146 kg)  08/26/20 (!) 326 lb 9.6 oz (148.1 kg)  03/13/20 (!) 317 lb 12.8 oz (144.2 kg)    Physical Exam   General appearance - alert, well appearing, morbidly obese older Caucasian male and in no distress Mental status - normal mood, behavior, speech, dress, motor activity, and thought processes Neck - supple, no significant adenopathy Chest - clear to auscultation, no wheezes, rales or rhonchi, symmetric air entry Heart - normal rate, regular rhythm, normal S1, S2, no murmurs, rubs, clicks or gallops Extremities -no lower extremity edema.  He has significant varicose veins in both legs and feet.  Dorsalis pedis, posterior tibialis and popliteal pulses are decreased bilaterally.  Legs are warm.  No cyanosis noted. Diabetic Foot Exam - Simple   Simple Foot Form Visual Inspection No deformities, no ulcerations, no other skin breakdown bilaterally:  Yes Sensation Testing Intact to touch and monofilament testing bilaterally: Yes Pulse Check Posterior Tibialis and Dorsalis pulse intact bilaterally: Yes Comments      CMP Latest Ref Rng & Units 09/29/2020 03/13/2020 12/11/2018  Glucose 65 - 99 mg/dL 045(W) 098(J) 74  BUN 8 - 27 mg/dL 20 12 15   Creatinine 0.76 - 1.27 mg/dL 1.91 4.78 2.95  Sodium 134 - 144 mmol/L 137 138 143  Potassium 3.5 - 5.2 mmol/L 4.5  4.8 5.0  Chloride 96 - 106 mmol/L 97 99 101  CO2 20 - 29 mmol/L 23 22 30(H)  Calcium 8.6 - 10.2 mg/dL 9.4 9.5 9.2  Total Protein 6.0 - 8.5 g/dL - 7.1 6.9  Total Bilirubin 0.0 - 1.2 mg/dL - 0.3 0.2  Alkaline Phos 44 - 121 IU/L - 106 92  AST 0 - 40 IU/L - 18 16  ALT 0 - 44 IU/L - 23 17   Lipid Panel     Component Value Date/Time   CHOL 116 03/13/2020 1105   TRIG 96 03/13/2020 1105   HDL 36 (L) 03/13/2020 1105   CHOLHDL 3.2 03/13/2020 1105   CHOLHDL 4.1 09/16/2014 1744   VLDL 22 09/16/2014 1744   LDLCALC 62 03/13/2020 1105    CBC    Component Value Date/Time   WBC 7.6 03/13/2020 1105   WBC 7.9 06/06/2013 1156   RBC 5.46 03/13/2020 1105   RBC 5.71 06/06/2013 1156   HGB 16.0 03/13/2020 1105   HCT 47.9 03/13/2020 1105   PLT 225 03/13/2020 1105   MCV 88 03/13/2020 1105   MCH 29.3 03/13/2020 1105   MCH 29.1 06/06/2013 1156   MCHC 33.4 03/13/2020 1105   MCHC 34.6 06/06/2013 1156   RDW 13.2 03/13/2020 1105   LYMPHSABS 2.4 06/06/2013 1156   MONOABS 0.6 06/06/2013 1156   EOSABS 0.2 06/06/2013 1156   BASOSABS 0.0 06/06/2013 1156    ASSESSMENT AND PLAN: 1. Type 2 diabetes mellitus with morbid obesity (HCC) Not at goal.  Stop metformin due to GI intolerance.  We discussed trying him with Trulicity.  I went over with him how the medication works and the benefit of some weight loss with the medication.  Advised that the medicine can sometimes cause pancreatitis.  If he develops any vomiting or epigastric abdominal pain, patient advised to stop the medication and give me a  call as this could be indicative of pancreatitis.  He is agreeable to trying the medication.  Clinical pharmacist to teach Trulicity administration. -Dietary counseling given. Hold off on exercise for now until he is seen by cardiology. Advised to check blood sugars at least once a day alternating before breakfast and before dinner.  Bring in the log in 1 month to see the clinical pharmacist. Is to get eye exam when he can afford it.  I have told him of reasonably priced places to get an eye exam done. - POCT glucose (manual entry) - POCT glycosylated hemoglobin (Hb A1C) - Dulaglutide (TRULICITY) 0.75 MG/0.5ML SOPN; Inject 0.75 mg into the skin once a week.  Dispense: 2 mL; Refill: 5  2. Essential hypertension At goal on current medications.  Continue current medications and low-salt diet.  3. Dyspnea on exertion This may be an angina equivalent versus deconditioning.  We will refer him to cardiology for further evaluation.  Continue statin therapy.  Add baby aspirin which she will purchase over-the-counter. - Ambulatory referral to Cardiology  4. Claudication of both lower extremities [facility]) Patient with claudication symptoms in the legs.  I recommend taking a baby aspirin daily.  Continue Lipitor.  Referred for ABI. - VAS [place] ABI WITH/WO TBI; Future      Patient was given the opportunity to ask questions.  Patient verbalized understanding of the plan and was able to repeat key elements of the plan.   Orders Placed This Encounter  Procedures   Ambulatory referral to Cardiology   POCT glucose (manual entry)   POCT glycosylated hemoglobin (Hb A1C)  VAS [place] ABI WITH/WO TBI     Requested Prescriptions   Signed Prescriptions Disp Refills   Dulaglutide (TRULICITY) 0.75 MG/0.5ML SOPN 2 mL 5    Sig: Inject 0.75 mg into the skin once a week.    Return in about 4 months (around 02/08/2021) for Give appt with Franky Macho in 1 mth for DM recheck.  Jonah Blue, MD, FACP

## 2020-10-09 NOTE — Patient Instructions (Signed)
Stop metformin. Start Trulicity once a week.  Try to check your blood sugars at least once a day before meals with goal being 90-130.  I have referred you to the cardiologist for further evaluation of the shortness of breath with exertion. I have referred you for study on her leg to evaluate for possible peripheral vascular disease. Start taking baby aspirin 81 mg daily.

## 2020-10-09 NOTE — Progress Notes (Signed)
Patient was educated on the use of the Trulicity pen. Reviewed necessary supplies and operation of the pen. Also reviewed goal blood glucose levels. Patient was able to demonstrate use. All questions and concerns were addressed.  Luis Larson, PharmD, BCACP, CPP Clinical Pharmacist [facility] [phone]  

## 2020-10-14 ENCOUNTER — Other Ambulatory Visit: Payer: Self-pay | Admitting: Internal Medicine

## 2020-10-14 ENCOUNTER — Other Ambulatory Visit: Payer: Self-pay

## 2020-10-14 ENCOUNTER — Ambulatory Visit (HOSPITAL_COMMUNITY): Payer: Medicaid Other

## 2020-10-14 DIAGNOSIS — E785 Hyperlipidemia, unspecified: Secondary | ICD-10-CM

## 2020-10-14 DIAGNOSIS — G4700 Insomnia, unspecified: Secondary | ICD-10-CM

## 2020-10-14 DIAGNOSIS — M19011 Primary osteoarthritis, right shoulder: Secondary | ICD-10-CM

## 2020-10-14 DIAGNOSIS — I10 Essential (primary) hypertension: Secondary | ICD-10-CM

## 2020-10-14 MED ORDER — TRAZODONE HCL 50 MG PO TABS
ORAL_TABLET | ORAL | 1 refills | Status: DC
  Filled 2020-10-14: qty 30, 30d supply, fill #0
  Filled 2021-02-20: qty 30, 30d supply, fill #1
  Filled 2021-09-23: qty 30, 30d supply, fill #0

## 2020-10-14 NOTE — Telephone Encounter (Signed)
   Notes to clinic:  Medication filled by different provider  Review for refills   Requested Prescriptions  Pending Prescriptions Disp Refills   amLODipine (NORVASC) 10 MG tablet 30 tablet 0    Sig: Take 1 tablet (10 mg total) by mouth daily.      There is no refill protocol information for this order      atorvastatin (LIPITOR) 20 MG tablet 30 tablet 0    Sig: Take 1 tablet (20 mg total) by mouth daily.      There is no refill protocol information for this order      celecoxib (CELEBREX) 200 MG capsule 30 capsule 0    Sig: Take 1 capsule (200 mg total) by mouth daily.      There is no refill protocol information for this order      glimepiride (AMARYL) 4 MG tablet 30 tablet 0    Sig: Take 1 tablet (4 mg total) by mouth daily with breakfast.      There is no refill protocol information for this order      lisinopril (ZESTRIL) 20 MG tablet 30 tablet 0    Sig: Take 1 tablet (20 mg total) by mouth daily.      There is no refill protocol information for this order

## 2020-10-15 ENCOUNTER — Other Ambulatory Visit: Payer: Self-pay

## 2020-10-15 MED ORDER — CELECOXIB 200 MG PO CAPS
200.0000 mg | ORAL_CAPSULE | Freq: Every day | ORAL | 2 refills | Status: AC
  Filled 2020-10-15: qty 90, 90d supply, fill #0

## 2020-10-15 MED ORDER — GLIMEPIRIDE 4 MG PO TABS
4.0000 mg | ORAL_TABLET | Freq: Every day | ORAL | 2 refills | Status: DC
  Filled 2020-10-15: qty 90, 90d supply, fill #0

## 2020-10-15 MED ORDER — LISINOPRIL 20 MG PO TABS
20.0000 mg | ORAL_TABLET | Freq: Every day | ORAL | 2 refills | Status: DC
  Filled 2020-10-15: qty 90, 90d supply, fill #0

## 2020-10-15 MED ORDER — AMLODIPINE BESYLATE 10 MG PO TABS
10.0000 mg | ORAL_TABLET | Freq: Every day | ORAL | 2 refills | Status: DC
  Filled 2020-10-15: qty 90, 90d supply, fill #0

## 2020-10-15 MED ORDER — ATORVASTATIN CALCIUM 20 MG PO TABS
20.0000 mg | ORAL_TABLET | Freq: Every day | ORAL | 2 refills | Status: DC
  Filled 2020-10-15: qty 90, 90d supply, fill #0

## 2020-10-17 ENCOUNTER — Other Ambulatory Visit: Payer: Self-pay

## 2020-11-04 ENCOUNTER — Ambulatory Visit (HOSPITAL_COMMUNITY)
Admission: RE | Admit: 2020-11-04 | Discharge: 2020-11-04 | Disposition: A | Discharge status: Home / Self Care | Payer: Self-pay | Source: Ambulatory Visit | Attending: Internal Medicine | Admitting: Internal Medicine

## 2020-11-04 ENCOUNTER — Other Ambulatory Visit: Payer: Self-pay

## 2020-11-04 DIAGNOSIS — I739 Peripheral vascular disease, unspecified: Secondary | ICD-10-CM | POA: Insufficient documentation

## 2020-11-04 NOTE — Progress Notes (Signed)
ABI w/ TBI study completed.   Please see CV Proc for preliminary results.   Chiniqua Kilcrease, RDMS, RVT  

## 2020-11-05 ENCOUNTER — Telehealth: Payer: Self-pay | Admitting: Internal Medicine

## 2020-11-05 NOTE — Telephone Encounter (Signed)
Phone call placed to patient today to go over the results of the ABI that was done on his lower extremities.  I left a message informing him of who I am and that I was calling to go over this results.  I will send results of the study to my medical assistant and will also send the results to him via his MyChart account.  Request that patient look at his MyChart account.

## 2020-11-06 ENCOUNTER — Encounter: Payer: Self-pay | Admitting: Pharmacist

## 2020-11-06 ENCOUNTER — Ambulatory Visit: Payer: Self-pay | Attending: Internal Medicine | Admitting: Pharmacist

## 2020-11-06 ENCOUNTER — Other Ambulatory Visit: Payer: Self-pay

## 2020-11-06 DIAGNOSIS — E1169 Type 2 diabetes mellitus with other specified complication: Secondary | ICD-10-CM

## 2020-11-06 LAB — GLUCOSE, POCT (MANUAL RESULT ENTRY): POC Glucose: 276 mg/dl — AB (ref 70–99)

## 2020-11-06 MED ORDER — METFORMIN HCL ER 500 MG PO TB24
500.0000 mg | ORAL_TABLET | Freq: Every day | ORAL | 2 refills | Status: DC
  Filled 2020-11-06: qty 90, 90d supply, fill #0

## 2020-11-06 MED ORDER — TRULICITY 1.5 MG/0.5ML ~~LOC~~ SOAJ
1.5000 mg | SUBCUTANEOUS | 2 refills | Status: DC
Start: 2020-11-06 — End: 2020-12-09
  Filled 2020-11-06: qty 2, 28d supply, fill #0

## 2020-11-06 NOTE — Progress Notes (Signed)
    S:    PCP: Dr. Laural Benes   No chief complaint on file.  Patient arrives in good spirits. Presents for diabetes evaluation, education, and management. Patient was referred and last seen by Primary Care Provider on 10/09/2020. I taught him how to use Trulicity while he was in clinic.    Patient reports Diabetes was diagnosed in >10 years ago. He has been using the Trulicity. Denies any abdominal pain/vomiting. Does endorse occasional nausea but he tells me this does not occur frequently and he manages it. He wishes to continue with the Trulicity. Metformin was stopped at his last visit d/t GI intolerance.    Family/Social History:  - Fhx: DM, heart disease  - Tobacco: former smoker (quit in Jan of this year)  - Alcohol: none reported   Insurance coverage/medication affordability: self pay  Medication adherence reported.   Current diabetes medications include: glimepiride 4 mg daily, Trulicity 0.75 mg weekly   Patient denies hypoglycemic events  Patient reported dietary habits:  - Eats a small breakfast but a larger dinner  - Trying to cut back on bread - Drinks diet tea and water   Patient-reported exercise habits:  - Limited d/t claudication, dyspnea on exertion   Patient denies nocturia (nighttime urination).  Patient denies neuropathy (nerve pain). Patient denies visual changes. Patient reports self foot exams.     O:  POCT: 276  Lab Results  Component Value Date   HGBA1C 9.0 (A) 10/09/2020   There were no vitals filed for this visit.  Lipid Panel     Component Value Date/Time   CHOL 116 03/13/2020 1105   TRIG 96 03/13/2020 1105   HDL 36 (L) 03/13/2020 1105   CHOLHDL 3.2 03/13/2020 1105   CHOLHDL 4.1 09/16/2014 1744   VLDL 22 09/16/2014 1744   LDLCALC 62 03/13/2020 1105    Home fasting blood sugars: no meter. Endorses readings >200s.    Clinical Atherosclerotic Cardiovascular Disease (ASCVD): No  The ASCVD Risk score Denman George DC Jr., et al., 2013) failed to  calculate for the following reasons:   The valid total cholesterol range is 130 to 320 mg/dL   A/P: Diabetes longstanding currently uncontrolled. Patient is able to verbalize appropriate hypoglycemia management plan. Medication adherence appears appropriate.   I did some searching in his chart. Between [id number], he took XR metformin with better GI tolerance. He cannot tolerate regular release metformin. Pt is amenable to trying 500 mg XR after his largest meal. He was taking regular metformin after breakfast and he tells me today he doesn't eat a big breakfast. This is contributory to his GI symptoms as well. He will try to take 500 mg XR after supper.   -Started metformin 500 mg XR daily. Take after dinner.  -Increased dose of Trulicity to 1.5 mg weekly.  -Extensively discussed pathophysiology of diabetes, recommended lifestyle interventions, dietary effects on blood sugar control -Counseled on s/sx of and management of hypoglycemia -Next A1C anticipated 12/2020.   Written patient instructions provided.  Total time in face to face counseling 30 minutes.   Follow up Pharmacist Clinic Visit in 1 month.    Butch Penny, PharmD, P[place]y [place], CPP Clinical Pharmacist [facility] [phone]

## 2020-11-12 ENCOUNTER — Ambulatory Visit: Payer: Self-pay | Admitting: Cardiology

## 2020-11-12 ENCOUNTER — Other Ambulatory Visit: Payer: Self-pay

## 2020-11-13 ENCOUNTER — Other Ambulatory Visit: Payer: Self-pay

## 2020-12-09 ENCOUNTER — Other Ambulatory Visit: Payer: Self-pay

## 2020-12-09 ENCOUNTER — Ambulatory Visit: Payer: Self-pay | Attending: Internal Medicine | Admitting: Pharmacist

## 2020-12-09 DIAGNOSIS — E1169 Type 2 diabetes mellitus with other specified complication: Secondary | ICD-10-CM

## 2020-12-09 LAB — GLUCOSE, POCT (MANUAL RESULT ENTRY): POC Glucose: 203 mg/dl — AB (ref 70–99)

## 2020-12-09 MED ORDER — TRUEPLUS LANCETS 28G MISC
2 refills | Status: DC
Start: 2020-12-09 — End: 2021-11-03
  Filled 2020-12-09 – 2021-10-29 (×3): qty 100, 25d supply, fill #0

## 2020-12-09 MED ORDER — TRUE METRIX BLOOD GLUCOSE TEST VI STRP
ORAL_STRIP | 12 refills | Status: DC
  Filled 2020-12-09 – 2021-10-29 (×3): qty 100, 25d supply, fill #0

## 2020-12-09 MED ORDER — TRUE METRIX METER W/DEVICE KIT
PACK | 0 refills | Status: DC
  Filled 2020-12-09: qty 1, 30d supply, fill #0

## 2020-12-09 MED ORDER — METFORMIN HCL ER 500 MG PO TB24
500.0000 mg | ORAL_TABLET | Freq: Every day | ORAL | 2 refills | Status: DC
  Filled 2020-12-09: qty 30, 30d supply, fill #0

## 2020-12-09 MED ORDER — TRULICITY 1.5 MG/0.5ML ~~LOC~~ SOAJ
1.5000 mg | SUBCUTANEOUS | 1 refills | Status: DC
Start: 2020-12-09 — End: 2021-01-20
  Filled 2020-12-09: qty 2, 28d supply, fill #0
  Filled 2021-01-12: qty 2, 28d supply, fill #1

## 2020-12-09 MED ORDER — METFORMIN HCL ER 500 MG PO TB24
1000.0000 mg | ORAL_TABLET | Freq: Every day | ORAL | 2 refills | Status: DC
  Filled 2020-12-09: qty 60, 30d supply, fill #0
  Filled 2021-05-11: qty 180, 90d supply, fill #0

## 2020-12-09 NOTE — Progress Notes (Signed)
    S:    PCP: Dr. Laural Benes   No chief complaint on file.  Patient arrives in good spirits. Presents for diabetes evaluation, education, and management. Patient was referred and last seen by Primary Care Provider on 10/09/2020. I taught him how to use Trulicity during that visit. I saw him on 11/06/2020 and increased Trulicity to 1.5 mg weekly. Additionally, we added metformin 500 mg XR.   Today, he reports doing well. Denies any GI upset or diarrhea. Denies NV, abdominal pain.   Family/Social History:  - Fhx: DM, heart disease  - Tobacco: former smoker (quit in Jan of this year)  - Alcohol: none reported   Insurance coverage/medication affordability: self pay  Medication adherence reported.   Current diabetes medications include: glimepiride 4 mg daily, Trulicity 1.5 mg weekly, metformin 500 mg XR daily  Patient denies hypoglycemic events  Patient reported dietary habits:  - Eats a small breakfast but a larger dinner  - Trying to cut back on bread - Drinks diet tea and water   Patient-reported exercise habits:  - Limited d/t claudication, dyspnea on exertion   Patient denies nocturia (nighttime urination).  Patient denies neuropathy (nerve pain). Patient denies visual changes. Patient reports self foot exams.     O:  POCT: 203 (post-prandial; ate toast and eggs for breakfast this AM)  Lab Results  Component Value Date   HGBA1C 9.0 (A) 10/09/2020   There were no vitals filed for this visit.  Lipid Panel     Component Value Date/Time   CHOL 116 03/13/2020 1105   TRIG 96 03/13/2020 1105   HDL 36 (L) 03/13/2020 1105   CHOLHDL 3.2 03/13/2020 1105   CHOLHDL 4.1 09/16/2014 1744   VLDL 22 09/16/2014 1744   LDLCALC 62 03/13/2020 1105    Home fasting blood sugars: no meter. Endorses readings in the high 100s since titration of Trulicity and addition of metformin.   Clinical Atherosclerotic Cardiovascular Disease (ASCVD): No  The ASCVD Risk score Denman George DC Jr., et al.,  2013) failed to calculate for the following reasons:   The valid total cholesterol range is 130 to 320 mg/dL   A/P: Diabetes longstanding currently uncontrolled. Patient is able to verbalize appropriate hypoglycemia management plan. Medication adherence appears appropriate. He wishes to try increasing metformin to 1000 mg daily. He does not wish to titrate to 2000 mg daily as this dose caused significant diarrhea in the past. -Increase metformin to 1000 mg XR daily. Take after dinner. Pt to take two 500 mg tablets.  -Continue Trulicity to 1.5 mg weekly.  -Continue glimepiride 4 mg daily.  -Extensively discussed pathophysiology of diabetes, recommended lifestyle interventions, dietary effects on blood sugar control -Counseled on s/sx of and management of hypoglycemia -Next A1C anticipated 12/2020.   Written patient instructions provided.  Total time in face to face counseling 30 minutes.  Follow up PCP Clinic Visit in October.   Butch Penny, PharmD, [place]y [place], CPP Clinical Pharmacist [facility] [phone]

## 2021-01-12 ENCOUNTER — Other Ambulatory Visit: Payer: Self-pay

## 2021-01-15 ENCOUNTER — Other Ambulatory Visit: Payer: Self-pay

## 2021-01-20 ENCOUNTER — Telehealth: Payer: Self-pay | Admitting: Internal Medicine

## 2021-01-20 DIAGNOSIS — I10 Essential (primary) hypertension: Secondary | ICD-10-CM

## 2021-01-20 DIAGNOSIS — E1169 Type 2 diabetes mellitus with other specified complication: Secondary | ICD-10-CM

## 2021-01-20 DIAGNOSIS — E785 Hyperlipidemia, unspecified: Secondary | ICD-10-CM

## 2021-01-20 MED ORDER — LISINOPRIL 20 MG PO TABS
20.0000 mg | ORAL_TABLET | Freq: Every day | ORAL | 0 refills | Status: DC
  Filled 2021-01-20: qty 30, 30d supply, fill #0

## 2021-01-20 MED ORDER — GLIMEPIRIDE 4 MG PO TABS
4.0000 mg | ORAL_TABLET | Freq: Every day | ORAL | 0 refills | Status: DC
  Filled 2021-01-20: qty 30, 30d supply, fill #0

## 2021-01-20 MED ORDER — AMLODIPINE BESYLATE 10 MG PO TABS
10.0000 mg | ORAL_TABLET | Freq: Every day | ORAL | 0 refills | Status: DC
  Filled 2021-01-20: qty 30, 30d supply, fill #0

## 2021-01-20 MED ORDER — TRULICITY 1.5 MG/0.5ML ~~LOC~~ SOAJ
1.5000 mg | SUBCUTANEOUS | 0 refills | Status: DC
  Filled 2021-01-20: qty 6, 84d supply, fill #0

## 2021-01-20 MED ORDER — ATORVASTATIN CALCIUM 20 MG PO TABS
20.0000 mg | ORAL_TABLET | Freq: Every day | ORAL | 0 refills | Status: DC
  Filled 2021-01-20: qty 30, 30d supply, fill #0

## 2021-01-20 NOTE — Telephone Encounter (Signed)
Patient stated he will be out of medication before he appt with Dr. Laural Benes on 10/31. Would he be able to get a 30 day supply to last until his appt.

## 2021-01-20 NOTE — Telephone Encounter (Signed)
Done

## 2021-01-21 ENCOUNTER — Other Ambulatory Visit: Payer: Self-pay

## 2021-01-22 ENCOUNTER — Other Ambulatory Visit: Payer: Self-pay

## 2021-01-25 ENCOUNTER — Other Ambulatory Visit: Payer: Self-pay

## 2021-01-25 ENCOUNTER — Emergency Department (HOSPITAL_BASED_OUTPATIENT_CLINIC_OR_DEPARTMENT_OTHER)
Admission: EM | Admit: 2021-01-25 | Discharge: 2021-01-25 | Disposition: A | Discharge status: Home / Self Care | Payer: Medicaid Other | Attending: Emergency Medicine | Admitting: Emergency Medicine

## 2021-01-25 ENCOUNTER — Encounter (HOSPITAL_BASED_OUTPATIENT_CLINIC_OR_DEPARTMENT_OTHER): Payer: Self-pay

## 2021-01-25 DIAGNOSIS — Z79899 Other long term (current) drug therapy: Secondary | ICD-10-CM | POA: Insufficient documentation

## 2021-01-25 DIAGNOSIS — I10 Essential (primary) hypertension: Secondary | ICD-10-CM | POA: Insufficient documentation

## 2021-01-25 DIAGNOSIS — Z7982 Long term (current) use of aspirin: Secondary | ICD-10-CM | POA: Insufficient documentation

## 2021-01-25 DIAGNOSIS — M79601 Pain in right arm: Secondary | ICD-10-CM

## 2021-01-25 DIAGNOSIS — E119 Type 2 diabetes mellitus without complications: Secondary | ICD-10-CM | POA: Insufficient documentation

## 2021-01-25 DIAGNOSIS — Z87891 Personal history of nicotine dependence: Secondary | ICD-10-CM | POA: Insufficient documentation

## 2021-01-25 DIAGNOSIS — Z7984 Long term (current) use of oral hypoglycemic drugs: Secondary | ICD-10-CM | POA: Insufficient documentation

## 2021-01-25 DIAGNOSIS — M62838 Other muscle spasm: Secondary | ICD-10-CM | POA: Insufficient documentation

## 2021-01-25 MED ORDER — LIDOCAINE 5 % EX OINT: 1.0000 "application " | TOPICAL_OINTMENT | CUTANEOUS | 0 refills | Status: DC | PRN

## 2021-01-25 MED ORDER — PREDNISONE 10 MG PO TABS: 40.0000 mg | ORAL_TABLET | Freq: Every day | ORAL | 0 refills | Status: DC

## 2021-01-25 MED ORDER — METHOCARBAMOL 500 MG PO TABS: 500.0000 mg | ORAL_TABLET | Freq: Two times a day (BID) | ORAL | 0 refills | Status: DC

## 2021-01-25 MED ORDER — KETOROLAC TROMETHAMINE 60 MG/2ML IM SOLN
30.0000 mg | Freq: Once | INTRAMUSCULAR | Status: AC
  Administered 2021-01-25: 30 mg via INTRAMUSCULAR
  Filled 2021-01-25: qty 2

## 2021-01-25 NOTE — ED Notes (Signed)
ED Provider at bedside. 

## 2021-01-25 NOTE — ED Triage Notes (Signed)
Pt states he thinks he pinched a nerve on Wednesday. Started having pain behind shoulder blade and in elbow down into hand. States last 3 fingers on right hand are numb. Denies chest pain/SOB.

## 2021-01-25 NOTE — Discharge Instructions (Signed)
You were seen here today for evaluation of your right arm pain/numbness.  You been referred to an orthopedic provider that she can follow-up with.  This is most likely MSK pain.  You been prescribed topical lidocaine ointment to apply to the area as discussed previously, i.e. inside of your elbow, back of your upper arm, base of your thumb.  Additionally, you have been prescribed a steroid called prednisone that she will take 4 tablets by mouth for the next 5 days.  Additionally, you have been prescribed Robaxin, a muscle relaxer.  Please not take this medication while driving or operating heavy machinery as it may make you drowsy.  If you have any new or worsening symptoms, or any concern at all, please return to the nearest emergency department.

## 2021-01-25 NOTE — ED Provider Notes (Signed)
[facility] EMERGENCY DEPARTMENT Provider Note   CSN: [id number] Arrival date & time: 01/25/21  8676     History Chief Complaint  Patient presents with   Arm Pain    Luis Larson is a 65 y.o. male Luis Larson to the emergency department for evaluation of right arm burning pain and numbness with the last 3 fingers for the past 5 days.  Patient reports Luis Larson was reaching to get into his truck when Luis Larson felt the pins between his spine and shoulder blade and had numbness in his fingertips along with burning pain on the ulnar side of his forearm.  Luis Larson denies any traumas, falls, MVC's.  Denies any recent heavy lifting, or injury to the area.  Luis Larson denies any neck or back pain.  Denies any fevers or tingling.  Denies any IV drug use ever.  Denies any headache, slurred speech, or weakness to the area.  Medical history includes diabetes.  No known drug allergies.  Former smoker.   Arm Pain Pertinent negatives include no chest pain, no abdominal pain and no shortness of breath.      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 No pertinent surgical history.     Family History  Problem Relation Age of Onset   Diabetes Mother    Heart disease Mother    Heart disease Father     Social History   Tobacco Use   Smoking status: Former     Packs/day: 2.00    Types: Cigarettes    Quit date: 04/2020    Years since quitting: 0.7   Smokeless tobacco: Never  Vaping Use   Vaping Use: Never used  Substance Use Topics   Alcohol use: No   Drug use: No    Home Medications Prior to Admission medications   Medication Sig Start Date End Date Taking? Authorizing Provider  amLODipine (NORVASC) 10 MG tablet Take 1 tablet (10 mg total) by mouth daily. 01/20/21  Yes Ladell Pier, MD  aspirin EC 81 MG tablet Take 1 tablet (81 mg total) by mouth daily. 12/16/17  Yes Ladell Pier, MD  atorvastatin (LIPITOR) 20 MG tablet Take 1 tablet (20 mg total) by mouth daily. 01/20/21  Yes Ladell Pier, MD  Blood Glucose Monitoring Suppl (TRUE METRIX METER) w/Device KIT Check blood sugar three times daily 12/09/20  Yes Ladell Pier, MD  Dulaglutide (TRULICITY) 1.5 HM/0.9OB SOPN Inject 1.5 mg into the skin once a week. 01/20/21  Yes Ladell Pier, MD  glimepiride (AMARYL) 4 MG tablet Take 1 tablet (4 mg total) by mouth daily with breakfast. 01/20/21  Yes Ladell Pier, MD  glucose blood (TRUE METRIX BLOOD GLUCOSE TEST) test strip Check blood sugar three times daily 12/09/20  Yes Ladell Pier, MD  hydrochlorothiazide (HYDRODIURIL) 25 MG tablet Take 1 tablet (25 mg total) by mouth daily.  08/26/20  Yes Ladell Pier, MD  lidocaine (XYLOCAINE) 5 % ointment Apply 1 application topically as needed. 01/25/21  Yes Sherrell Puller, PA-C  lisinopril (ZESTRIL) 20 MG tablet Take 1 tablet (20 mg total) by mouth daily. 01/20/21  Yes Ladell Pier, MD  metFORMIN (GLUCOPHAGE-XR) 500 MG 24 hr tablet Take 2 tablets (1,000 mg total) by mouth daily with breakfast. 12/09/20  Yes Ladell Pier, MD  methocarbamol (ROBAXIN) 500 MG tablet Take 1 tablet (500 mg total) by mouth 2 (two) times daily. 01/25/21  Yes Sherrell Puller, PA-C  predniSONE (DELTASONE) 10 MG tablet Take 4 tablets (40 mg total) by mouth daily. 01/25/21  Yes Sherrell Puller, PA-C  sildenafil (VIAGRA) 100 MG tablet TAKE 1/2 TO 1 TAB BY MOUTH 1 HOUR PRIOR TO INTERCOURSE AS NEEDED 07/03/19  Yes Ladell Pier, MD  traZODone (DESYREL) 50 MG tablet TAKE 0.5-1 TABLETS (25-50 MG TOTAL) BY MOUTH AT BEDTIME AS NEEDED FOR SLEEP. 10/14/20 10/14/21 Yes Ladell Pier, MD  TRUEplus Lancets 28G MISC Check blood sugar three times daily 12/09/20  Yes Ladell Pier, MD    Allergies    Patient has no known allergies.  Review of Systems   Review of Systems  Constitutional:  Negative for chills and fever.  HENT:  Negative for ear pain and sore throat.   Eyes:  Negative for pain and visual disturbance.  Respiratory:  Negative for cough and shortness of breath.   Cardiovascular:  Negative for chest pain and palpitations.  Gastrointestinal:  Negative for abdominal pain and vomiting.  Genitourinary:  Negative for dysuria and hematuria.  Musculoskeletal:  Positive for myalgias. Negative for arthralgias, back pain, gait problem and neck pain.  Skin:  Negative for color change and rash.  Neurological:  Positive for numbness. Negative for seizures, syncope, facial asymmetry and weakness.  All other systems reviewed and are negative.  Physical Exam Updated Vital Signs BP 138/68 (BP Location: Left Arm)   Pulse 60   Temp 97.8 F (36.6 C) (Oral)   Resp 18   Ht 5' 11"  (1.803 m)   Wt 122.5 kg   SpO2 94%   BMI 37.66 kg/m   Physical Exam Vitals and nursing note reviewed.  Constitutional:      Appearance: Normal appearance.  HENT:     Head: Normocephalic and atraumatic.  Eyes:     General: No scleral icterus. Cardiovascular:     Rate and Rhythm: Normal rate and regular rhythm.     Pulses: Normal pulses.  Pulmonary:     Effort: Pulmonary effort is normal.     Breath sounds: Normal breath sounds.  Abdominal:     General: Abdomen is flat. Bowel sounds are normal.     Palpations: Abdomen is soft.     Tenderness: There is no guarding or rebound.   Musculoskeletal:        General: No swelling, tenderness or deformity.     Cervical back: Normal range of motion. No rigidity or tenderness.     Comments: No midline cervical, thoracic, lumbar, or sacral spinal tenderness.  No paraspinal cervical, thoracic, lumbar, sacral tenderness to palpation.  No obvious step-offs or deformities.  Palpable muscle spasm located superior to the right shoulder blade.  No overlying skin changes noted to the back area.  Patient's right upper extremity nontender to palpation.  No medial or lateral epicondyle tenderness.  No snuffbox tenderness.  No brachial radialis tenderness to palpation.  No tenderness to the radial nerve in  the humeral groove.  Patient was unable to two-point differentiate on the DIP of fifth finger, otherwise past two-point differential testing.  Tattoos present on arm.  Otherwise, no other overlying skin changes, ecchymosis, hematuria, edema, rashes, or abrasions noted to the area.  Sensation intact.  Radial pulses intact.  Refill less than 2 seconds.  Compartments soft.  Skin:    General: Skin is warm and dry.  Neurological:     General: No focal deficit present.     Mental Status: Luis Larson is alert. Mental status is at baseline.     Motor: No weakness.     Comments: Patient has equal strength in both hands, wrist, forearms, and shoulders.  Sensation intact, other than the DIP of the fifth phalange on the right hand.  Radial pulses intact.  Cap refill less than 2 seconds.    ED Results / Procedures / Treatments   Labs (all labs ordered are listed, but only abnormal results are displayed) Labs Reviewed - No data to display  EKG None  Radiology No results found.  Procedures Procedures   Medications Ordered in ED Medications  ketorolac (TORADOL) injection 30 mg (30 mg Intramuscular Given 01/25/21 1044)    ED Course  I have reviewed the triage vital signs and the nursing notes.  Pertinent labs & imaging results that were available  during my care of the patient were reviewed by me and considered in my medical decision making (see chart for details).  Patient present to the emergency room for evaluation of right arm pain/numbness.  The patient has a palpable muscle spasm superior to the right shoulder blade.  No traumas to the area.  No overlying skin changes, other than tattoos, noted to the area.  Patient has equal strength bilaterally in both the hands with AB and abduction wrist extension and flexion, Forearm extension flexion, Shoulder shrug, and arm AB and abduction.  No cervical neck midline tenderness or paraspinal tenderness.  Patient is able to fully move neck without issue.  I discussed this case with my attending physician who cosigned this note including patient's presenting symptoms, physical exam, and planned diagnostics and interventions. Attending physician stated agreement with plan or made changes to plan which were implemented. Attending physician assessed patient at bedside.  After discussion with my attending, the plan is to give the patient a 5-day course of prednisone 40 mg as well as Robaxin, with Toradol given in the ED.  Topical lidocaine also prescribed as well.  No imaging needed due to lack of mechanism of injury.  We discussed the plan with patient and referred him to orthopedics for further testing. Strict return precautions were given such as for weakness, increasing numbness, tingling, loss of function, increased pain.  Patient agrees with plan.  Patient is stable being discharged home in good condition.  MDM Rules/Calculators/A&P  Final Clinical Impression(s) / ED Diagnoses Final diagnoses:  Right arm pain    Rx / DC Orders ED Discharge Orders          Ordered    predniSONE (DELTASONE) 10 MG tablet  Daily        01/25/21 1110    lidocaine (XYLOCAINE) 5 % ointment  As needed        01/25/21 1110    methocarbamol (ROBAXIN) 500 MG tablet  2 times daily        01/25/21 1112              Sherrell Puller, [place] 01/25/21 2023  Lucrezia Starch, MD 01/26/21 [phone]

## 2021-02-09 ENCOUNTER — Encounter: Payer: Self-pay | Admitting: Internal Medicine

## 2021-02-09 ENCOUNTER — Ambulatory Visit: Payer: Self-pay | Attending: Internal Medicine | Admitting: Internal Medicine

## 2021-02-09 ENCOUNTER — Other Ambulatory Visit: Payer: Self-pay

## 2021-02-09 DIAGNOSIS — I10 Essential (primary) hypertension: Secondary | ICD-10-CM

## 2021-02-09 DIAGNOSIS — I739 Peripheral vascular disease, unspecified: Secondary | ICD-10-CM

## 2021-02-09 DIAGNOSIS — G5691 Unspecified mononeuropathy of right upper limb: Secondary | ICD-10-CM

## 2021-02-09 DIAGNOSIS — R0609 Other forms of dyspnea: Secondary | ICD-10-CM

## 2021-02-09 DIAGNOSIS — E785 Hyperlipidemia, unspecified: Secondary | ICD-10-CM

## 2021-02-09 DIAGNOSIS — E1169 Type 2 diabetes mellitus with other specified complication: Secondary | ICD-10-CM

## 2021-02-09 DIAGNOSIS — Z23 Encounter for immunization: Secondary | ICD-10-CM

## 2021-02-09 HISTORY — DX: Unspecified mononeuropathy of right upper limb: G56.91

## 2021-02-09 HISTORY — DX: Peripheral vascular disease, unspecified: I73.9

## 2021-02-09 LAB — POCT GLYCOSYLATED HEMOGLOBIN (HGB A1C): HbA1c, POC (controlled diabetic range): 6.9 % (ref 0.0–7.0)

## 2021-02-09 LAB — GLUCOSE, POCT (MANUAL RESULT ENTRY): POC Glucose: 159 mg/dl — AB (ref 70–99)

## 2021-02-09 MED ORDER — AMLODIPINE BESYLATE 10 MG PO TABS
10.0000 mg | ORAL_TABLET | Freq: Every day | ORAL | 3 refills | Status: DC
Start: 2021-02-09 — End: 2022-03-01
  Filled 2021-02-09: qty 90, 90d supply, fill #0
  Filled 2021-02-20: qty 30, 30d supply, fill #0
  Filled 2021-03-19: qty 90, 90d supply, fill #1
  Filled 2021-06-29: qty 90, 90d supply, fill #2
  Filled 2021-06-29: qty 90, 90d supply, fill #0
  Filled 2021-09-23: qty 30, 30d supply, fill #1
  Filled 2021-10-29: qty 30, 30d supply, fill #2
  Filled 2021-11-27: qty 30, 30d supply, fill #3
  Filled 2021-12-28: qty 30, 30d supply, fill #4
  Filled 2022-01-22: qty 30, 30d supply, fill #5

## 2021-02-09 MED ORDER — GABAPENTIN 300 MG PO CAPS
300.0000 mg | ORAL_CAPSULE | Freq: Every day | ORAL | 3 refills | Status: DC
  Filled 2021-02-09: qty 90, 90d supply, fill #0

## 2021-02-09 MED ORDER — GLIMEPIRIDE 4 MG PO TABS
4.0000 mg | ORAL_TABLET | Freq: Every day | ORAL | 3 refills | Status: DC
  Filled 2021-02-09: qty 90, 90d supply, fill #0
  Filled 2021-02-20: qty 30, 30d supply, fill #0
  Filled 2021-03-19: qty 90, 90d supply, fill #1
  Filled 2021-06-29 (×3): qty 90, 90d supply, fill #0
  Filled 2021-06-29: qty 90, 90d supply, fill #2
  Filled 2021-09-23: qty 30, 30d supply, fill #1
  Filled 2021-10-29: qty 30, 30d supply, fill #2
  Filled 2021-11-27: qty 30, 30d supply, fill #3
  Filled 2021-12-28: qty 30, 30d supply, fill #4
  Filled 2022-01-22: qty 30, 30d supply, fill #5

## 2021-02-09 MED ORDER — HYDROCHLOROTHIAZIDE 25 MG PO TABS
25.0000 mg | ORAL_TABLET | Freq: Every day | ORAL | 3 refills | Status: DC
  Filled 2021-02-09 – 2021-05-11 (×2): qty 90, 90d supply, fill #0
  Filled 2021-06-29 – 2021-08-31 (×2): qty 90, 90d supply, fill #1
  Filled 2021-11-27: qty 30, 30d supply, fill #2
  Filled 2021-12-28: qty 30, 30d supply, fill #3
  Filled 2022-01-22: qty 30, 30d supply, fill #4

## 2021-02-09 MED ORDER — ATORVASTATIN CALCIUM 20 MG PO TABS
20.0000 mg | ORAL_TABLET | Freq: Every day | ORAL | 3 refills | Status: DC
  Filled 2021-02-09: qty 90, 90d supply, fill #0
  Filled 2021-02-20: qty 30, 30d supply, fill #0
  Filled 2021-03-19: qty 90, 90d supply, fill #1
  Filled 2021-06-29: qty 90, 90d supply, fill #0
  Filled 2021-06-29: qty 90, 90d supply, fill #2
  Filled 2021-09-23: qty 90, 90d supply, fill #1

## 2021-02-09 MED ORDER — TRULICITY 1.5 MG/0.5ML ~~LOC~~ SOAJ
1.5000 mg | SUBCUTANEOUS | 3 refills | Status: DC
  Filled 2021-02-09: qty 2, 28d supply, fill #0
  Filled 2021-03-23: qty 2, 28d supply, fill #1
  Filled 2021-05-11: qty 2, 28d supply, fill #0
  Filled 2021-06-30: qty 2, 28d supply, fill #1
  Filled 2021-08-31: qty 2, 28d supply, fill #2

## 2021-02-09 MED ORDER — CILOSTAZOL 50 MG PO TABS
50.0000 mg | ORAL_TABLET | Freq: Two times a day (BID) | ORAL | 3 refills | Status: DC
  Filled 2021-02-09: qty 180, 90d supply, fill #0
  Filled 2021-09-23: qty 60, 30d supply, fill #0
  Filled 2021-10-29: qty 60, 30d supply, fill #1
  Filled 2021-12-28: qty 60, 30d supply, fill #2
  Filled 2022-01-22: qty 60, 30d supply, fill #3

## 2021-02-09 MED ORDER — LISINOPRIL 20 MG PO TABS
20.0000 mg | ORAL_TABLET | Freq: Every day | ORAL | 3 refills | Status: DC
  Filled 2021-02-09: qty 90, 90d supply, fill #0
  Filled 2021-02-20: qty 30, 30d supply, fill #0
  Filled 2021-03-19: qty 90, 90d supply, fill #1
  Filled 2021-06-29: qty 90, 90d supply, fill #2
  Filled 2021-06-29: qty 90, 90d supply, fill #0

## 2021-02-09 NOTE — Patient Instructions (Signed)
Start gabapentin 300 mg at night.  This is to help decrease the numbness in your arm.  Please let me know once you have been approved for the orange card/cone discount card so that we can refer you to the neurologist.  Once you have been approved for the discount, we will refer you to the vascular surgeon for the peripheral vascular disease.  We have started a new medication called Pletal 50 mg twice a day to help with the symptoms in your legs.

## 2021-02-10 ENCOUNTER — Other Ambulatory Visit: Payer: Self-pay

## 2021-02-11 ENCOUNTER — Other Ambulatory Visit: Payer: Self-pay

## 2021-02-11 LAB — LIPID PANEL
Chol/HDL Ratio: 3.1 ratio (ref 0.0–5.0)
Cholesterol, Total: 135 mg/dL (ref 100–199)
HDL: 43 mg/dL (ref >39)
LDL Chol Calc (NIH): 67 mg/dL (ref 0–99)
Triglycerides: 141 mg/dL (ref 0–149)
VLDL Cholesterol Cal: 25 mg/dL (ref 5–40)

## 2021-02-11 LAB — CBC
Hematocrit: 44.1 % (ref 37.5–51.0)
Hemoglobin: 14.9 g/dL (ref 13.0–17.7)
MCH: 29.5 pg (ref 26.6–33.0)
MCHC: 33.8 g/dL (ref 31.5–35.7)
MCV: 87 fL (ref 79–97)
Platelets: 226 10*3/uL (ref 150–450)
RBC: 5.05 x10E6/uL (ref 4.14–5.80)
RDW: 13.6 % (ref 11.6–15.4)
WBC: 10.1 10*3/uL (ref 3.4–10.8)

## 2021-02-11 LAB — COMPREHENSIVE METABOLIC PANEL
ALT: 31 IU/L (ref 0–44)
AST: 20 IU/L (ref 0–40)
Albumin/Globulin Ratio: 2 (ref 1.2–2.2)
Albumin: 4.7 g/dL (ref 3.8–4.8)
Alkaline Phosphatase: 93 IU/L (ref 44–121)
BUN/Creatinine Ratio: 15 (ref 10–24)
BUN: 16 mg/dL (ref 8–27)
Bilirubin Total: 0.3 mg/dL (ref 0.0–1.2)
CO2: 27 mmol/L (ref 20–29)
Calcium: 9.7 mg/dL (ref 8.6–10.2)
Chloride: 99 mmol/L (ref 96–106)
Creatinine, Ser: 1.05 mg/dL (ref 0.76–1.27)
Globulin, Total: 2.4 g/dL (ref 1.5–4.5)
Glucose: 106 mg/dL — ABNORMAL HIGH (ref 70–99)
Potassium: 4.7 mmol/L (ref 3.5–5.2)
Sodium: 137 mmol/L (ref 134–144)
Total Protein: 7.1 g/dL (ref 6.0–8.5)
eGFR: 80 mL/min/{1.73_m2} (ref >59)

## 2021-02-11 LAB — BRAIN NATRIURETIC PEPTIDE: BNP: 31.4 pg/mL (ref 0.0–100.0)

## 2021-02-20 ENCOUNTER — Telehealth: Payer: Self-pay | Admitting: Internal Medicine

## 2021-02-20 ENCOUNTER — Other Ambulatory Visit: Payer: Self-pay

## 2021-02-20 DIAGNOSIS — I10 Essential (primary) hypertension: Secondary | ICD-10-CM

## 2021-02-20 DIAGNOSIS — E1169 Type 2 diabetes mellitus with other specified complication: Secondary | ICD-10-CM

## 2021-02-20 DIAGNOSIS — G4700 Insomnia, unspecified: Secondary | ICD-10-CM

## 2021-02-20 DIAGNOSIS — E785 Hyperlipidemia, unspecified: Secondary | ICD-10-CM

## 2021-02-20 NOTE — Telephone Encounter (Signed)
Medication Refill - Medication:  amLODipine (NORVASC) 10 MG tablet  lisinopril (ZESTRIL) 20 MG tablet  glimepiride (AMARYL) 4 MG tablet  atorvastatin (LIPITOR) 20 MG tablet  traZODone (DESYREL) 50 MG tablet   Has the patient contacted their pharmacy? Yes.   Contact PCP  Preferred Pharmacy (with phone number or street name):  [facility]  [address], [place] [address]  Phone:  [phone]  Fax:  [phone]   Has the patient been seen for an appointment in the last year OR does the patient have an upcoming appointment? Yes.    Agent: Please be advised that RX refills may take up to 3 business days. We ask that you follow-up with your pharmacy.

## 2021-02-20 NOTE — Telephone Encounter (Signed)
[facility] called and spoke to Luis Larson, Pensions consultant about the refill(s) requested. She says there are refills available on all medications requested and will be ready for pick up after 3 pm today.

## 2021-02-23 ENCOUNTER — Other Ambulatory Visit: Payer: Self-pay

## 2021-03-20 ENCOUNTER — Other Ambulatory Visit: Payer: Self-pay

## 2021-03-23 ENCOUNTER — Other Ambulatory Visit: Payer: Self-pay

## 2021-05-11 ENCOUNTER — Other Ambulatory Visit: Payer: Self-pay

## 2021-06-29 ENCOUNTER — Other Ambulatory Visit: Payer: Self-pay

## 2021-06-30 ENCOUNTER — Other Ambulatory Visit: Payer: Self-pay

## 2021-07-08 ENCOUNTER — Other Ambulatory Visit: Payer: Self-pay

## 2021-08-31 ENCOUNTER — Other Ambulatory Visit: Payer: Self-pay

## 2021-08-31 ENCOUNTER — Ambulatory Visit: Payer: Self-pay

## 2021-08-31 NOTE — Telephone Encounter (Signed)
Patient has OV set for 09/03/2021

## 2021-08-31 NOTE — Telephone Encounter (Addendum)
    Chief Complaint: Pt. Reports SOB that wakes him up x 2 nights. Symptoms: SOB, has a cough as well. Frequency: 2 nights ago Pertinent Negatives: Patient denies chest pain Disposition: [] ED /[] Urgent Care (no appt availability in office) / [] Appointment(In office/virtual)/ []  [facility]/ [] Home Care/ [] Refused Recommended Disposition /[] [facility]/ [x]  Follow-up with PCP Additional Notes:Instructed to call 911 for worsening of symptoms. Pt. Asking to be worked in. Please advise.  Answer Assessment - Initial Assessment Questions 1. RESPIRATORY STATUS: "Describe your breathing?" (e.g., wheezing, shortness of breath, unable to speak, severe coughing)      SOB at night 2. ONSET: "When did this breathing problem begin?"      2 nights ago 3. PATTERN "Does the difficult breathing come and go, or has it been constant since it started?"      Comes and goes 4. SEVERITY: "How bad is your breathing?" (e.g., mild, moderate, severe)    - MILD: No SOB at rest, mild SOB with walking, speaks normally in sentences, can lie down, no retractions, pulse < 100.    - MODERATE: SOB at rest, SOB with minimal exertion and prefers to sit, cannot lie down flat, speaks in phrases, mild retractions, audible wheezing, pulse 100-120.    - SEVERE: Very SOB at rest, speaks in single words, struggling to breathe, sitting hunched forward, retractions, pulse > 120      Moderate 5. RECURRENT SYMPTOM: "Have you had difficulty breathing before?" If Yes, ask: "When was the last time?" and "What happened that time?"      No 6. CARDIAC HISTORY: "Do you have any history of heart disease?" (e.g., heart attack, angina, bypass surgery, angioplasty)      No 7. LUNG HISTORY: "Do you have any history of lung disease?"  (e.g., pulmonary embolus, asthma, emphysema)     No 8. CAUSE: "What do you think is causing the breathing problem?"      Unsure 9. OTHER SYMPTOMS: "Do you have any other symptoms? (e.g.,  dizziness, runny nose, cough, chest pain, fever)     No 10. O2 SATURATION MONITOR:  "Do you use an oxygen saturation monitor (pulse oximeter) at home?" If Yes, "What is your reading (oxygen level) today?" "What is your usual oxygen saturation reading?" (e.g., 95%)       No 11. PREGNANCY: "Is there any chance you are pregnant?" "When was your last menstrual period?"       N/A 12. TRAVEL: "Have you traveled out of the country in the last month?" (e.g., travel history, exposures)       No  Protocols used: Breathing Difficulty-A-AH

## 2021-09-03 ENCOUNTER — Other Ambulatory Visit: Payer: Self-pay

## 2021-09-03 ENCOUNTER — Encounter: Payer: Self-pay | Admitting: Internal Medicine

## 2021-09-03 ENCOUNTER — Ambulatory Visit: Payer: Medicaid Other | Attending: Internal Medicine | Admitting: Internal Medicine

## 2021-09-03 DIAGNOSIS — R0600 Dyspnea, unspecified: Secondary | ICD-10-CM

## 2021-09-03 DIAGNOSIS — E1169 Type 2 diabetes mellitus with other specified complication: Secondary | ICD-10-CM

## 2021-09-03 DIAGNOSIS — Z6841 Body Mass Index (BMI) 40.0 and over, adult: Secondary | ICD-10-CM

## 2021-09-03 DIAGNOSIS — I739 Peripheral vascular disease, unspecified: Secondary | ICD-10-CM

## 2021-09-03 DIAGNOSIS — R0609 Other forms of dyspnea: Secondary | ICD-10-CM

## 2021-09-03 DIAGNOSIS — Z23 Encounter for immunization: Secondary | ICD-10-CM

## 2021-09-03 DIAGNOSIS — I10 Essential (primary) hypertension: Secondary | ICD-10-CM

## 2021-09-03 DIAGNOSIS — G629 Polyneuropathy, unspecified: Secondary | ICD-10-CM

## 2021-09-03 LAB — POCT GLYCOSYLATED HEMOGLOBIN (HGB A1C): HbA1c, POC (controlled diabetic range): 6.7 % (ref 0.0–7.0)

## 2021-09-03 LAB — GLUCOSE, POCT (MANUAL RESULT ENTRY): POC Glucose: 181 mg/dl — AB (ref 70–99)

## 2021-09-03 MED ORDER — LISINOPRIL 20 MG PO TABS
30.0000 mg | ORAL_TABLET | Freq: Every day | ORAL | 6 refills | Status: DC
  Filled 2021-09-03: qty 90, 60d supply, fill #0
  Filled 2021-09-23: qty 45, 30d supply, fill #0
  Filled 2021-10-29: qty 45, 30d supply, fill #1

## 2021-09-03 MED ORDER — BUDESONIDE-FORMOTEROL FUMARATE 80-4.5 MCG/ACT IN AERO
2.0000 | INHALATION_SPRAY | Freq: Two times a day (BID) | RESPIRATORY_TRACT | 3 refills | Status: DC
  Filled 2021-09-03 – 2021-09-23 (×2): qty 10.2, 30d supply, fill #0
  Filled 2021-10-29: qty 10.2, fill #0

## 2021-09-03 MED ORDER — LISINOPRIL 20 MG PO TABS
20.0000 mg | ORAL_TABLET | Freq: Every day | ORAL | 3 refills | Status: DC
  Filled 2021-09-03: qty 90, 90d supply, fill #0

## 2021-09-03 MED ORDER — ALBUTEROL SULFATE HFA 108 (90 BASE) MCG/ACT IN AERS
2.0000 | INHALATION_SPRAY | Freq: Four times a day (QID) | RESPIRATORY_TRACT | 2 refills | Status: DC | PRN
  Filled 2021-09-03: qty 8.5, 25d supply, fill #0

## 2021-09-03 NOTE — Progress Notes (Signed)
Patient ID: Luis Larson, male    DOB: March 30, 1956  MRN: [id number]  CC: Diabetes   Subjective: [name] is a 66 y.o. male who presents for chronic ds management His concerns today include:  Pt with hx of HTN, DM, HL, former tob dep (quit 04/2020), ED, underdeveloped pectoralis muscle right side, OA knees and shoulders.   He has not applied for [employer].    DM:   Results for orders placed or performed in visit on 09/03/21  POCT glucose (manual entry)  Result Value Ref Range   POC Glucose 181 (A) 70 - 99 mg/dl  POCT glycosylated hemoglobin (Hb A1C)  Result Value Ref Range   Hemoglobin A1C     HbA1c POC (<> result, manual entry)     HbA1c, POC (prediabetic range)     HbA1c, POC (controlled diabetic range) 6.7 0.0 - 7.0 %  A1C 6.7 On last visit, we had added Trulicity.  Patient tolerating the medicine well.  However he reports being told recently by the pharmacy that he no longer qualifies to get it through the patient assistance program.  Last dose was 1 month ago.  Still on Metformin and Amaryl Checks BS once a wk.  Range 130s Still has numbness in RT fingers 3rd-5th.  Now intermittent LT hand going numb a lot x 1.5 mths. Does masonary work Gabapentin has not helped.    Waking up middle of night intermittently with SOB x 1.5 wks.   Has to sleep sitting up when this happens.  Used his friend's Albuterol inhaler during one of these episodes and found it to be very helpful.  No wheezing but endorses some coughing.  No CP with these episodes. No LE edema. No anxiety Still having DOE sometimes with minimal activity.  On last visit we had talked about referring him to cardiology to make sure that this was not an angina equivalent given that he has PAD.  However patient wanted to hold off until he was able to get the orange card which he still has not secured. FHx has CAD in his sister  HTN:  taking amlodipine, lisinopril 20 mg daily and hydrochlorothiazide 25 mg daily.  He  limits salt in the foods.  PAD:   Still gets cramps in calf with walking less than a block.  Pletal helps.  On last visit we spoke about referral to vascular surgeon.  However patient is uninsured.  He needs to work on getting approved for the [employer] or [employer].  HM: He is due for shingles vaccine.  He is agreeable to receiving it. Patient Active Problem List   Diagnosis Date Noted   Neuropathy, arm, 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   PAD (peripheral artery disease) ([place]) 02/09/2021   Former smoker 08/26/2020   Influenza vaccine needed 03/13/2020   Insomnia 03/13/2020   Primary osteoarthritis of both shoulders 07/24/2019   Morbid obesity ([place]) 07/24/2019   Chronic pain of both shoulders 03/19/2019   Hyperlipidemia 12/16/2017   Other male erectile dysfunction 10/04/2013   Colon cancer screening 10/04/2013   Diabetes ([place]) 12/22/2012   Essential hypertension 12/22/2012   Type II or unspecified type diabetes mellitus without mention of complication, uncontrolled 10/17/2012     Current Outpatient Medications on File Prior to Visit  Medication Sig Dispense Refill   amLODipine (NORVASC) 10 MG tablet Take 1 tablet (10 mg total) by mouth daily. 90 tablet 3   aspirin EC 81 MG tablet Take 1 tablet (81 mg total) by mouth    Patient ID: Mando Winsett, male    DOB: 05/17/1955  MRN: [id number]  CC: Diabetes   Subjective: Luis Larson is a 66 y.o. male who presents for chronic ds management His concerns today include:  Pt with hx of HTN, DM, HL, former tob dep (quit 04/2020), ED, underdeveloped pectoralis muscle right side, OA knees and shoulders.   He has not applied for [employer].    DM:   Results for orders placed or performed in visit on 09/03/21  POCT glucose (manual entry)  Result Value Ref Range   POC Glucose 181 (A) 70 - 99 mg/dl  POCT glycosylated hemoglobin (Hb A1C)  Result Value Ref Range   Hemoglobin A1C     HbA1c POC (<> result, manual entry)     HbA1c, POC (prediabetic range)     HbA1c, POC (controlled diabetic range) 6.7 0.0 - 7.0 %  A1C 6.7 On last visit, we had added Trulicity.  Patient tolerating the medicine well.  However he reports being told recently by the pharmacy that he no longer qualifies to get it through the patient assistance program.  Last dose was 1 month ago.  Still on Metformin and Amaryl Checks BS once a wk.  Range 130s Still has numbness in RT fingers 3rd-5th.  Now intermittent LT hand going numb a lot x 1.5 mths. Does masonary work Gabapentin has not helped.    Waking up middle of night intermittently with SOB x 1.5 wks.   Has to sleep sitting up when this happens.  Used his friend's Albuterol inhaler during one of these episodes and found it to be very helpful.  No wheezing but endorses some coughing.  No CP with these episodes. No LE edema. No anxiety Still having DOE sometimes with minimal activity.  On last visit we had talked about referring him to cardiology to make sure that this was not an angina equivalent given that he has PAD.  However patient wanted to hold off until he was able to get the orange card which he still has not secured. FHx has CAD in his sister  HTN:  taking amlodipine, lisinopril 20 mg daily and hydrochlorothiazide 25 mg daily.  He  limits salt in the foods.  PAD:   Still gets cramps in calf with walking less than a block.  Pletal helps.  On last visit we spoke about referral to vascular surgeon.  However patient is uninsured.  He needs to work on getting approved for the [employer] or [facility].  HM: He is due for shingles vaccine.  He is agreeable to receiving it. Patient Active Problem List   Diagnosis Date Noted   Neuropathy, arm, 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   PAD (peripheral artery disease) ([facility]) 02/09/2021   Former smoker 08/26/2020   Influenza vaccine needed 03/13/2020   Insomnia 03/13/2020   Primary osteoarthritis of both shoulders 07/24/2019   Morbid obesity ([facility]) 07/24/2019   Chronic pain of both shoulders 03/19/2019   Hyperlipidemia 12/16/2017   Other male erectile dysfunction 10/04/2013   Colon cancer screening 10/04/2013   Diabetes ([facility]) 12/22/2012   Essential hypertension 12/22/2012   Type II or unspecified type diabetes mellitus without mention of complication, uncontrolled 10/17/2012     Current Outpatient Medications on File Prior to Visit  Medication Sig Dispense Refill   amLODipine (NORVASC) 10 MG tablet Take 1 tablet (10 mg total) by mouth daily. 90 tablet 3   aspirin EC 81 MG tablet Take 1 tablet (81 mg total) by mouth   Patient ID: Luis Larson, male    DOB: March 30, 1956  MRN: [id number]  CC: Diabetes   Subjective: [name] is a 66 y.o. male who presents for chronic ds management His concerns today include:  Pt with hx of HTN, DM, HL, former tob dep (quit 04/2020), ED, underdeveloped pectoralis muscle right side, OA knees and shoulders.   He has not applied for [employer].    DM:   Results for orders placed or performed in visit on 09/03/21  POCT glucose (manual entry)  Result Value Ref Range   POC Glucose 181 (A) 70 - 99 mg/dl  POCT glycosylated hemoglobin (Hb A1C)  Result Value Ref Range   Hemoglobin A1C     HbA1c POC (<> result, manual entry)     HbA1c, POC (prediabetic range)     HbA1c, POC (controlled diabetic range) 6.7 0.0 - 7.0 %  A1C 6.7 On last visit, we had added Trulicity.  Patient tolerating the medicine well.  However he reports being told recently by the pharmacy that he no longer qualifies to get it through the patient assistance program.  Last dose was 1 month ago.  Still on Metformin and Amaryl Checks BS once a wk.  Range 130s Still has numbness in RT fingers 3rd-5th.  Now intermittent LT hand going numb a lot x 1.5 mths. Does masonary work Gabapentin has not helped.    Waking up middle of night intermittently with SOB x 1.5 wks.   Has to sleep sitting up when this happens.  Used his friend's Albuterol inhaler during one of these episodes and found it to be very helpful.  No wheezing but endorses some coughing.  No CP with these episodes. No LE edema. No anxiety Still having DOE sometimes with minimal activity.  On last visit we had talked about referring him to cardiology to make sure that this was not an angina equivalent given that he has PAD.  However patient wanted to hold off until he was able to get the orange card which he still has not secured. FHx has CAD in his sister  HTN:  taking amlodipine, lisinopril 20 mg daily and hydrochlorothiazide 25 mg daily.  He  limits salt in the foods.  PAD:   Still gets cramps in calf with walking less than a block.  Pletal helps.  On last visit we spoke about referral to vascular surgeon.  However patient is uninsured.  He needs to work on getting approved for the [employer] or [employer].  HM: He is due for shingles vaccine.  He is agreeable to receiving it. Patient Active Problem List   Diagnosis Date Noted   Neuropathy, arm, 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   PAD (peripheral artery disease) ([place]) 02/09/2021   Former smoker 08/26/2020   Influenza vaccine needed 03/13/2020   Insomnia 03/13/2020   Primary osteoarthritis of both shoulders 07/24/2019   Morbid obesity ([place]) 07/24/2019   Chronic pain of both shoulders 03/19/2019   Hyperlipidemia 12/16/2017   Other male erectile dysfunction 10/04/2013   Colon cancer screening 10/04/2013   Diabetes ([place]) 12/22/2012   Essential hypertension 12/22/2012   Type II or unspecified type diabetes mellitus without mention of complication, uncontrolled 10/17/2012     Current Outpatient Medications on File Prior to Visit  Medication Sig Dispense Refill   amLODipine (NORVASC) 10 MG tablet Take 1 tablet (10 mg total) by mouth daily. 90 tablet 3   aspirin EC 81 MG tablet Take 1 tablet (81 mg total) by mouth    Patient ID: Mando Winsett, male    DOB: 05/17/1955  MRN: [id number]  CC: Diabetes   Subjective: Luis Larson is a 66 y.o. male who presents for chronic ds management His concerns today include:  Pt with hx of HTN, DM, HL, former tob dep (quit 04/2020), ED, underdeveloped pectoralis muscle right side, OA knees and shoulders.   He has not applied for [facility].    DM:   Results for orders placed or performed in visit on 09/03/21  POCT glucose (manual entry)  Result Value Ref Range   POC Glucose 181 (A) 70 - 99 mg/dl  POCT glycosylated hemoglobin (Hb A1C)  Result Value Ref Range   Hemoglobin A1C     HbA1c POC (<> result, manual entry)     HbA1c, POC (prediabetic range)     HbA1c, POC (controlled diabetic range) 6.7 0.0 - 7.0 %  A1C 6.7 On last visit, we had added Trulicity.  Patient tolerating the medicine well.  However he reports being told recently by the pharmacy that he no longer qualifies to get it through the patient assistance program.  Last dose was 1 month ago.  Still on Metformin and Amaryl Checks BS once a wk.  Range 130s Still has numbness in RT fingers 3rd-5th.  Now intermittent LT hand going numb a lot x 1.5 mths. Does masonary work Gabapentin has not helped.    Waking up middle of night intermittently with SOB x 1.5 wks.   Has to sleep sitting up when this happens.  Used his friend's Albuterol inhaler during one of these episodes and found it to be very helpful.  No wheezing but endorses some coughing.  No CP with these episodes. No LE edema. No anxiety Still having DOE sometimes with minimal activity.  On last visit we had talked about referring him to cardiology to make sure that this was not an angina equivalent given that he has PAD.  However patient wanted to hold off until he was able to get the orange card which he still has not secured. FHx has CAD in his sister  HTN:  taking amlodipine, lisinopril 20 mg daily and hydrochlorothiazide 25 mg daily.  He  limits salt in the foods.  PAD:   Still gets cramps in calf with walking less than a block.  Pletal helps.  On last visit we spoke about referral to vascular surgeon.  However patient is uninsured.  He needs to work on getting approved for the [employer] [facility] or [facility].  HM: He is due for shingles vaccine.  He is agreeable to receiving it. Patient Active Problem List   Diagnosis Date Noted   Neuropathy, arm, 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   PAD (peripheral artery disease) ([facility]) 02/09/2021   Former smoker 08/26/2020   Influenza vaccine needed 03/13/2020   Insomnia 03/13/2020   Primary osteoarthritis of both shoulders 07/24/2019   Morbid obesity ([facility]) 07/24/2019   Chronic pain of both shoulders 03/19/2019   Hyperlipidemia 12/16/2017   Other male erectile dysfunction 10/04/2013   Colon cancer screening 10/04/2013   Diabetes ([facility]) 12/22/2012   Essential hypertension 12/22/2012   Type II or unspecified type diabetes mellitus without mention of complication, uncontrolled 10/17/2012     Current Outpatient Medications on File Prior to Visit  Medication Sig Dispense Refill   amLODipine (NORVASC) 10 MG tablet Take 1 tablet (10 mg total) by mouth daily. 90 tablet 3   aspirin EC 81 MG tablet Take 1 tablet (81 mg total) by mouth

## 2021-09-03 NOTE — Patient Instructions (Signed)
Increase lisinopril to 30 mg daily.  This means you will take 1-1/2 of the 20 mg tablet each day. I have sent the prescription to the pharmacy for the Symbicort and albuterol inhalers as was discussed. Please apply for the orange card/[facility] card as soon as possible. I have referred you to the cardiologist.

## 2021-09-03 NOTE — Progress Notes (Signed)
Having trouble with breathing for 1 week. Medication refills.

## 2021-09-04 ENCOUNTER — Other Ambulatory Visit: Payer: Self-pay

## 2021-09-04 LAB — BRAIN NATRIURETIC PEPTIDE: BNP: 38.1 pg/mL (ref 0.0–100.0)

## 2021-09-04 LAB — BASIC METABOLIC PANEL
BUN/Creatinine Ratio: 17 (ref 10–24)
BUN: 18 mg/dL (ref 8–27)
CO2: 26 mmol/L (ref 20–29)
Calcium: 9.8 mg/dL (ref 8.6–10.2)
Chloride: 101 mmol/L (ref 96–106)
Creatinine, Ser: 1.08 mg/dL (ref 0.76–1.27)
Glucose: 129 mg/dL — ABNORMAL HIGH (ref 70–99)
Potassium: 4.4 mmol/L (ref 3.5–5.2)
Sodium: 142 mmol/L (ref 134–144)
eGFR: 77 mL/min/{1.73_m2} (ref >59)

## 2021-09-08 ENCOUNTER — Other Ambulatory Visit: Payer: Self-pay

## 2021-09-14 ENCOUNTER — Other Ambulatory Visit: Payer: Self-pay

## 2021-09-23 ENCOUNTER — Other Ambulatory Visit: Payer: Self-pay

## 2021-09-23 ENCOUNTER — Other Ambulatory Visit: Payer: Self-pay | Admitting: Internal Medicine

## 2021-09-23 DIAGNOSIS — E1169 Type 2 diabetes mellitus with other specified complication: Secondary | ICD-10-CM

## 2021-09-23 MED ORDER — METFORMIN HCL ER 500 MG PO TB24
1000.0000 mg | ORAL_TABLET | Freq: Every day | ORAL | 1 refills | Status: DC
  Filled 2021-09-23: qty 60, 30d supply, fill #0
  Filled 2021-10-29: qty 60, 30d supply, fill #1

## 2021-09-23 NOTE — Telephone Encounter (Signed)
Requested Prescriptions  Pending Prescriptions Disp Refills  . metFORMIN (GLUCOPHAGE-XR) 500 MG 24 hr tablet 180 tablet 1    Sig: Take 2 tablets (1,000 mg total) by mouth daily with breakfast.     Endocrinology:  Diabetes - Biguanides Failed - 09/23/2021  3:29 PM      Failed - B12 Level in normal range and within 720 days    No results found for: "VITAMINB12"       Failed - CBC within normal limits and completed in the last 12 months    WBC  Date Value Ref Range Status  02/09/2021 10.1 3.4 - 10.8 x10E3/uL Final  06/06/2013 7.9 4.0 - 10.5 K/uL Final   RBC  Date Value Ref Range Status  02/09/2021 5.05 4.14 - 5.80 x10E6/uL Final  06/06/2013 5.71 4.22 - 5.81 MIL/uL Final   Hemoglobin  Date Value Ref Range Status  02/09/2021 14.9 13.0 - 17.7 g/dL Final   Hematocrit  Date Value Ref Range Status  02/09/2021 44.1 37.5 - 51.0 % Final   MCHC  Date Value Ref Range Status  02/09/2021 33.8 31.5 - 35.7 g/dL Final  06/06/2013 34.6 30.0 - 36.0 g/dL Final   [facility]  Date Value Ref Range Status  02/09/2021 29.5 26.6 - 33.0 pg Final  06/06/2013 29.1 26.0 - 34.0 pg Final   MCV  Date Value Ref Range Status  02/09/2021 87 79 - 97 fL Final   No results found for: "PLTCOUNTKUC", "LABPLAT", "POCPLA" RDW  Date Value Ref Range Status  02/09/2021 13.6 11.6 - 15.4 % Final         Passed - Cr in normal range and within 360 days    Creatinine  Date Value Ref Range Status  07/24/2019 66.8 20.0 - 300.0 mg/dL Final   Creat  Date Value Ref Range Status  09/16/2014 0.87 0.50 - 1.35 mg/dL Final   Creatinine, Ser  Date Value Ref Range Status  09/03/2021 1.08 0.76 - 1.27 mg/dL Final         Passed - HBA1C is between 0 and 7.9 and within 180 days    HbA1c, POC (controlled diabetic range)  Date Value Ref Range Status  09/03/2021 6.7 0.0 - 7.0 % Final         Passed - eGFR in normal range and within 360 days    GFR, Est African American  Date Value Ref Range Status  09/16/2014 >89 mL/min  Final   GFR calc Af Amer  Date Value Ref Range Status  03/13/2020 93 >59 mL/min/1.73 Final    Comment:    **In accordance with recommendations from the NKF-ASN Task force,**   [facility] is in the process of updating its eGFR calculation to the   2021 CKD-EPI creatinine equation that estimates kidney function   without a race variable.    GFR, Est Non African American  Date Value Ref Range Status  09/16/2014 >89 mL/min Final    Comment:      The estimated GFR is a calculation valid for adults (>=71 years old) that uses the CKD-EPI algorithm to adjust for age and sex. It is   not to be used for children, pregnant women, hospitalized patients,    patients on dialysis, or with rapidly changing kidney function. According to the NKDEP, eGFR >89 is normal, 60-89 shows mild impairment, 30-59 shows moderate impairment, 15-29 shows severe impairment and <15 is ESRD.      GFR calc non Af Amer  Date Value Ref Range Status  03/13/2020 80 >59 mL/min/1.73 Final   eGFR  Date Value Ref Range Status  09/03/2021 77 >59 mL/min/1.73 Final         Passed - Valid encounter within last 6 months    Recent Outpatient Visits          2 weeks ago Type 2 diabetes mellitus with morbid obesity ([place])   [place] Karle Plumber B, MD   7 months ago Type 2 diabetes mellitus with morbid obesity [facility] [place])   [place] Karle Plumber B, MD   9 months ago Type 2 diabetes mellitus with morbid obesity [facility])   [place], Annie Main L, RPH-CPP   10 months ago Type 2 diabetes mellitus with morbid obesity [facility])   [place], Stephen L, RPH-CPP   11 months ago Medication management   [place], RPH-CPP      Future Appointments            In 1 month Bettina Gavia, Hilton Cork, MD [facility]

## 2021-09-24 ENCOUNTER — Other Ambulatory Visit: Payer: Self-pay

## 2021-09-25 ENCOUNTER — Other Ambulatory Visit: Payer: Self-pay

## 2021-09-29 ENCOUNTER — Other Ambulatory Visit: Payer: Self-pay

## 2021-10-29 ENCOUNTER — Other Ambulatory Visit: Payer: Self-pay | Admitting: Internal Medicine

## 2021-10-29 ENCOUNTER — Other Ambulatory Visit: Payer: Self-pay

## 2021-10-29 DIAGNOSIS — G4700 Insomnia, unspecified: Secondary | ICD-10-CM

## 2021-10-29 MED ORDER — TRAZODONE HCL 50 MG PO TABS
ORAL_TABLET | ORAL | 2 refills | Status: AC
  Filled 2021-10-29: qty 30, 30d supply, fill #0
  Filled 2022-01-22: qty 30, 30d supply, fill #1

## 2021-10-30 ENCOUNTER — Other Ambulatory Visit: Payer: Self-pay

## 2021-11-02 NOTE — Progress Notes (Unsigned)
Cardiology Office Note:    Date:  11/03/2021   ID:  Luis Larson, DOB [birth year], MRN [id number]  PCP:  Marcine Matar, MD  Cardiologist:  Norman Herrlich, MD   Referring MD: Marcine Matar, MD  ASSESSMENT:    1. Chest pain of uncertain etiology   2. Essential hypertension   3. Mixed hyperlipidemia   4. Type 2 diabetes mellitus without complication, without long-term current use of insulin (HCC)   5. PAD (peripheral artery disease) (HCC)   6. Precordial pain    PLAN:    In order of problems listed above:  He is a high risk patient with diabetes hypertension cigarette smoking history of peripheral arterial disease is having symptoms quite suggestive of angina.  We discussed modalities for evaluation undergo cardiac CTA to define presence absence severity of CAD and establish if he has findings of COPD by the over read of the chest CT in the interim he will continue his aspirin antihypertensives and high intensity statin.  If high risk markers would benefit from coronary angiography and revascularization Stable hypertension continue his current medications thiazide diuretic ACE controlling his hypertension Stable diabetes A1c at target continue current treatment including glimepiride directed by his PCP Stable continue his high intensity statin LDL is at target with treatment Eventually he will need PAD evaluation with focused duplex of the lower extremities we will review this at his next visit  Next appointment 3 months   Medication Adjustments/Labs and Tests Ordered: Current medicines are reviewed at length with the patient today.  Concerns regarding medicines are outlined above.  Orders Placed This Encounter  Procedures   CT CORONARY MORPH W/CTA COR W/SCORE W/CA W/CM &/OR WO/CM   Basic Metabolic Panel (BMET)   Meds ordered this encounter  Medications   metoprolol tartrate (LOPRESSOR) 25 MG tablet    Sig: Take 1 tablet (25 mg total) by mouth once for 1 dose. Please take  2 hours before CT    Dispense:  1 tablet    Refill:  0     Chief Complaint  Patient presents with   Shortness of Breath    History of Present Illness:    Luis Larson is a 66 y.o. male former smoker with a history of type 2 diabetes hypertension and hyperlipidemia who is being seen today for the evaluation of shortness of breath at the request of Marcine Matar, MD with concerned that his exertional shortness of breath is anginal equivalent.Marland Kitchen  His BNP level was low at 38 and he was treated with a bronchodilator  Lower extremity noninvasive ABIs performed showing moderate decrease both lower extremities.  He has had no previous cardiology evaluation or diagnostic imaging.  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 He is a previous heavy cigarette smoker he does not wheeze does not have a chronic cough but he does get some improvement from his bronchodilator He has developed a pattern of marked exercise intolerance with exertional shortness of breath he has to stop and rest when he walks a distance like a flight of stairs incline or uses his upper extremities.  He also gets some chest tightness associated with it quickly relieved with rest He has no known history of congenital rheumatic heart disease or arrhythmia. No associated palpitation or syncope He is also limited by typical calf claudication. He tolerates his high intensity statin without muscle pain or weakness  Past Medical History:  Diagnosis Date  Diabetes mellitus without complication (HCC)    Essential hypertension, benign 12/22/2012    History reviewed. No pertinent surgical history.  Current Medications: Current Meds  Medication Sig   albuterol (VENTOLIN HFA) 108 (90 Base) MCG/ACT inhaler Inhale 2 puffs into the lungs every 6 (six) hours as needed for wheezing or shortness of breath.   amLODipine (NORVASC) 10 MG tablet Take 1 tablet (10 mg total) by  mouth daily.   aspirin EC 81 MG tablet Take 1 tablet (81 mg total) by mouth daily.   atorvastatin (LIPITOR) 20 MG tablet Take 1 tablet (20 mg total) by mouth daily.   budesonide-formoterol (SYMBICORT) 80-4.5 MCG/ACT inhaler Inhale 2 puffs into the lungs 2 (two) times daily.   cilostazol (PLETAL) 50 MG tablet Take 1 tablet (50 mg total) by mouth 2 (two) times daily. For peripheral vascular disease   cilostazol (PLETAL) 50 MG tablet Take 50 mg by mouth daily.   glimepiride (AMARYL) 4 MG tablet Take 1 tablet (4 mg total) by mouth daily with breakfast.   hydrochlorothiazide (HYDRODIURIL) 25 MG tablet Take 1 tablet (25 mg total) by mouth daily.   metoprolol tartrate (LOPRESSOR) 25 MG tablet Take 1 tablet (25 mg total) by mouth once for 1 dose. Please take 2 hours before CT   sildenafil (VIAGRA) 100 MG tablet TAKE 1/2 TO 1 TAB BY MOUTH 1 HOUR PRIOR TO INTERCOURSE AS NEEDED   traZODone (DESYREL) 50 MG tablet TAKE 0.5-1 TABLETS (25-50 MG TOTAL) BY MOUTH AT BEDTIME AS NEEDED FOR SLEEP.   [DISCONTINUED] lisinopril (ZESTRIL) 20 MG tablet Take 20 mg by mouth daily.   [DISCONTINUED] metFORMIN (GLUCOPHAGE-XR) 500 MG 24 hr tablet Take 500 mg by mouth daily with breakfast.     Allergies:   Shellfish allergy   Social History   Socioeconomic History   Marital status: Divorced    Spouse name: Not on file   Number of children: Not on file   Years of education: Not on file   Highest education level: Not on file  Occupational History   Not on file  Tobacco Use   Smoking status: Former    Packs/day: 2.00    Types: Cigarettes    Quit date: 04/2020    Years since quitting: 1.5   Smokeless tobacco: Never  Vaping Use   Vaping Use: Never used  Substance and Sexual Activity   Alcohol use: No   Drug use: No   Sexual activity: Not on file  Other Topics Concern   Not on file  Social History Narrative   Not on file   Social Determinants of Health   Financial Resource Strain: Not on file  Food  Insecurity: Not on file  Transportation Needs: Not on file  Physical Activity: Not on file  Stress: Not on file  Social Connections: Not on file     Family History: The patient's family history includes Diabetes in his mother; Heart disease in his father and mother.  ROS:   ROS Please see the history of present illness.     All other systems reviewed and are negative.  EKGs/Labs/Other Studies Reviewed:    The following studies were reviewed today:   EKG:  EKG is  ordered today.  The ekg ordered today is personally reviewed and demonstrates sinus bradycardia 52 bpm incomplete right bundle branch block  Recent Labs: 02/09/2021: ALT 31; Hemoglobin 14.9; Platelets 226 09/03/2021: BNP 38.1; BUN 18; Creatinine, Ser 1.08; Potassium 4.4; Sodium 142  Recent Lipid Panel    Component  Value Date/Time   CHOL 135 02/09/2021 0921   TRIG 141 02/09/2021 0921   HDL 43 02/09/2021 0921   CHOLHDL 3.1 02/09/2021 0921   CHOLHDL 4.1 09/16/2014 1744   VLDL 22 09/16/2014 1744   LDLCALC 67 02/09/2021 0921    Physical Exam:    VS:  BP 140/70 (BP Location: Left Arm, Patient Position: Sitting, Cuff Size: Normal)   Pulse (!) 52   Ht 5\' 11"  (1.803 m)   Wt (!) 306 lb (138.8 kg)   SpO2 91%   BMI 42.68 kg/m     Wt Readings from Last 3 Encounters:  11/03/21 (!) 306 lb (138.8 kg)  09/03/21 (!) 311 lb 6.4 oz (141.3 kg)  02/09/21 (!) 317 lb 9.6 oz (144.1 kg)     GEN: Appears his age well nourished, well developed in no acute distress HEENT: Normal NECK: No JVD; No carotid bruits LYMPHATICS: No lymphadenopathy CARDIAC: Distant heart sounds RRR, no murmurs, rubs, gallops RESPIRATORY:  Clear to auscultation without rales, wheezing or rhonchi  ABDOMEN: Soft, non-tender, non-distended MUSCULOSKELETAL:  No edema; No deformity  SKIN: Warm and dry NEUROLOGIC:  Alert and oriented x 3 PSYCHIATRIC:  Normal affect     Signed, Norman Herrlich, MD  11/03/2021 10:51 AM     [facility]

## 2021-11-03 ENCOUNTER — Ambulatory Visit (INDEPENDENT_AMBULATORY_CARE_PROVIDER_SITE_OTHER): Payer: Medicaid Other | Admitting: Cardiology

## 2021-11-03 ENCOUNTER — Other Ambulatory Visit: Payer: Self-pay

## 2021-11-03 ENCOUNTER — Encounter: Payer: Self-pay | Admitting: Cardiology

## 2021-11-03 ENCOUNTER — Other Ambulatory Visit: Payer: Self-pay | Admitting: Pharmacist

## 2021-11-03 VITALS — BP 140/70 | HR 52 | Ht 71.0 in | Wt 306.0 lb

## 2021-11-03 DIAGNOSIS — E119 Type 2 diabetes mellitus without complications: Secondary | ICD-10-CM | POA: Diagnosis not present

## 2021-11-03 DIAGNOSIS — I10 Essential (primary) hypertension: Secondary | ICD-10-CM

## 2021-11-03 DIAGNOSIS — E782 Mixed hyperlipidemia: Secondary | ICD-10-CM

## 2021-11-03 DIAGNOSIS — R072 Precordial pain: Secondary | ICD-10-CM

## 2021-11-03 DIAGNOSIS — E1169 Type 2 diabetes mellitus with other specified complication: Secondary | ICD-10-CM

## 2021-11-03 DIAGNOSIS — R079 Chest pain, unspecified: Secondary | ICD-10-CM

## 2021-11-03 DIAGNOSIS — I739 Peripheral vascular disease, unspecified: Secondary | ICD-10-CM

## 2021-11-03 MED ORDER — METOPROLOL TARTRATE 25 MG PO TABS
25.0000 mg | ORAL_TABLET | Freq: Once | ORAL | 0 refills | Status: DC
  Filled 2021-11-03: qty 1, 1d supply, fill #0

## 2021-11-03 MED ORDER — LISINOPRIL 20 MG PO TABS
20.0000 mg | ORAL_TABLET | Freq: Every day | ORAL | 3 refills | Status: DC
  Filled 2021-11-03: qty 30, 30d supply, fill #0
  Filled 2021-11-23: qty 90, 90d supply, fill #1

## 2021-11-03 MED ORDER — METFORMIN HCL ER 500 MG PO TB24
500.0000 mg | ORAL_TABLET | Freq: Every day | ORAL | 3 refills | Status: DC
  Filled 2021-11-03: qty 30, 30d supply, fill #0
  Filled 2022-01-22: qty 30, 30d supply, fill #1
  Filled 2022-03-16: qty 30, 30d supply, fill #2
  Filled 2022-05-03: qty 30, 30d supply, fill #3

## 2021-11-03 NOTE — Addendum Note (Signed)
Addended by: Roosvelt Harps R on: 11/03/2021 01:03 PM   Modules accepted: Orders

## 2021-11-03 NOTE — Patient Instructions (Signed)
Medication Instructions:  Your physician recommends that you continue on your current medications as directed. Please refer to the Current Medication list given to you today.  *If you need a refill on your cardiac medications before your next appointment, please call your pharmacy*   Lab Work:  Labs 1 week before CT: BMP  If you have labs (blood work) drawn today and your tests are completely normal, you will receive your results only by: MyChart Message (if you have MyChart) OR A paper copy in the mail If you have any lab test that is abnormal or we need to change your treatment, we will call you to review the results.   Testing/Procedures:   Your cardiac CT will be scheduled at one of the below locations:   [facility] [address] [place], [place] [address] [phone]  OR  [facility] [address] [place], [place] [address] [phone]  If scheduled at [facility], please arrive at the [facility] (Entrance C2) of [facility] 30 minutes prior to test start time. You can use the FREE valet parking offered at entrance C (encouraged to control the heart rate for the test)  Proceed to the [facility] Radiology Department (first floor) to check-in and test prep.  All radiology patients and guests should use entrance C2 at [facility], accessed from [facility], even though the hospital's physical address listed is [address].    If scheduled at [facility], please arrive 15 mins early for check-in and test prep.  Please follow these instructions carefully (unless otherwise directed):  Hold all erectile dysfunction medications at least 3 days (72 hrs) prior to test.  On the Night Before the Test: Be sure to Drink plenty of water. Do not consume any caffeinated/decaffeinated beverages or chocolate 12 hours prior to  your test. Do not take any antihistamines 12 hours prior to your test.  On the Day of the Test: Drink plenty of water until 1 hour prior to the test. Do not eat any food 4 hours prior to the test. You may take your regular medications prior to the test.  Take metoprolol (Lopressor) two hours prior to test. HOLD Hydrochlorothiazide morning of the test.      After the Test: Drink plenty of water. After receiving IV contrast, you may experience a mild flushed feeling. This is normal. On occasion, you may experience a mild rash up to 24 hours after the test. This is not dangerous. If this occurs, you can take Benadryl 25 mg and increase your fluid intake. If you experience trouble breathing, this can be serious. If it is severe call 911 IMMEDIATELY. If it is mild, please call our office. If you take any of these medications: Glipizide/Metformin, Avandament, Glucavance, please do not take 48 hours after completing test unless otherwise instructed.  We will call to schedule your test 2-4 weeks out understanding that some insurance companies will need an authorization prior to the service being performed.   For non-scheduling related questions, please contact the cardiac imaging nurse navigator should you have any questions/concerns: Rockwell Alexandria, Cardiac Imaging Nurse Navigator Larey Brick, Cardiac Imaging Nurse Navigator [facility] Direct Office Dial: [phone]   For scheduling needs, including cancellations and rescheduling, please call [place], [phone].    Follow-Up: At [facility], you and your health needs are our priority.  As part of our continuing  mission to provide you with exceptional heart care, we have created designated Provider Care Teams.  These Care Teams include your primary Cardiologist (physician) and Advanced Practice Providers (APPs -  Physician Assistants and Nurse Practitioners) who all work together to provide you with the  care you need, when you need it.  We recommend signing up for the patient portal called "MyChart".  Sign up information is provided on this After Visit Summary.  MyChart is used to connect with patients for Virtual Visits (Telemedicine).  Patients are able to view lab/test results, encounter notes, upcoming appointments, etc.  Non-urgent messages can be sent to your provider as well.   To learn more about what you can do with MyChart, go to [email or web address].    Your next appointment:   3 month(s)  The format for your next appointment:   In Person  Provider:   Norman Herrlich, MD{   Other Instructions None  Important Information About Sugar

## 2021-11-03 NOTE — Addendum Note (Signed)
Addended by: Roosvelt Harps R on: 11/03/2021 11:35 AM   Modules accepted: Orders

## 2021-11-19 ENCOUNTER — Encounter (HOSPITAL_COMMUNITY): Payer: Self-pay

## 2021-11-20 ENCOUNTER — Telehealth (HOSPITAL_COMMUNITY): Payer: Self-pay | Admitting: Emergency Medicine

## 2021-11-20 NOTE — Telephone Encounter (Signed)
Attempted to call patient regarding upcoming cardiac CT appointment. °Left message on voicemail with name and callback number °Jenessa Gillingham RN Navigator Cardiac Imaging °[facility] °[phone] Office °[phone] Cell ° °

## 2021-11-23 ENCOUNTER — Other Ambulatory Visit: Payer: Self-pay

## 2021-11-23 ENCOUNTER — Ambulatory Visit (HOSPITAL_COMMUNITY)
Admission: RE | Admit: 2021-11-23 | Discharge: 2021-11-23 | Disposition: A | Discharge status: Home / Self Care | Payer: Medicaid Other | Source: Ambulatory Visit | Attending: Cardiology | Admitting: Cardiology

## 2021-11-23 ENCOUNTER — Other Ambulatory Visit: Payer: Self-pay | Admitting: Cardiovascular Disease

## 2021-11-23 DIAGNOSIS — R072 Precordial pain: Secondary | ICD-10-CM | POA: Insufficient documentation

## 2021-11-23 DIAGNOSIS — I2510 Atherosclerotic heart disease of native coronary artery without angina pectoris: Secondary | ICD-10-CM | POA: Diagnosis not present

## 2021-11-23 MED ORDER — NITROGLYCERIN 0.4 MG SL SUBL
SUBLINGUAL_TABLET | SUBLINGUAL | Status: AC
  Filled 2021-11-23: qty 2

## 2021-11-23 MED ORDER — NITROGLYCERIN 0.4 MG SL SUBL
0.8000 mg | SUBLINGUAL_TABLET | Freq: Once | SUBLINGUAL | Status: AC
  Administered 2021-11-23: 0.8 mg via SUBLINGUAL

## 2021-11-23 MED ORDER — IOHEXOL 350 MG/ML SOLN
95.0000 mL | Freq: Once | INTRAVENOUS | Status: AC | PRN
  Administered 2021-11-23: 95 mL via INTRAVENOUS

## 2021-11-24 ENCOUNTER — Ambulatory Visit (HOSPITAL_COMMUNITY)
Admission: RE | Admit: 2021-11-24 | Discharge: 2021-11-24 | Disposition: A | Discharge status: Home / Self Care | Payer: Medicaid Other | Source: Ambulatory Visit | Attending: Cardiovascular Disease | Admitting: Cardiovascular Disease

## 2021-11-24 ENCOUNTER — Ambulatory Visit (HOSPITAL_BASED_OUTPATIENT_CLINIC_OR_DEPARTMENT_OTHER)
Admission: RE | Admit: 2021-11-24 | Discharge: 2021-11-24 | Disposition: A | Discharge status: Home / Self Care | Payer: Medicaid Other | Source: Ambulatory Visit | Attending: Cardiovascular Disease | Admitting: Cardiovascular Disease

## 2021-11-24 DIAGNOSIS — I2510 Atherosclerotic heart disease of native coronary artery without angina pectoris: Secondary | ICD-10-CM

## 2021-11-24 DIAGNOSIS — R931 Abnormal findings on diagnostic imaging of heart and coronary circulation: Secondary | ICD-10-CM | POA: Diagnosis not present

## 2021-11-27 ENCOUNTER — Other Ambulatory Visit: Payer: Self-pay

## 2021-11-30 ENCOUNTER — Other Ambulatory Visit: Payer: Self-pay

## 2021-12-08 ENCOUNTER — Other Ambulatory Visit: Payer: Self-pay

## 2021-12-08 DIAGNOSIS — E119 Type 2 diabetes mellitus without complications: Secondary | ICD-10-CM | POA: Insufficient documentation

## 2021-12-09 ENCOUNTER — Encounter: Payer: Self-pay | Admitting: Cardiology

## 2021-12-09 ENCOUNTER — Ambulatory Visit: Payer: Medicaid Other | Attending: Cardiology | Admitting: Cardiology

## 2021-12-09 ENCOUNTER — Other Ambulatory Visit: Payer: Self-pay

## 2021-12-09 VITALS — BP 160/68 | HR 48 | Ht 71.0 in | Wt 308.1 lb

## 2021-12-09 DIAGNOSIS — E782 Mixed hyperlipidemia: Secondary | ICD-10-CM

## 2021-12-09 DIAGNOSIS — I10 Essential (primary) hypertension: Secondary | ICD-10-CM

## 2021-12-09 DIAGNOSIS — R0609 Other forms of dyspnea: Secondary | ICD-10-CM

## 2021-12-09 DIAGNOSIS — I2510 Atherosclerotic heart disease of native coronary artery without angina pectoris: Secondary | ICD-10-CM

## 2021-12-09 DIAGNOSIS — E119 Type 2 diabetes mellitus without complications: Secondary | ICD-10-CM

## 2021-12-09 DIAGNOSIS — Z87891 Personal history of nicotine dependence: Secondary | ICD-10-CM | POA: Diagnosis not present

## 2021-12-09 HISTORY — DX: Other forms of dyspnea: R06.09

## 2021-12-09 MED ORDER — NITROGLYCERIN 0.4 MG SL SUBL
0.4000 mg | SUBLINGUAL_TABLET | SUBLINGUAL | 6 refills | Status: DC | PRN
  Filled 2021-12-09: qty 25, 9d supply, fill #0
  Filled 2022-01-22: qty 25, 30d supply, fill #1

## 2021-12-09 NOTE — Progress Notes (Signed)
Cardiology Office Note:    Date:  12/09/2021   ID:  Luis Larson, DOB 1955-11-11, MRN [id number]  PCP:  Marcine Matar, MD  Cardiologist:  Garwin Brothers, MD   Referring MD: Marcine Matar, MD    ASSESSMENT:    1. Essential hypertension   2. Mixed hyperlipidemia   3. Diabetes mellitus without complication (HCC)   4. Former smoker   5. Dyspnea on exertion    PLAN:    In order of problems listed above:  Dyspnea on exertion and intermittent claudication: Abnormal CT coronary angiography: I discussed my findings with the patient.  CT coronary angiography report was discussed.  Significantly abnormal.  He continues to have significant symptoms on minimal exertion.  Following recommendations were made.I discussed coronary angiography and left heart catheterization with the patient at extensive length. Procedure, benefits and potential risks were explained. Patient had multiple questions which were answered to the patient's satisfaction. Patient agreed and consented for the procedure. Further recommendations will be made based on the findings of the coronary angiography. In the interim. The patient has any significant symptoms he knows to go to the nearest emergency room. Essential hypertension: Blood pressure is elevated but it appears to be swollen because of the fact that he is under stress.  He keeps a track of his blood pressures at home and they are fine.  I am not going to make any major changes at this time.  This is the first time of seeing him. Diabetes mellitus and mixed dyslipidemia: Diet was emphasized.  Lifestyle modification urged.  He promises to do better. Morbid obesity: Weight reduction stressed risks of obesity explained.  Patient promises to do lifestyle modification and change this with the bedtime. Intermittent claudication: Currently on medications.  After his coronary angiography.  And the aforementioned he we will evaluate this and necessary. He will be seen in  follow-up appointment after coronary angiography.   Medication Adjustments/Labs and Tests Ordered: Current medicines are reviewed at length with the patient today.  Concerns regarding medicines are outlined above.  No orders of the defined types were placed in this encounter.  No orders of the defined types were placed in this encounter.    No chief complaint on file.    History of Present Illness:    Luis Larson is a 66 y.o. male.  Patient has past medical history of essential hypertension, dyslipidemia, diabetes mellitus and morbid obesity.  He has symptoms suggesting of intermittent claudication.  He underwent CT coronary angiography and this was significantly abnormal.  His symptoms are of significant concern.  He has significant dyspnea on exertion.  At the time of my evaluation, the patient is alert awake oriented and in no distress.  He uses a coated baby aspirin on a daily basis.  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 No pertinent  surgical history.  Current Medications: Current Meds  Medication Sig   albuterol (VENTOLIN HFA) 108 (90 Base) MCG/ACT inhaler Inhale 2 puffs into the lungs every 6 (six) hours as needed for wheezing or shortness of breath.   amLODipine (NORVASC) 10 MG tablet Take 1  tablet (10 mg total) by mouth daily.   aspirin EC 81 MG tablet Take 1 tablet (81 mg total) by mouth daily.   atorvastatin (LIPITOR) 20 MG tablet Take 1 tablet (20 mg total) by mouth daily.   budesonide-formoterol (SYMBICORT) 80-4.5 MCG/ACT inhaler Inhale 2 puffs into the lungs 2 (two) times daily.   cilostazol (PLETAL) 50 MG tablet Take 1 tablet (50 mg total) by mouth 2 (two) times daily. For peripheral vascular disease   cilostazol (PLETAL) 50 MG tablet Take 50 mg by mouth daily.   glimepiride (AMARYL) 4 MG tablet Take 1 tablet (4 mg total) by mouth daily with breakfast.   hydrochlorothiazide (HYDRODIURIL) 25 MG tablet Take 1 tablet (25 mg total) by mouth daily.   lisinopril (ZESTRIL) 20 MG tablet Take 1 tablet (20 mg total) by mouth daily.   metFORMIN (GLUCOPHAGE-XR) 500 MG 24 hr tablet Take 1 tablet (500 mg total) by mouth daily with breakfast.   sildenafil (VIAGRA) 100 MG tablet TAKE 1/2 TO 1 TAB BY MOUTH 1 HOUR PRIOR TO INTERCOURSE AS NEEDED   traZODone (DESYREL) 50 MG tablet TAKE 0.5-1 TABLETS (25-50 MG TOTAL) BY MOUTH AT BEDTIME AS NEEDED FOR SLEEP.     Allergies:   Shellfish allergy   Social History   Socioeconomic History   Marital status: Divorced    Spouse name: Not on file   Number of children: Not on file   Years of education: Not on file   Highest education level: Not on file  Occupational History   Not on file  Tobacco Use   Smoking status: Former    Packs/day: 2.00    Types: Cigarettes    Quit date: 04/2020    Years since quitting: 1.6   Smokeless tobacco: Never  Vaping Use   Vaping Use: Never used  Substance and Sexual Activity   Alcohol use: No   Drug use: No   Sexual activity: Not on file  Other Topics Concern   Not on file  Social History Narrative   Not on file   Social Determinants of Health   Financial Resource Strain: Not on file  Food Insecurity: Not on file  Transportation Needs: Not on file  Physical Activity: Not on file  Stress: Not on  file  Social Connections: Not on file     Family History: The patient's family history includes Diabetes in his mother; Heart disease in his father and mother.  ROS:   Please see the history of present illness.    All other systems reviewed and are negative.  EKGs/Labs/Other Studies Reviewed:    The following studies were reviewed today: I discussed my findings with the patient at length including CT coronary angiography report.  EKG reveals sinus rhythm and nonspecific ST-T changes.   Recent Labs: 02/09/2021: ALT 31; Hemoglobin 14.9; Platelets 226 09/03/2021: BNP 38.1; BUN 18; Creatinine, Ser 1.08; Potassium 4.4; Sodium 142  Recent Lipid Panel    Component Value Date/Time   CHOL 135 02/09/2021 0921   TRIG 141 02/09/2021 0921   HDL 43 02/09/2021 0921   CHOLHDL 3.1 02/09/2021 0921   CHOLHDL 4.1 09/16/2014 1744   VLDL 22 09/16/2014 1744   LDLCALC 67 02/09/2021 0921  Physical Exam:    VS:  BP (!) 160/68   Pulse (!) 48   Ht 5\' 11"  (1.803 m)   Wt (!) 308 lb 1.3 oz (139.7 kg)   SpO2 90%   BMI 42.97 kg/m     Wt Readings from Last 3 Encounters:  12/09/21 (!) 308 lb 1.3 oz (139.7 kg)  11/03/21 (!) 306 lb (138.8 kg)  09/03/21 (!) 311 lb 6.4 oz (141.3 kg)     GEN: Patient is in no acute distress HEENT: Normal NECK: No JVD; No carotid bruits LYMPHATICS: No lymphadenopathy CARDIAC: Hear sounds regular, 2/6 systolic murmur at the apex. RESPIRATORY:  Clear to auscultation without rales, wheezing or rhonchi  ABDOMEN: Soft, non-tender, non-distended MUSCULOSKELETAL:  No edema; No deformity  SKIN: Warm and dry NEUROLOGIC:  Alert and oriented x 3 PSYCHIATRIC:  Normal affect   Signed, Garwin Brothers, MD  12/09/2021 11:54 AM    [facility]

## 2021-12-09 NOTE — Patient Instructions (Signed)
Medication Instructions:  Your physician has recommended you make the following change in your medication:   Take 81 mg coated aspirin daily.  Use nitroglycerin 1 tablet placed under the tongue at the first sign of chest pain or an angina attack. 1 tablet may be used every 5 minutes as needed, for up to 15 minutes. Do not take more than 3 tablets in 15 minutes. If pain persist call 911 or go to the nearest ED.   *If you need a refill on your cardiac medications before your next appointment, please call your pharmacy*   Lab Work: Your physician recommends that you have a BMET and CBC today in the office for your upcoming procedure.  [facility] [address] in [place]. They also close daily for lunch for 12-1.   [facility] [address] 2nd floor M-W 8-11:30 and 1-4:30 and Thursday and Friday 8-11:30.  If you have labs (blood work) drawn today and your tests are completely normal, you will receive your results only by: MyChart Message (if you have MyChart) OR A paper copy in the mail If you have any lab test that is abnormal or we need to change your treatment, we will call you to review the results.   Testing/Procedures:  [facility] A DEPT OF [place]. [facility] A DEPT OF [place]. [facility] [address], [phone] [facility] [place] [address] Dept: [phone] Loc: [phone]  Luis Larson  12/09/2021  You are scheduled for a Cardiac Catheterization on Thursday, September 7 with Dr. Verne Carrow.  1. Please arrive at the [facility] (Main Entrance A) at [facility]: [address] [place], [place] [address] at 5:30 AM (This time is two hours before your procedure to ensure your preparation). Free valet parking service is available.   Special note: Every effort is made to have your procedure done on time. Please understand that emergencies sometimes delay scheduled  procedures.  2. Diet: Do not eat solid foods after midnight.  The patient may have clear liquids until 5am upon the day of the procedure.  3. Labs: You will need to have blood drawn on Thursday, August 31 at [facility]: [address], [place]. You do not need to be fasting.  4. Medication instructions in preparation for your procedure:   Contrast Allergy: No   Stop taking, Lisinopril (Zestril or Prinivil) Thursday, September 7,, HTCZ (Hydrochlorothiazide) Thursday, September 7,   Do not take Diabetes Med Glucophage (Metformin) on the day of the procedure and HOLD 48 HOURS AFTER THE PROCEDURE.  On the morning of your procedure, take your Aspirin and any morning medicines NOT listed above.  You may use sips of water.  5. Plan for one night stay--bring personal belongings. 6. Bring a current list of your medications and current insurance cards. 7. You MUST have a responsible person to drive you home. 8. Someone MUST be with you the first 24 hours after you arrive home or your discharge will be delayed. 9. Please wear clothes that are easy to get on and off and wear slip-on shoes.  Thank you for allowing [place] to care for you!   --  Invasive Cardiovascular services    Follow-Up: At [facility], you and your health needs are our priority.  As part of our continuing mission to provide you with exceptional heart care, we have created designated Provider Care Teams.  These Care Teams include your primary Cardiologist (physician) and Advanced Practice Providers (APPs -  Physician Assistants and Nurse Practitioners) who all work together to provide you with the care you need, when you need it.  We recommend signing up for the patient portal called "MyChart".  Sign up information is provided on this After Visit Summary.  MyChart is used to connect with patients for Virtual Visits (Telemedicine).  Patients are able to view lab/test results, encounter notes,  upcoming appointments, etc.  Non-urgent messages can be sent to your provider as well.   To learn more about what you can do with MyChart, go to [email or web address].    Your next appointment:   1 month(s)  The format for your next appointment:   In Person  Provider:   Belva Crome, MD   Other Instructions  Coronary Angiogram With Stent Coronary angiogram with stent placement is a procedure to widen or open a narrow blood vessel of the heart (coronary artery). Arteries may become blocked by cholesterol buildup (plaques) in the lining of the artery wall. When a coronary artery becomes partially blocked, blood flow to that area decreases. This may lead to chest pain or a heart attack (myocardial infarction). A stent is a small piece of metal that looks like mesh or spring. Stent placement may be done as treatment after a heart attack, or to prevent a heart attack if a blocked artery is found by a coronary angiogram. Let your health care provider know about: Any allergies you have, including allergies to medicines or contrast dye. All medicines you are taking, including vitamins, herbs, eye drops, creams, and over-the-counter medicines. Any problems you or family members have had with anesthetic medicines. Any blood disorders you have. Any surgeries you have had. Any medical conditions you have, including kidney problems or kidney failure. Whether you are pregnant or may be pregnant. Whether you are breastfeeding. What are the risks? Generally, this is a safe procedure. However, serious problems may occur, including: Damage to nearby structures or organs, such as the heart, blood vessels, or kidneys. A return of blockage. Bleeding, infection, or bruising at the insertion site. A collection of blood under the skin (hematoma) at the insertion site. A blood clot in another part of the body. Allergic reaction to medicines or dyes. Bleeding into the abdomen (retroperitoneal  bleeding). Stroke (rare). Heart attack (rare). What happens before the procedure? Staying hydrated Follow instructions from your health care provider about hydration, which may include: Up to 2 hours before the procedure - you may continue to drink clear liquids, such as water, clear fruit juice, black coffee, and plain tea.    Eating and drinking restrictions Follow instructions from your health care provider about eating and drinking, which may include: 8 hours before the procedure - stop eating heavy meals or foods, such as meat, fried foods, or fatty foods. 6 hours before the procedure - stop eating light meals or foods, such as toast or cereal. 2 hours before the procedure - stop drinking clear liquids. Medicines Ask your health care provider about: Changing or stopping your regular medicines. This is especially important if you are taking diabetes medicines or blood thinners. Taking medicines such as aspirin and ibuprofen. These medicines can thin your blood. Do not take these medicines unless your health care provider tells you to take them. Generally, aspirin is recommended before a thin tube, called a catheter, is passed through a blood vessel and inserted into the heart (cardiac catheterization). Taking over-the-counter medicines,  vitamins, herbs, and supplements. General instructions Do not use any products that contain nicotine or tobacco for at least 4 weeks before the procedure. These products include cigarettes, e-cigarettes, and chewing tobacco. If you need help quitting, ask your health care provider. Plan to have someone take you home from the hospital or clinic. If you will be going home right after the procedure, plan to have someone with you for 24 hours. You may have tests and imaging procedures. Ask your health care provider: How your insertion site will be marked. Ask which artery will be used for the procedure. What steps will be taken to help prevent infection.  These may include: Removing hair at the insertion site. Washing skin with a germ-killing soap. Taking antibiotic medicine. What happens during the procedure? An IV will be inserted into one of your veins. Electrodes may be placed on your chest to monitor your heart rate during the procedure. You will be given one or more of the following: A medicine to help you relax (sedative). A medicine to numb the area (local anesthetic) for catheter insertion. A small incision will be made for catheter insertion. The catheter will be inserted into an artery using a guide wire. The location may be in your groin, your wrist, or the fold of your arm (near your elbow). An X-ray procedure (fluoroscopy) will be used to help guide the catheter to the opening of the heart arteries. A dye will be injected into the catheter. X-rays will be taken. The dye helps to show where any narrowing or blockages are located in the arteries. Tell your health care provider if you have chest pain or trouble breathing. A tiny wire will be guided to the blocked spot, and a balloon will be inflated to make the artery wider. The stent will be expanded to crush the plaques into the wall of the vessel. The stent will hold the area open and improve the blood flow. Most stents have a drug coating to reduce the risk of the stent narrowing over time. The artery may be made wider using a drill, laser, or other tools that remove plaques. The catheter will be removed when the blood flow improves. The stent will stay where it was placed, and the lining of the artery will grow over it. A bandage (dressing) will be placed on the insertion site. Pressure will be applied to stop bleeding. The IV will be removed. This procedure may vary among health care providers and hospitals.    What happens after the procedure? Your blood pressure, heart rate, breathing rate, and blood oxygen level will be monitored until you leave the hospital or clinic. If  the procedure is done through the leg, you will lie flat in bed for a few hours or for as long as told by your health care provider. You will be instructed not to bend or cross your legs. The insertion site and the pulse in your foot or wrist will be checked often. You may have more blood tests, X-rays, and a test that records the electrical activity of your heart (electrocardiogram, or ECG). Do not drive for 24 hours if you were given a sedative during your procedure. Summary Coronary angiogram with stent placement is a procedure to widen or open a narrowed coronary artery. This is done to treat heart problems. Before the procedure, let your health care provider know about all the medical conditions and surgeries you have or have had. This is a safe procedure. However, some problems may  occur, including damage to nearby structures or organs, bleeding, blood clots, or allergies. Follow your health care provider's instructions about eating, drinking, medicines, and other lifestyle changes, such as quitting tobacco use before the procedure. This information is not intended to replace advice given to you by your health care provider. Make sure you discuss any questions you have with your health care provider. Document Revised: 10/18/2018 Document Reviewed: 10/18/2018 Elsevier Patient Education  2021 Elsevier Inc.  Aspirin and Your Heart Aspirin is a medicine that prevents the platelets in your blood from sticking together. Platelets are the cells that your blood uses for clotting. Aspirin can be used to help reduce the risk of blood clots, heart attacks, and other heart-related problems. What are the risks? Daily use of aspirin can cause side effects. Some of these include: Bleeding. Bleeding can be minor or serious. An example of minor bleeding is bleeding from a cut, and the bleeding does not stop. An example of more serious bleeding is stomach bleeding or, rarely, bleeding into the brain. Your risk  of bleeding increases if you are also taking NSAIDs, such as ibuprofen. Increased bruising. Upset stomach. An allergic reaction. People who have growths inside the nose (nasal polyps) have an increased risk of developing an aspirin allergy. How to use aspirin to care for your heart Take aspirin only as told by your health care provider. Make sure that you understand how much to take and what form to take. The two forms of aspirin are: Non-enteric-coated.This type of aspirin does not have a coating and is absorbed quickly. This type of aspirin also comes in a chewable form. Enteric-coated. This type of aspirin has a coating that releases the medicine very slowly. Enteric-coated aspirin might cause less stomach upset than non-enteric-coated aspirin. This type of aspirin should not be chewed or crushed. Work with your health care provider to find out whether it is safe and beneficial for you to take aspirin daily. Taking aspirin daily may be helpful if: You have had a heart attack or chest pain, or you are at risk for a heart attack. You have a condition in which certain heart vessels are blocked (coronary artery disease), and you have had a procedure to treat it. Examples are: Open-heart surgery, such as coronary artery bypass surgery (CABG). Coronary angioplasty,which is done to widen a blood vessel of your heart. Having a small mesh tube, or stent, placed in your coronary artery. You have had certain types of stroke or a mini-stroke known as a transient ischemic attack (TIA). You have a narrowing of the arteries that supply the limbs (peripheral artery disease, or PAD). You have long-term (chronic) heart rhythm problems, such as atrial fibrillation, and your health care provider thinks aspirin may help. You have valve disease or have had surgery on a valve. You are considered at increased risk of developing coronary artery disease or PAD.    Follow these instructions at home Medicines Take  over-the-counter and prescription medicines only as told by your health care provider. If you are taking blood thinners: Talk with your health care provider before you take any medicines that contain aspirin or NSAIDs, such as ibuprofen. These medicines increase your risk for dangerous bleeding. Take your medicine exactly as told, at the same time every day. Avoid activities that could cause injury or bruising, and follow instructions about how to prevent falls. Wear a medical alert bracelet or carry a card that lists what medicines you take. General instructions Do not drink alcohol  if: Your health care provider tells you not to drink. You are pregnant, may be pregnant, or are planning to become pregnant. If you drink alcohol: Limit how much you use to: 0-1 drink a day for women. 0-2 drinks a day for men. Be aware of how much alcohol is in your drink. In the [place] one drink equals one 12 oz bottle of beer (355 mL), one 5 oz glass of wine (148 mL), or one 1 oz glass of hard liquor (44 mL). Keep all follow-up visits as told by your health care provider. This is important. Where to find more information The American Heart Association: www.heart.org Contact a health care provider if you have: Unusual bleeding or bruising. Stomach pain or nausea. Ringing in your ears. An allergic reaction that causes hives, itchy skin, or swelling of the lips, tongue, or face. Get help right away if: You notice that your bowel movements are bloody, or dark red or black in color. You vomit or cough up blood. You have blood in your urine. You cough, breathe loudly (wheeze), or feel short of breath. You have chest pain, especially if the pain spreads to your arms, back, neck, or jaw. You have a headache with confusion. You have any symptoms of a stroke. "BE FAST" is an easy way to remember the main warning signs of a stroke: B - Balance. Signs are dizziness, sudden trouble walking, or loss of balance. E -  Eyes. Signs are trouble seeing or a sudden change in vision. F - Face. Signs are sudden weakness or numbness of the face, or the face or eyelid drooping on one side. A - Arms. Signs are weakness or numbness in an arm. This happens suddenly and usually on one side of the body. S - Speech. Signs are sudden trouble speaking, slurred speech, or trouble understanding what people say. T - Time. Time to call emergency services. Write down what time symptoms started. You have other signs of a stroke, such as: A sudden, severe headache with no known cause. Nausea or vomiting. Seizure. These symptoms may represent a serious problem that is an emergency. Do not wait to see if the symptoms will go away. Get medical help right away. Call your local emergency services (911 in the [place] Do not drive yourself to the hospital. Summary Aspirin use can help reduce the risk of blood clots, heart attacks, and other heart-related problems. Daily use of aspirin can cause side effects. Take aspirin only as told by your health care provider. Make sure that you understand how much to take and what form to take. Your health care provider will help you determine whether it is safe and beneficial for you to take aspirin daily. This information is not intended to replace advice given to you by your health care provider. Make sure you discuss any questions you have with your health care provider. Document Revised: 01/01/2019 Document Reviewed: 01/01/2019 Elsevier Patient Education  2021 Elsevier Inc. Nitroglycerin sublingual tablets What is this medicine? NITROGLYCERIN (nye troe GLI ser in) is a type of vasodilator. It relaxes blood vessels, increasing the blood and oxygen supply to your heart. This medicine is used to relieve chest pain caused by angina. It is also used to prevent chest pain before activities like climbing stairs, going outdoors in cold weather, or sexual activity. This medicine may be used for other  purposes; ask your health care provider or pharmacist if you have questions. COMMON BRAND NAME(S): Nitroquick, Nitrostat, Nitrotab What should I tell  my health care provider before I take this medicine? They need to know if you have any of these conditions: anemia head injury, recent stroke, or bleeding in the brain liver disease previous heart attack an unusual or allergic reaction to nitroglycerin, other medicines, foods, dyes, or preservatives pregnant or trying to get pregnant breast-feeding How should I use this medicine? Take this medicine by mouth as needed. Use at the first sign of an angina attack (chest pain or tightness). You can also take this medicine 5 to 10 minutes before an event likely to produce chest pain. Follow the directions exactly as written on the prescription label. Place one tablet under your tongue and let it dissolve. Do not swallow whole. Replace the dose if you accidentally swallow it. It will help if your mouth is not dry. Saliva around the tablet will help it to dissolve more quickly. Do not eat or drink, smoke or chew tobacco while a tablet is dissolving. Sit down when taking this medicine. In an angina attack, you should feel better within 5 minutes after your first dose. You can take a dose every 5 minutes up to a total of 3 doses. If you do not feel better or feel worse after 1 dose, call 9-1-1 at once. Do not take more than 3 doses in 15 minutes. Your health care provider might give you other directions. Follow those directions if he or she does. Do not take your medicine more often than directed. Talk to your health care provider about the use of this medicine in children. Special care may be needed. Overdosage: If you think you have taken too much of this medicine contact a poison control center or emergency room at once. NOTE: This medicine is only for you. Do not share this medicine with others. What if I miss a dose? This does not apply. This medicine is  only used as needed. What may interact with this medicine? Do not take this medicine with any of the following medications: certain migraine medicines like ergotamine and dihydroergotamine (DHE) medicines used to treat erectile dysfunction like sildenafil, tadalafil, and vardenafil riociguat This medicine may also interact with the following medications: alteplase aspirin heparin medicines for high blood pressure medicines for mental depression other medicines used to treat angina phenothiazines like chlorpromazine, mesoridazine, prochlorperazine, thioridazine This list may not describe all possible interactions. Give your health care provider a list of all the medicines, herbs, non-prescription drugs, or dietary supplements you use. Also tell them if you smoke, drink alcohol, or use illegal drugs. Some items may interact with your medicine. What should I watch for while using this medicine? Tell your doctor or health care professional if you feel your medicine is no longer working. Keep this medicine with you at all times. Sit or lie down when you take your medicine to prevent falling if you feel dizzy or faint after using it. Try to remain calm. This will help you to feel better faster. If you feel dizzy, take several deep breaths and lie down with your feet propped up, or bend forward with your head resting between your knees. You may get drowsy or dizzy. Do not drive, use machinery, or do anything that needs mental alertness until you know how this drug affects you. Do not stand or sit up quickly, especially if you are an older patient. This reduces the risk of dizzy or fainting spells. Alcohol can make you more drowsy and dizzy. Avoid alcoholic drinks. Do not treat yourself for coughs,  colds, or pain while you are taking this medicine without asking your doctor or health care professional for advice. Some ingredients may increase your blood pressure. What side effects may I notice from  receiving this medicine? Side effects that you should report to your doctor or health care professional as soon as possible: allergic reactions (skin rash, itching or hives; swelling of the face, lips, or tongue) low blood pressure (dizziness; feeling faint or lightheaded, falls; unusually weak or tired) low red blood cell counts (trouble breathing; feeling faint; lightheaded, falls; unusually weak or tired) Side effects that usually do not require medical attention (report to your doctor or health care professional if they continue or are bothersome): facial flushing (redness) headache nausea, vomiting This list may not describe all possible side effects. Call your doctor for medical advice about side effects. You may report side effects to FDA at 1-800-FDA-1088. Where should I keep my medicine? Keep out of the reach of children. Store at room temperature between 20 and 25 degrees C (68 and 77 degrees F). Store in Retail buyer. Protect from light and moisture. Keep tightly closed. Throw away any unused medicine after the expiration date. NOTE: This sheet is a summary. It may not cover all possible information. If you have questions about this medicine, talk to your doctor, pharmacist, or health care provider.  2021 Elsevier/Gold Standard (2017-12-28 16:46:32)

## 2021-12-09 NOTE — H&P (View-Only) (Signed)
Cardiology Office Note:    Date:  12/09/2021   ID:  Luis Larson, DOB 1955-11-11, MRN [id number]  PCP:  Marcine Matar, MD  Cardiologist:  Garwin Brothers, MD   Referring MD: Marcine Matar, MD    ASSESSMENT:    1. Essential hypertension   2. Mixed hyperlipidemia   3. Diabetes mellitus without complication (HCC)   4. Former smoker   5. Dyspnea on exertion    PLAN:    In order of problems listed above:  Dyspnea on exertion and intermittent claudication: Abnormal CT coronary angiography: I discussed my findings with the patient.  CT coronary angiography report was discussed.  Significantly abnormal.  He continues to have significant symptoms on minimal exertion.  Following recommendations were made.I discussed coronary angiography and left heart catheterization with the patient at extensive length. Procedure, benefits and potential risks were explained. Patient had multiple questions which were answered to the patient's satisfaction. Patient agreed and consented for the procedure. Further recommendations will be made based on the findings of the coronary angiography. In the interim. The patient has any significant symptoms he knows to go to the nearest emergency room. Essential hypertension: Blood pressure is elevated but it appears to be swollen because of the fact that he is under stress.  He keeps a track of his blood pressures at home and they are fine.  I am not going to make any major changes at this time.  This is the first time of seeing him. Diabetes mellitus and mixed dyslipidemia: Diet was emphasized.  Lifestyle modification urged.  He promises to do better. Morbid obesity: Weight reduction stressed risks of obesity explained.  Patient promises to do lifestyle modification and change this with the bedtime. Intermittent claudication: Currently on medications.  After his coronary angiography.  And the aforementioned he we will evaluate this and necessary. He will be seen in  follow-up appointment after coronary angiography.   Medication Adjustments/Labs and Tests Ordered: Current medicines are reviewed at length with the patient today.  Concerns regarding medicines are outlined above.  No orders of the defined types were placed in this encounter.  No orders of the defined types were placed in this encounter.    No chief complaint on file.    History of Present Illness:    Luis Larson is a 66 y.o. male.  Patient has past medical history of essential hypertension, dyslipidemia, diabetes mellitus and morbid obesity.  He has symptoms suggesting of intermittent claudication.  He underwent CT coronary angiography and this was significantly abnormal.  His symptoms are of significant concern.  He has significant dyspnea on exertion.  At the time of my evaluation, the patient is alert awake oriented and in no distress.  He uses a coated baby aspirin on a daily basis.  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 No pertinent  surgical history.  Current Medications: Current Meds  Medication Sig   albuterol (VENTOLIN HFA) 108 (90 Base) MCG/ACT inhaler Inhale 2 puffs into the lungs every 6 (six) hours as needed for wheezing or shortness of breath.   amLODipine (NORVASC) 10 MG tablet Take 1  tablet (10 mg total) by mouth daily.   aspirin EC 81 MG tablet Take 1 tablet (81 mg total) by mouth daily.   atorvastatin (LIPITOR) 20 MG tablet Take 1 tablet (20 mg total) by mouth daily.   budesonide-formoterol (SYMBICORT) 80-4.5 MCG/ACT inhaler Inhale 2 puffs into the lungs 2 (two) times daily.   cilostazol (PLETAL) 50 MG tablet Take 1 tablet (50 mg total) by mouth 2 (two) times daily. For peripheral vascular disease   cilostazol (PLETAL) 50 MG tablet Take 50 mg by mouth daily.   glimepiride (AMARYL) 4 MG tablet Take 1 tablet (4 mg total) by mouth daily with breakfast.   hydrochlorothiazide (HYDRODIURIL) 25 MG tablet Take 1 tablet (25 mg total) by mouth daily.   lisinopril (ZESTRIL) 20 MG tablet Take 1 tablet (20 mg total) by mouth daily.   metFORMIN (GLUCOPHAGE-XR) 500 MG 24 hr tablet Take 1 tablet (500 mg total) by mouth daily with breakfast.   sildenafil (VIAGRA) 100 MG tablet TAKE 1/2 TO 1 TAB BY MOUTH 1 HOUR PRIOR TO INTERCOURSE AS NEEDED   traZODone (DESYREL) 50 MG tablet TAKE 0.5-1 TABLETS (25-50 MG TOTAL) BY MOUTH AT BEDTIME AS NEEDED FOR SLEEP.     Allergies:   Shellfish allergy   Social History   Socioeconomic History   Marital status: Divorced    Spouse name: Not on file   Number of children: Not on file   Years of education: Not on file   Highest education level: Not on file  Occupational History   Not on file  Tobacco Use   Smoking status: Former    Packs/day: 2.00    Types: Cigarettes    Quit date: 04/2020    Years since quitting: 1.6   Smokeless tobacco: Never  Vaping Use   Vaping Use: Never used  Substance and Sexual Activity   Alcohol use: No   Drug use: No   Sexual activity: Not on file  Other Topics Concern   Not on file  Social History Narrative   Not on file   Social Determinants of Health   Financial Resource Strain: Not on file  Food Insecurity: Not on file  Transportation Needs: Not on file  Physical Activity: Not on file  Stress: Not on  file  Social Connections: Not on file     Family History: The patient's family history includes Diabetes in his mother; Heart disease in his father and mother.  ROS:   Please see the history of present illness.    All other systems reviewed and are negative.  EKGs/Labs/Other Studies Reviewed:    The following studies were reviewed today: I discussed my findings with the patient at length including CT coronary angiography report.  EKG reveals sinus rhythm and nonspecific ST-T changes.   Recent Labs: 02/09/2021: ALT 31; Hemoglobin 14.9; Platelets 226 09/03/2021: BNP 38.1; BUN 18; Creatinine, Ser 1.08; Potassium 4.4; Sodium 142  Recent Lipid Panel    Component Value Date/Time   CHOL 135 02/09/2021 0921   TRIG 141 02/09/2021 0921   HDL 43 02/09/2021 0921   CHOLHDL 3.1 02/09/2021 0921   CHOLHDL 4.1 09/16/2014 1744   VLDL 22 09/16/2014 1744   LDLCALC 67 02/09/2021 0921  Physical Exam:    VS:  BP (!) 160/68   Pulse (!) 48   Ht 5\' 11"  (1.803 m)   Wt (!) 308 lb 1.3 oz (139.7 kg)   SpO2 90%   BMI 42.97 kg/m     Wt Readings from Last 3 Encounters:  12/09/21 (!) 308 lb 1.3 oz (139.7 kg)  11/03/21 (!) 306 lb (138.8 kg)  09/03/21 (!) 311 lb 6.4 oz (141.3 kg)     GEN: Patient is in no acute distress HEENT: Normal NECK: No JVD; No carotid bruits LYMPHATICS: No lymphadenopathy CARDIAC: Hear sounds regular, 2/6 systolic murmur at the apex. RESPIRATORY:  Clear to auscultation without rales, wheezing or rhonchi  ABDOMEN: Soft, non-tender, non-distended MUSCULOSKELETAL:  No edema; No deformity  SKIN: Warm and dry NEUROLOGIC:  Alert and oriented x 3 PSYCHIATRIC:  Normal affect   Signed, Garwin Brothers, MD  12/09/2021 11:54 AM    [facility]

## 2021-12-10 ENCOUNTER — Other Ambulatory Visit: Payer: Self-pay

## 2021-12-11 LAB — CBC
Hematocrit: 42.8 % (ref 37.5–51.0)
Hemoglobin: 14.2 g/dL (ref 13.0–17.7)
MCH: 28.6 pg (ref 26.6–33.0)
MCHC: 33.2 g/dL (ref 31.5–35.7)
MCV: 86 fL (ref 79–97)
Platelets: 218 10*3/uL (ref 150–450)
RBC: 4.97 x10E6/uL (ref 4.14–5.80)
RDW: 14.2 % (ref 11.6–15.4)
WBC: 8.1 10*3/uL (ref 3.4–10.8)

## 2021-12-11 LAB — BASIC METABOLIC PANEL
BUN/Creatinine Ratio: 15 (ref 10–24)
BUN: 16 mg/dL (ref 8–27)
CO2: 25 mmol/L (ref 20–29)
Calcium: 9.5 mg/dL (ref 8.6–10.2)
Chloride: 103 mmol/L (ref 96–106)
Creatinine, Ser: 1.08 mg/dL (ref 0.76–1.27)
Glucose: 149 mg/dL — ABNORMAL HIGH (ref 70–99)
Potassium: 4.6 mmol/L (ref 3.5–5.2)
Sodium: 138 mmol/L (ref 134–144)
eGFR: 77 mL/min/{1.73_m2} (ref >59)

## 2021-12-15 ENCOUNTER — Telehealth: Payer: Self-pay | Admitting: *Deleted

## 2021-12-15 NOTE — Telephone Encounter (Signed)
Cardiac Catheterization scheduled at [facility] for: Thursday December 17, 2021 7:30 AM Arrival time and place: [facility] Main Entrance A at: 5:30 AM  Nothing to eat after midnight prior to procedure, clear liquids until 5 AM day of procedure.  CONTRAST ALLERGY: no  Medication instructions: -Hold:  Metformin-day of procedure and 48 hours post procedure  Glimepiride-AM of procedure  HCTZ-AM of procedure -Except hold medications usual morning medications can be taken with sips of water including aspirin 81 mg.  Confirmed patient has responsible adult to drive home post procedure and be with patient first 24 hours after arriving home.  Patient reports no new symptoms concerning for COVID-19 in the past 10 days.  Reviewed procedure instructions with patient.

## 2021-12-17 ENCOUNTER — Other Ambulatory Visit (HOSPITAL_COMMUNITY): Payer: Self-pay

## 2021-12-17 ENCOUNTER — Other Ambulatory Visit: Payer: Self-pay

## 2021-12-17 ENCOUNTER — Encounter (HOSPITAL_COMMUNITY)
Admission: RE | Disposition: A | Discharge status: Home / Self Care | Payer: Self-pay | Source: Home / Self Care | Attending: Cardiovascular Disease

## 2021-12-17 ENCOUNTER — Telehealth (HOSPITAL_COMMUNITY): Payer: Self-pay | Admitting: Pharmacy Technician

## 2021-12-17 ENCOUNTER — Ambulatory Visit (HOSPITAL_COMMUNITY)
Admission: RE | Admit: 2021-12-17 | Discharge: 2021-12-17 | Disposition: A | Discharge status: Home / Self Care | Payer: Medicaid Other | Attending: Cardiovascular Disease | Admitting: Cardiovascular Disease

## 2021-12-17 DIAGNOSIS — Z87891 Personal history of nicotine dependence: Secondary | ICD-10-CM

## 2021-12-17 DIAGNOSIS — E785 Hyperlipidemia, unspecified: Secondary | ICD-10-CM | POA: Diagnosis present

## 2021-12-17 DIAGNOSIS — Z955 Presence of coronary angioplasty implant and graft: Secondary | ICD-10-CM | POA: Diagnosis not present

## 2021-12-17 DIAGNOSIS — I25110 Atherosclerotic heart disease of native coronary artery with unstable angina pectoris: Secondary | ICD-10-CM | POA: Diagnosis not present

## 2021-12-17 DIAGNOSIS — R0609 Other forms of dyspnea: Secondary | ICD-10-CM

## 2021-12-17 DIAGNOSIS — E1151 Type 2 diabetes mellitus with diabetic peripheral angiopathy without gangrene: Secondary | ICD-10-CM | POA: Diagnosis not present

## 2021-12-17 DIAGNOSIS — E782 Mixed hyperlipidemia: Secondary | ICD-10-CM

## 2021-12-17 DIAGNOSIS — Z7982 Long term (current) use of aspirin: Secondary | ICD-10-CM | POA: Insufficient documentation

## 2021-12-17 DIAGNOSIS — Z7984 Long term (current) use of oral hypoglycemic drugs: Secondary | ICD-10-CM | POA: Insufficient documentation

## 2021-12-17 DIAGNOSIS — I200 Unstable angina: Secondary | ICD-10-CM

## 2021-12-17 DIAGNOSIS — I10 Essential (primary) hypertension: Secondary | ICD-10-CM | POA: Diagnosis not present

## 2021-12-17 DIAGNOSIS — Z6841 Body Mass Index (BMI) 40.0 and over, adult: Secondary | ICD-10-CM | POA: Diagnosis not present

## 2021-12-17 DIAGNOSIS — E119 Type 2 diabetes mellitus without complications: Secondary | ICD-10-CM

## 2021-12-17 HISTORY — PX: CORONARY PRESSURE/FFR STUDY: CATH118243

## 2021-12-17 HISTORY — PX: CORONARY STENT INTERVENTION: CATH118234

## 2021-12-17 HISTORY — PX: LEFT HEART CATH AND CORONARY ANGIOGRAPHY: CATH118249

## 2021-12-17 HISTORY — PX: INTRAVASCULAR PRESSURE WIRE/FFR STUDY: CATH118243

## 2021-12-17 LAB — GLUCOSE, CAPILLARY
Glucose-Capillary: 147 mg/dL — ABNORMAL HIGH (ref 70–99)
Glucose-Capillary: 147 mg/dL — ABNORMAL HIGH (ref 70–99)

## 2021-12-17 LAB — POCT ACTIVATED CLOTTING TIME: Activated Clotting Time: 317 seconds

## 2021-12-17 SURGERY — LEFT HEART CATH AND CORONARY ANGIOGRAPHY
Anesthesia: LOCAL

## 2021-12-17 MED ORDER — ASPIRIN 81 MG PO CHEW: 81.0000 mg | CHEWABLE_TABLET | ORAL | Status: DC

## 2021-12-17 MED ORDER — SODIUM CHLORIDE 0.9 % IV SOLN
250.0000 mL | INTRAVENOUS | Status: DC | PRN
Start: 2021-12-17 — End: 2021-12-17

## 2021-12-17 MED ORDER — MIDAZOLAM HCL 2 MG/2ML IJ SOLN
INTRAMUSCULAR | Status: DC | PRN
  Administered 2021-12-17 (×2): 1 mg via INTRAVENOUS

## 2021-12-17 MED ORDER — ONDANSETRON HCL 4 MG/2ML IJ SOLN: 4.0000 mg | Freq: Four times a day (QID) | INTRAMUSCULAR | Status: DC | PRN

## 2021-12-17 MED ORDER — VERAPAMIL HCL 2.5 MG/ML IV SOLN
INTRAVENOUS | Status: AC
Start: 2021-12-17 — End: ?
  Filled 2021-12-17: qty 2

## 2021-12-17 MED ORDER — SODIUM CHLORIDE 0.9 % IV SOLN: INTRAVENOUS | Status: AC

## 2021-12-17 MED ORDER — HEPARIN SODIUM (PORCINE) 1000 UNIT/ML IJ SOLN
INTRAMUSCULAR | Status: AC
Start: 2021-12-17 — End: ?
  Filled 2021-12-17: qty 10

## 2021-12-17 MED ORDER — SODIUM CHLORIDE 0.9% FLUSH
3.0000 mL | INTRAVENOUS | Status: DC | PRN
Start: 2021-12-17 — End: 2021-12-17

## 2021-12-17 MED ORDER — SODIUM CHLORIDE 0.9 % IV SOLN: 250.0000 mL | INTRAVENOUS | Status: DC | PRN

## 2021-12-17 MED ORDER — VERAPAMIL HCL 2.5 MG/ML IV SOLN
INTRAVENOUS | Status: DC | PRN
  Administered 2021-12-17: 10 mL via INTRA_ARTERIAL

## 2021-12-17 MED ORDER — CLOPIDOGREL BISULFATE 300 MG PO TABS
ORAL_TABLET | ORAL | Status: DC | PRN
  Administered 2021-12-17: 600 mg via ORAL

## 2021-12-17 MED ORDER — ACETAMINOPHEN 325 MG PO TABS: 650.0000 mg | ORAL_TABLET | ORAL | Status: DC | PRN

## 2021-12-17 MED ORDER — FENTANYL CITRATE (PF) 100 MCG/2ML IJ SOLN
INTRAMUSCULAR | Status: AC
  Filled 2021-12-17: qty 2

## 2021-12-17 MED ORDER — NITROGLYCERIN 1 MG/10 ML FOR IR/CATH LAB
INTRA_ARTERIAL | Status: AC
  Filled 2021-12-17: qty 10

## 2021-12-17 MED ORDER — HEPARIN (PORCINE) IN NACL 1000-0.9 UT/500ML-% IV SOLN
INTRAVENOUS | Status: DC | PRN
  Administered 2021-12-17 (×2): 500 mL

## 2021-12-17 MED ORDER — ROSUVASTATIN CALCIUM 40 MG PO TABS
40.0000 mg | ORAL_TABLET | Freq: Every day | ORAL | 3 refills | Status: DC
  Filled 2021-12-17: qty 90, 90d supply, fill #0
  Filled 2022-03-18: qty 90, 90d supply, fill #1

## 2021-12-17 MED ORDER — SODIUM CHLORIDE 0.9 % WEIGHT BASED INFUSION: 1.0000 mL/kg/h | INTRAVENOUS | Status: DC

## 2021-12-17 MED ORDER — NITROGLYCERIN 1 MG/10 ML FOR IR/CATH LAB
INTRA_ARTERIAL | Status: DC | PRN
  Administered 2021-12-17: 200 ug via INTRACORONARY

## 2021-12-17 MED ORDER — CLOPIDOGREL BISULFATE 75 MG PO TABS: 75.0000 mg | ORAL_TABLET | Freq: Every day | ORAL | Status: DC

## 2021-12-17 MED ORDER — CLOPIDOGREL BISULFATE 300 MG PO TABS
ORAL_TABLET | ORAL | Status: AC
  Filled 2021-12-17: qty 2

## 2021-12-17 MED ORDER — LIDOCAINE HCL (PF) 1 % IJ SOLN
INTRAMUSCULAR | Status: DC | PRN
  Administered 2021-12-17: 2 mL

## 2021-12-17 MED ORDER — FAMOTIDINE IN NACL 20-0.9 MG/50ML-% IV SOLN
INTRAVENOUS | Status: DC | PRN
  Administered 2021-12-17: 20 mg via INTRAVENOUS

## 2021-12-17 MED ORDER — LIDOCAINE HCL (PF) 1 % IJ SOLN
INTRAMUSCULAR | Status: AC
  Filled 2021-12-17: qty 30

## 2021-12-17 MED ORDER — FENTANYL CITRATE (PF) 100 MCG/2ML IJ SOLN
INTRAMUSCULAR | Status: DC | PRN
  Administered 2021-12-17 (×2): 25 ug via INTRAVENOUS

## 2021-12-17 MED ORDER — HYDRALAZINE HCL 20 MG/ML IJ SOLN: 10.0000 mg | INTRAMUSCULAR | Status: AC | PRN

## 2021-12-17 MED ORDER — SODIUM CHLORIDE 0.9% FLUSH: 3.0000 mL | Freq: Two times a day (BID) | INTRAVENOUS | Status: DC

## 2021-12-17 MED ORDER — HEPARIN SODIUM (PORCINE) 1000 UNIT/ML IJ SOLN
INTRAMUSCULAR | Status: DC | PRN
  Administered 2021-12-17: 7000 [IU] via INTRAVENOUS
  Administered 2021-12-17: 6000 [IU] via INTRAVENOUS

## 2021-12-17 MED ORDER — LABETALOL HCL 5 MG/ML IV SOLN: 10.0000 mg | INTRAVENOUS | Status: AC | PRN

## 2021-12-17 MED ORDER — HEPARIN (PORCINE) IN NACL 1000-0.9 UT/500ML-% IV SOLN
INTRAVENOUS | Status: AC
Start: 2021-12-17 — End: ?
  Filled 2021-12-17: qty 1000

## 2021-12-17 MED ORDER — MIDAZOLAM HCL 2 MG/2ML IJ SOLN
INTRAMUSCULAR | Status: AC
  Filled 2021-12-17: qty 2

## 2021-12-17 MED ORDER — CLOPIDOGREL BISULFATE 75 MG PO TABS
75.0000 mg | ORAL_TABLET | Freq: Every day | ORAL | 3 refills | Status: DC
  Filled 2021-12-17: qty 90, 90d supply, fill #0
  Filled 2022-01-22: qty 90, 90d supply, fill #1

## 2021-12-17 MED ORDER — FAMOTIDINE IN NACL 20-0.9 MG/50ML-% IV SOLN
INTRAVENOUS | Status: AC
Start: 2021-12-17 — End: ?
  Filled 2021-12-17: qty 50

## 2021-12-17 MED ORDER — SODIUM CHLORIDE 0.9% FLUSH: 3.0000 mL | INTRAVENOUS | Status: DC | PRN

## 2021-12-17 MED ORDER — SODIUM CHLORIDE 0.9 % WEIGHT BASED INFUSION
3.0000 mL/kg/h | INTRAVENOUS | Status: AC
  Administered 2021-12-17: 3 mL/kg/h via INTRAVENOUS

## 2021-12-17 MED ORDER — IOHEXOL 350 MG/ML SOLN
INTRAVENOUS | Status: DC | PRN
  Administered 2021-12-17: 140 mL

## 2021-12-17 SURGICAL SUPPLY — 19 items
BALL SAPPHIRE NC24 3.25X22 (BALLOONS) ×1
BALLN SAPPHIRE 2.0X12 (BALLOONS) ×1
BALLOON SAPPHIRE 2.0X12 (BALLOONS) IMPLANT
BALLOON SAPPHIRE NC24 3.25X22 (BALLOONS) IMPLANT
CATH 5FR JL3.5 JR4 ANG PIG MP (CATHETERS) IMPLANT
CATH VISTA GUIDE 6FR XB3.5 (CATHETERS) IMPLANT
DEVICE RAD COMP TR BAND LRG (VASCULAR PRODUCTS) IMPLANT
ELECT DEFIB PAD ADLT CADENCE (PAD) IMPLANT
GLIDESHEATH SLEND SS 6F .021 (SHEATH) IMPLANT
GUIDEWIRE INQWIRE 1.5J.035X260 (WIRE) IMPLANT
GUIDEWIRE PRESSURE X 175 (WIRE) IMPLANT
INQWIRE 1.5J .035X260CM (WIRE) ×1
KIT ENCORE 26 ADVANTAGE (KITS) IMPLANT
KIT HEART LEFT (KITS) ×1 IMPLANT
PACK CARDIAC CATHETERIZATION (CUSTOM PROCEDURE TRAY) ×1 IMPLANT
STENT SYNERGY XD 3.0X32 (Permanent Stent) IMPLANT
TRANSDUCER W/STOPCOCK (MISCELLANEOUS) ×1 IMPLANT
TUBING CIL FLEX 10 FLL-RA (TUBING) ×1 IMPLANT
WIRE COUGAR XT STRL 190CM (WIRE) IMPLANT

## 2021-12-17 NOTE — Interval H&P Note (Signed)
History and Physical Interval Note:  12/17/2021 7:09 AM  Luis Larson  has presented today for surgery, with the diagnosis of abnormal cardiac ct.  The various methods of treatment have been discussed with the patient and family. After consideration of risks, benefits and other options for treatment, the patient has consented to  Procedure(s): LEFT HEART CATH AND CORONARY ANGIOGRAPHY (N/A) as a surgical intervention.  The patient's history has been reviewed, patient examined, no change in status, stable for surgery.  I have reviewed the patient's chart and labs.  Questions were answered to the patient's satisfaction.    Cath Lab Visit (complete for each Cath Lab visit)  Clinical Evaluation Leading to the Procedure:   ACS: No.  Non-ACS:    Anginal Classification: CCS III  Anti-ischemic medical therapy: Minimal Therapy (1 class of medications)  Non-Invasive Test Results: High-risk stress test findings: cardiac mortality >3%/year (Coronary CTA with evidence of severe LAD stenosis)  Prior CABG: No previous CABG        Verne Carrow

## 2021-12-17 NOTE — Progress Notes (Signed)
Patient was sent home with plavix and rosuvastatin

## 2021-12-17 NOTE — Telephone Encounter (Signed)
Patient Advocate Encounter  Prior Authorization for Farxiga 10 mg has been approved.    PA# [id number] Conf# [id number] W Effective dates: 12/17/2021 through 12/17/2022      Roland Earl, CPhT Pharmacy Patient Advocate Specialist [facility] Patient Advocate Team Direct Number: [phone]  Fax: [phone]

## 2021-12-17 NOTE — Discharge Summary (Addendum)
Discharge Summary for Same Day PCI   Patient ID: Luis Larson MRN: [id number]; DOB: 02/13/1956  Admit date: 12/17/2021 Discharge date: 12/17/2021  Primary Care Provider: Marcine Matar, MD  Primary Cardiologist: Garwin Brothers, MD  Primary Electrophysiologist:  None   Discharge Diagnoses    Principal Problem:   Unstable angina [facility]) Active Problems:   Essential hypertension   Hyperlipidemia  Diagnostic Studies/Procedures    Cardiac Catheterization 12/17/2021:    Prox RCA lesion is 30% stenosed.   Dist RCA lesion is 30% stenosed.   Mid Cx lesion is 50% stenosed.   Mid LAD lesion is 80% stenosed.   A drug-eluting stent was successfully placed using a STENT SYNERGY XD 3.0X32.   Post intervention, there is a 0% residual stenosis.   The left ventricular systolic function is normal.   LV end diastolic pressure is normal.   The left ventricular ejection fraction is 55-65% by visual estimate.   Severe mid LAD stenosis. RFR 0.87 beyond the mid LAD stenosis Successful PTCA/DES x 1 mid LAD Moderate non-obstructive disease in the mid Circumflex Mild non-obstructive disease in the mid and distal RCA Preserved LV systolic function   Recommendations: Same day post PCI discharge. Will continue ASA and Plavix for at least six months. Will continue Pletal for now but if any evidence of bleeding, will need to hold Pletal while he is on Plavix.   Diagnostic Dominance: Right  Intervention   _____________   History of Present Illness     Luis Larson is a 66 y.o. male with past medical history of hypertension, hyperlipidemia, diabetes and morbid obesity.  Followed by Dr. Tomie China as an outpatient.  He underwent outpatient coronary CT angiography with a coronary calcium score of 631, 88th percentile.  Diffuse mild to moderate plaque in the RCA with severe stenosis in the mid LAD and mild circumflex disease.  He was seen back in the office on 8/30 to discuss cardiac catheterization.  He  was placed on aspirin.   Cardiac catheterization was arranged for further evaluation.  Hospital Course     The patient underwent cardiac cath as noted above with severe mid LAD stenosis with RFR of 0.87 treated with PCI/DES x1.  Moderate nonobstructive disease in the mid circumflex and mild nonobstructive disease in the mid to distal RCA with recommendations for medical management. Plan for DAPT with ASA/Plavix for at least 6 months.  Plan to continue Pletal for now but if any evidence of bleeding would stop while he is on Plavix. The patient was seen by cardiac rehab while in short stay. There were no observed complications post cath. Radial cath site was re-evaluated prior to discharge and found to be stable without any complications. Instructions/precautions regarding cath site care were given prior to discharge.  Lyonel Blane was seen by Dr. Clifton James and determined stable for discharge home. Follow up with our office has been arranged. Medications are listed below. Pertinent changes include addition of Plavix, Crestor.  _____________  Cath/PCI Registry Performance & Quality Measures: Aspirin prescribed? - Yes ADP Receptor Inhibitor (Plavix/Clopidogrel, Brilinta/Ticagrelor or Effient/Prasugrel) prescribed (includes medically managed patients)? - Yes High Intensity Statin (Lipitor 40-80mg  or Crestor 20-40mg ) prescribed? - Yes For EF <40%, was ACEI/ARB prescribed? - Not Applicable (EF >/= 40%) For EF <40%, Aldosterone Antagonist (Spironolactone or Eplerenone) prescribed? - Not Applicable (EF >/= 40%) Cardiac Rehab Phase II ordered (Included Medically managed Patients)? - Yes  _____________   Discharge Vitals Blood pressure (!) 175/92, pulse (!) 55, temperature  97.8 F (36.6 C), temperature source Temporal, resp. rate 18, height 5\' 11"  (1.803 m), weight 136.1 kg, SpO2 92 %.  Filed Weights   12/17/21 0551  Weight: 136.1 kg    Last Labs & Radiologic Studies    CBC No results for  input(s): "WBC", "NEUTROABS", "HGB", "HCT", "MCV", "PLT" in the last 72 hours. Basic Metabolic Panel No results for input(s): "NA", "K", "CL", "CO2", "GLUCOSE", "BUN", "CREATININE", "CALCIUM", "MG", "PHOS" in the last 72 hours. Liver Function Tests No results for input(s): "AST", "ALT", "ALKPHOS", "BILITOT", "PROT", "ALBUMIN" in the last 72 hours. No results for input(s): "LIPASE", "AMYLASE" in the last 72 hours. High Sensitivity Troponin:   No results for input(s): "TROPONINIHS" in the last 720 hours.  BNP Invalid input(s): "POCBNP" D-Dimer No results for input(s): "DDIMER" in the last 72 hours. Hemoglobin A1C No results for input(s): "HGBA1C" in the last 72 hours. Fasting Lipid Panel No results for input(s): "CHOL", "HDL", "LDLCALC", "TRIG", "CHOLHDL", "LDLDIRECT" in the last 72 hours. Thyroid Function Tests No results for input(s): "TSH", "T4TOTAL", "T3FREE", "THYROIDAB" in the last 72 hours.  Invalid input(s): "FREET3" _____________  CARDIAC CATHETERIZATION  Result Date: 12/17/2021   Prox RCA lesion is 30% stenosed.   Dist RCA lesion is 30% stenosed.   Mid Cx lesion is 50% stenosed.   Mid LAD lesion is 80% stenosed.   A drug-eluting stent was successfully placed using a STENT SYNERGY XD 3.0X32.   Post intervention, there is a 0% residual stenosis.   The left ventricular systolic function is normal.   LV end diastolic pressure is normal.   The left ventricular ejection fraction is 55-65% by visual estimate. Severe mid LAD stenosis. RFR 0.87 beyond the mid LAD stenosis Successful PTCA/DES x 1 mid LAD Moderate non-obstructive disease in the mid Circumflex Mild non-obstructive disease in the mid and distal RCA Preserved LV systolic function Recommendations: Same day post PCI discharge. Will continue ASA and Plavix for at least six months. Will continue Pletal for now but if any evidence of bleeding, will need to hold Pletal while he is on Plavix.   CT CORONARY MORPH W/CTA COR W/SCORE W/CA  W/CM &/OR WO/CM  Addendum Date: 11/24/2021   ADDENDUM REPORT: 11/24/2021 09:36 EXAM: OVER-READ INTERPRETATION  CT CHEST The following report is an over-read performed by radiologist Dr. Marinda Elk [facility] on 11/24/2021. This over-read does not include interpretation of cardiac or coronary anatomy or pathology. The coronary CTA interpretation by the cardiologist is attached. COMPARISON:  None. FINDINGS: No significant noncardiac vascular findings. Visualized mediastinum and hilar regions demonstrate no lymphadenopathy or masses. Visualized lungs show no evidence of pulmonary edema, consolidation, pneumothorax, nodule or pleural fluid. Visualized upper abdomen and bony structures are unremarkable. IMPRESSION: No significant incidental findings. Electronically Signed   By: Irish Lack M.D.   On: 11/24/2021 09:36   Result Date: 11/24/2021 CLINICAL DATA:  79M with hypertension, hyperlipidemia, diabetes, PAD, tobacco abuse and chest pain. EXAM: Cardiac/Coronary  CT TECHNIQUE: The patient was scanned on a [employer]. FINDINGS: A 120 kV prospective scan was triggered in the descending thoracic aorta at 111 HU's. Axial non-contrast 3 mm slices were carried out through the heart. The data set was analyzed on a dedicated work station and scored using the Agatson method. Gantry rotation speed was 250 msecs and collimation was .6 mm. No beta blockade and 0.8 mg of sl NTG was given. The 3D data set was reconstructed in 5% intervals of the 67-82 % of the  R-R cycle. Diastolic phases were analyzed on a dedicated work station using MPR, MIP and VRT modes. The patient received 80 cc of contrast. Aorta: Normal size. Ascending aorta 3.5 cm. Aortic atherosclerosis. No dissection. Aortic Valve:  Trileaflet.  Mild calcification. Coronary Arteries:  Normal coronary origin.  Right dominance. RCA is a large dominant artery that gives rise to PDA and PLVB. There is diffuse mild-moderate calcified  plaque. Left main is a large artery that gives rise to LAD and LCX arteries. LAD is a large vessel that has mild (25-49%) calcified plaque proximally. There is severe (>70%) mixed plaque in the mid LAD. D1 has minimal (<25%) mixed plaque. LCX is a non-dominant artery that gives rise to one large OM1 branch. There is severe (>70%) mixed plaque in the mid LCX and minimal (<25%) plaque distally. Coronary Calcium Score: Left main: 6.04 Left anterior descending artery: 231 Left circumflex artery: 89.7 Right coronary artery: 304 Total: 631 Percentile: Other findings: Normal pulmonary vein drainage into the left atrium. Normal let atrial appendage without a thrombus. Normal size of the pulmonary artery. IMPRESSION: 1. Coronary calcium score of 631. This was 88th percentile for age-, race-, and sex-matched controls. 2. Normal coronary origin with right dominance. 3. There is diffuse mild-moderate plaque in the RCA. There is severe (>70%) stenosis in the mid LAD and mild LCX arteries. CAD RADS 4. 4. Aortic atherosclerosis. 5. Will send for FFRct. Chilton Si, MD Electronically Signed: By: Chilton Si M.D. On: 11/23/2021 18:19   CT CORONARY FRACTIONAL FLOW RESERVE DATA PREP  Result Date: 11/23/2021 EXAM: CT FFR ANALYSIS CLINICAL DATA:  Abnormal coronary CT-A. FINDINGS: FFRct analysis was performed on the original cardiac CT angiogram dataset. Diagrammatic representation of the FFRct analysis is provided in a separate PDF document in PACS. This dictation was created using the PDF document and an interactive 3D model of the results. 3D model is not available in the EMR/PACS. Normal FFR range is >0.80. 1. Left Main: FFRct 0.99.  No significant stenosis. 2. LAD: FFRct 0.97 proximal, 0.74 mid, 0.69 distal. Significant stenosis. 3. LCX: FFRct 0.98 proximal, 0.74 mid, 0.68 distal. Significant stenosis. 4. RCA: FFRct 0.98 proximal, 0.9 mid, 0.88 distal. No significant stenosis. IMPRESSION: 1. CT FFR analysis  demonstrates significant stenosis in the mid LAD and left circumflex arteries. 2.  Recommend cardiac catheterization. Tiffany C. Duke Salvia, MD Electronically Signed   By: Chilton Si M.D.   On: 11/23/2021 18:23    Disposition   Pt is being discharged home today in good condition.  Follow-up Plans & Appointments     Follow-up Information     Revankar, Aundra Dubin, MD Follow up on 01/19/2022.   Specialty: Cardiology Why: at 10am for your follow up appt Contact information: [address] [place] [address] [phone]                Discharge Instructions     Amb Referral to Cardiac Rehabilitation   Complete by: As directed    Diagnosis: Coronary Stents   After initial evaluation and assessments completed: Virtual Based Care may be provided alone or in conjunction with Phase 2 Cardiac Rehab based on patient barriers.: Yes   Intensive Cardiac Rehabilitation (ICR) [facility] location only OR Traditional Cardiac Rehabilitation (TCR) *If criteria for ICR are not met will enroll in TCR [facility] only): Yes        Discharge Medications   Allergies as of 12/17/2021       Reactions   Shellfish Allergy Swelling  Medication List     STOP taking these medications    atorvastatin 20 MG tablet Commonly known as: LIPITOR   ibuprofen 200 MG tablet Commonly known as: ADVIL       TAKE these medications    albuterol 108 (90 Base) MCG/ACT inhaler Commonly known as: VENTOLIN HFA Inhale 2 puffs into the lungs every 6 (six) hours as needed for wheezing or shortness of breath.   amLODipine 10 MG tablet Commonly known as: NORVASC Take 1 tablet (10 mg total) by mouth daily.   aspirin EC 81 MG tablet Take 1 tablet (81 mg total) by mouth daily.   budesonide-formoterol 80-4.5 MCG/ACT inhaler Commonly known as: SYMBICORT Inhale 2 puffs into the lungs 2 (two) times daily. What changed:  when to take this reasons to take this   cilostazol 50 MG tablet Commonly known as:  PLETAL Take 1 tablet (50 mg total) by mouth 2 (two) times daily. For peripheral vascular disease   clopidogrel 75 MG tablet Commonly known as: Plavix Take 1 tablet (75 mg total) by mouth daily.   glimepiride 4 MG tablet Commonly known as: AMARYL Take 1 tablet (4 mg total) by mouth daily with breakfast.   hydrochlorothiazide 25 MG tablet Commonly known as: HYDRODIURIL Take 1 tablet (25 mg total) by mouth daily.   lisinopril 20 MG tablet Commonly known as: ZESTRIL Take 1 tablet (20 mg total) by mouth daily.   metFORMIN 500 MG 24 hr tablet Commonly known as: GLUCOPHAGE-XR Take 1 tablet (500 mg total) by mouth daily with breakfast.   nitroGLYCERIN 0.4 MG SL tablet Commonly known as: NITROSTAT Place 1 tablet (0.4 mg total) under the tongue every 5 (five) minutes as needed.   rosuvastatin 40 MG tablet Commonly known as: Crestor Take 1 tablet (40 mg total) by mouth daily.   sildenafil 100 MG tablet Commonly known as: VIAGRA TAKE 1/2 TO 1 TAB BY MOUTH 1 HOUR PRIOR TO INTERCOURSE AS NEEDED   traZODone 50 MG tablet Commonly known as: DESYREL TAKE 0.5-1 TABLETS (25-50 MG TOTAL) BY MOUTH AT BEDTIME AS NEEDED FOR SLEEP.           Allergies Allergies  Allergen Reactions   Shellfish Allergy Swelling    Outstanding Labs/Studies   FLP/LFTs in 8 weeks  Duration of Discharge Encounter   Greater than 30 minutes including physician time.  Signed, Laverda Page, NP 12/17/2021, 2:00 PM

## 2021-12-17 NOTE — Progress Notes (Signed)
CARDIAC REHAB PHASE I   Stent education completed with pt. Pt educated on importance of ASA and Plavix. Pt given stent card along with heart healthy and diabetic diets. Reviewed site care, restrictions, and exercise guidelines. Will refer to CRP II [place].   [id number] Reynold Bowen, RN BSN 12/17/2021 11:53 AM

## 2021-12-17 NOTE — Discharge Instructions (Signed)
HOLD METFORMIN FOR A FULL 48 HOURS AFTER DISCHARGE. 

## 2021-12-18 ENCOUNTER — Encounter (HOSPITAL_COMMUNITY): Payer: Self-pay | Admitting: Cardiovascular Disease

## 2021-12-21 IMAGING — DX DG SHOULDER 2+V*R*
3 series · 3 of 3 positions shown · non-contrast
Comparison: None.

CLINICAL DATA: Chronic right shoulder pain

EXAM:
RIGHT SHOULDER - 2+ VIEW

[shoulder grashey]
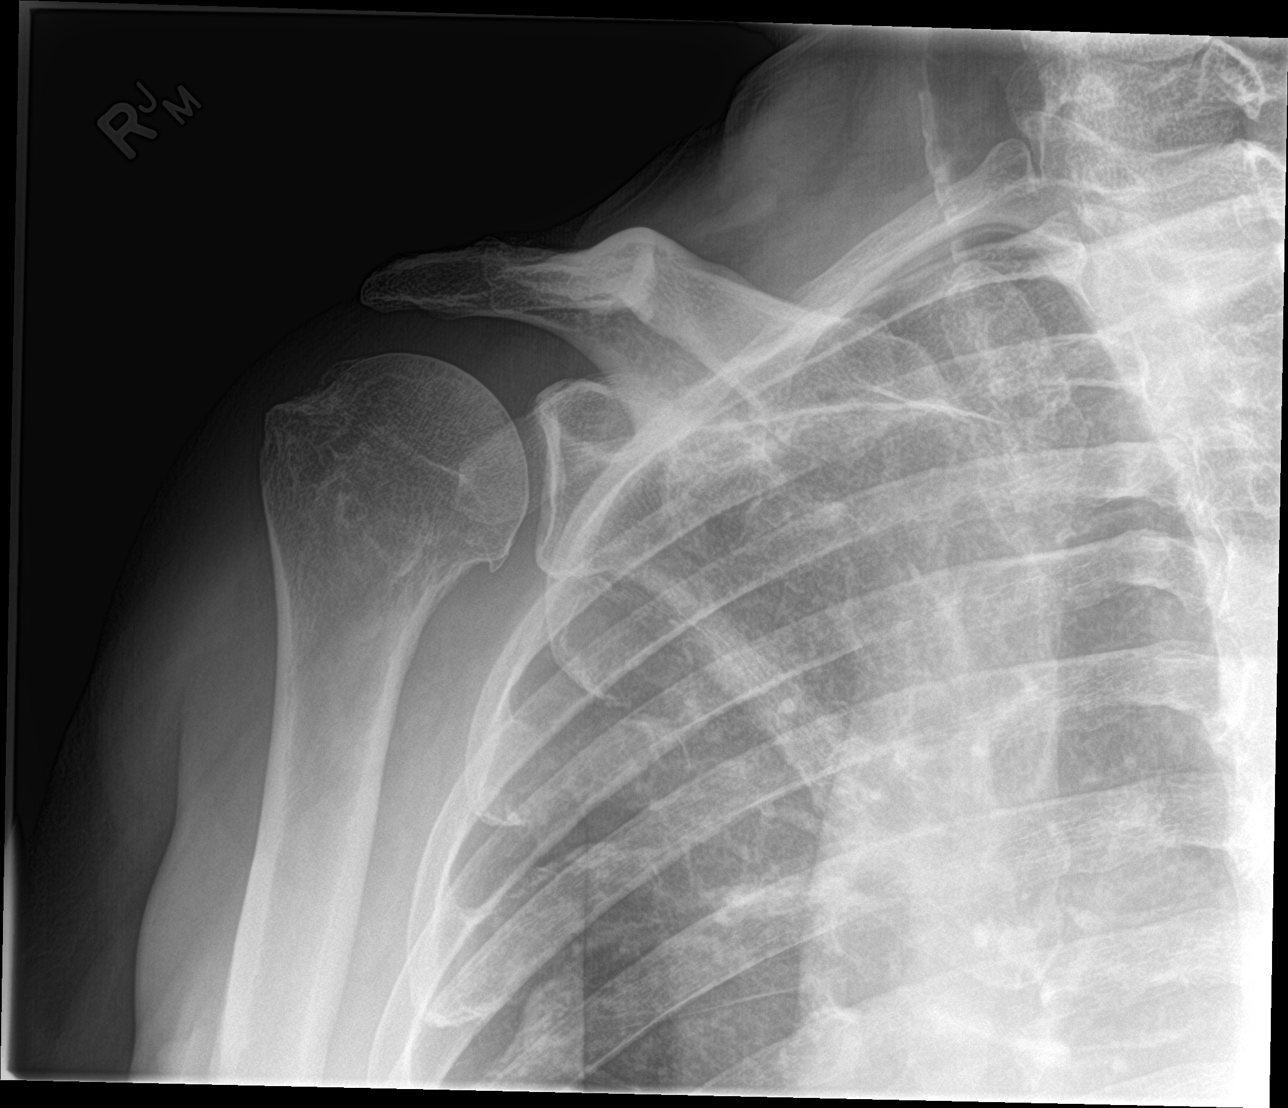

[shoulder y view]
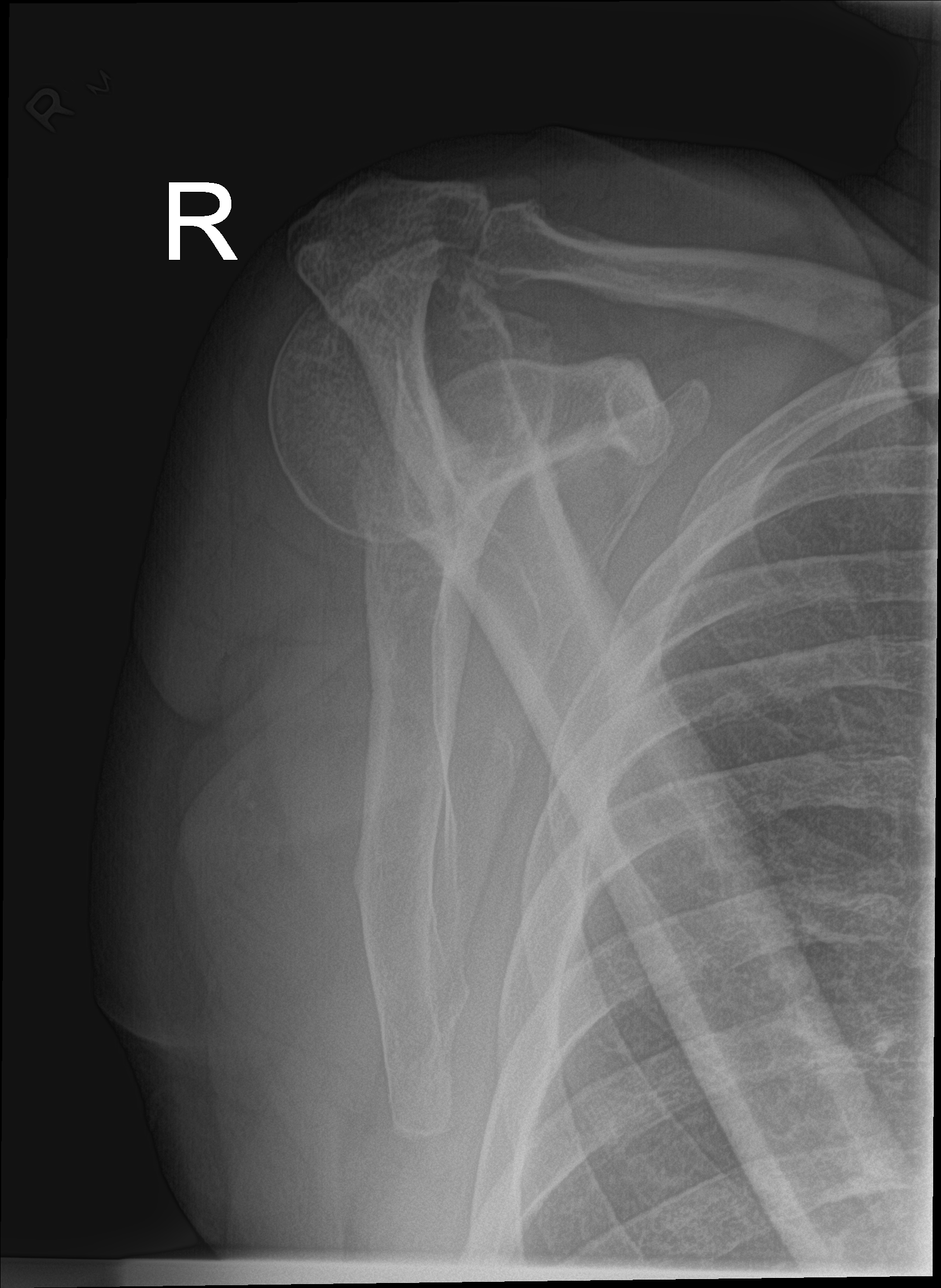

[shoulder axillary]
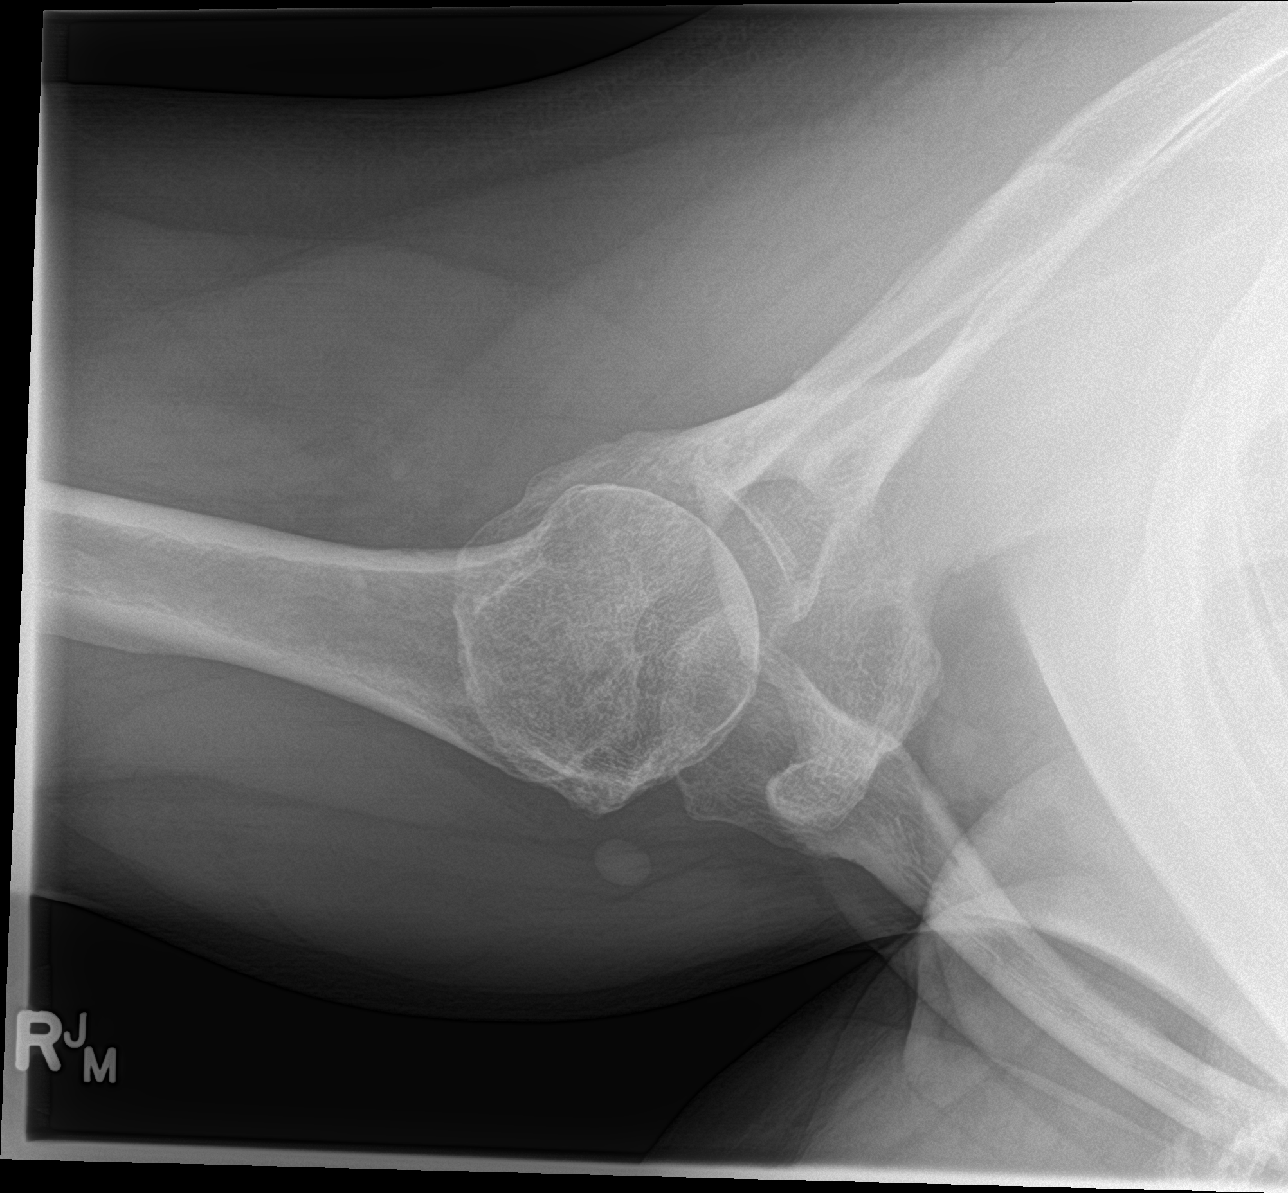

[3 of 3 positions shown; findings below may reference images not displayed]

FINDINGS: There is no evidence of fracture or dislocation. Mild degenerative
changes of the glenohumeral and acromioclavicular joints. Soft
tissues are unremarkable.
IMPRESSION: Mild degenerative changes of the right shoulder

## 2021-12-23 ENCOUNTER — Telehealth (HOSPITAL_COMMUNITY): Payer: Self-pay | Admitting: *Deleted

## 2021-12-23 NOTE — Telephone Encounter (Signed)
Referral for Cardiac Rehab phase II sent to [facility] per pt request. Karlene Lineman RN, BSN Cardiac and Pulmonary Rehab Nurse Navigator

## 2021-12-28 ENCOUNTER — Other Ambulatory Visit: Payer: Self-pay

## 2022-01-19 ENCOUNTER — Ambulatory Visit: Payer: Medicaid Other | Attending: Cardiology | Admitting: Cardiology

## 2022-01-19 ENCOUNTER — Encounter: Payer: Self-pay | Admitting: Cardiology

## 2022-01-19 VITALS — BP 148/68 | HR 56 | Ht 71.0 in | Wt 306.0 lb

## 2022-01-19 DIAGNOSIS — E119 Type 2 diabetes mellitus without complications: Secondary | ICD-10-CM | POA: Diagnosis not present

## 2022-01-19 DIAGNOSIS — E782 Mixed hyperlipidemia: Secondary | ICD-10-CM

## 2022-01-19 DIAGNOSIS — Z87891 Personal history of nicotine dependence: Secondary | ICD-10-CM

## 2022-01-19 DIAGNOSIS — I10 Essential (primary) hypertension: Secondary | ICD-10-CM

## 2022-01-19 NOTE — Patient Instructions (Signed)
Medication Instructions:  Your physician recommends that you continue on your current medications as directed. Please refer to the Current Medication list given to you today.  *If you need a refill on your cardiac medications before your next appointment, please call your pharmacy*   Lab Work: Your physician recommends that you have labs done in the office today. Your test included  basic metabolic panel, complete blood count, TSH, liver function and lipids.   [place] [address] 2nd floor M-W 8-11:30 and 1-4:30 and Thursday and Friday 8-11:30.  If you have labs (blood work) drawn today and your tests are completely normal, you will receive your results only by: [place] (if you have MyChart) OR A paper copy in the mail If you have any lab test that is abnormal or we need to change your treatment, we will call you to review the results.   Testing/Procedures: None ordered   Follow-Up: At [facility], you and your health needs are our priority.  As part of our continuing mission to provide you with exceptional heart care, we have created designated Provider Care Teams.  These Care Teams include your primary Cardiologist (physician) and Advanced Practice Providers (APPs -  Physician Assistants and Nurse Practitioners) who all work together to provide you with the care you need, when you need it.  We recommend signing up for the patient portal called "MyChart".  Sign up information is provided on this After Visit Summary.  MyChart is used to connect with patients for Virtual Visits (Telemedicine).  Patients are able to view lab/test results, encounter notes, upcoming appointments, etc.  Non-urgent messages can be sent to your provider as well.   To learn more about what you can do with MyChart, go to [email or web address].    Your next appointment:   9 month(s)  The format for your next appointment:   In Person  Provider:   Jyl Heinz, MD   Other  Instructions NA

## 2022-01-19 NOTE — Progress Notes (Signed)
Cardiology Office Note:    Date:  01/19/2022   ID:  Luis Larson, DOB 1955-09-08, MRN [id number]  PCP:  Marcine Matar, MD  Cardiologist:  Garwin Brothers, MD   Referring MD: Marcine Matar, MD    ASSESSMENT:    1. Essential hypertension   2. Mixed hyperlipidemia   3. Diabetes mellitus without complication (HCC)   4. Former smoker   5. Morbid obesity (HCC)    PLAN:    In order of problems listed above:  Coronary artery disease: Secondary prevention stressed with the patient.  Importance of compliance with diet and medication stressed any vocalized understanding.  He was advised to walk at least half an hour a day 5 days a week and he promises to do so. Essential hypertension: Blood pressure stable and diet was emphasized.  His blood pressure readings at home are better. Mixed dyslipidemia: On lipid-lowering medications.  Lipids from last evaluation was reviewed and is fasting and we will get blood work today. Obesity: Weight reduction stressed.  Risks of obesity explained and he promises to do better. Patient will be seen in follow-up appointment in 6 months or earlier if the patient has any concerns    Medication Adjustments/Labs and Tests Ordered: Current medicines are reviewed at length with the patient today.  Concerns regarding medicines are outlined above.  No orders of the defined types were placed in this encounter.  No orders of the defined types were placed in this encounter.    No chief complaint on file.    History of Present Illness:    Luis Larson is a 66 y.o. male.  Patient has past medical history of coronary artery disease, essential hypertension, mixed dyslipidemia and obesity.  He denies any problems at this time and takes care of activities of daily living.  No chest pain orthopnea or PND.  He leads a sedentary lifestyle.  At the time of my evaluation, the patient is alert awake oriented and in no distress.  Past Medical History:  Diagnosis  Date   Chronic pain of both shoulders 03/19/2019   Colon cancer screening 10/04/2013   Diabetes (HCC) 12/22/2012   Diabetes mellitus without complication (HCC)    Dyspnea on exertion 12/09/2021   Essential hypertension 12/22/2012   Essential hypertension, benign 12/22/2012   Former smoker 08/26/2020   Hyperlipidemia 12/16/2017   Influenza vaccine needed 03/13/2020   Insomnia 03/13/2020   Morbid obesity (HCC) 07/24/2019   Neuropathy, arm, 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   Other male erectile dysfunction 10/04/2013   PAD (peripheral artery disease) (HCC) 02/09/2021   Primary osteoarthritis of both shoulders 07/24/2019   Type II or unspecified type diabetes mellitus without mention of complication, uncontrolled 10/17/2012   Unstable angina (HCC)     Past Surgical History:  Procedure Laterality Date   CORONARY STENT INTERVENTION N/A 12/17/2021   Procedure: CORONARY STENT INTERVENTION;  Surgeon: Kathleene Hazel, MD;  [facility];  Service: Cardiovascular;  Laterality: N/A;   INTRAVASCULAR PRESSURE WIRE/FFR STUDY N/A 12/17/2021   Procedure: INTRAVASCULAR PRESSURE WIRE/FFR STUDY;  Surgeon: Kathleene Hazel, MD;  [facility]: [facility];  Service: Cardiovascular;  Laterality: N/A;   LEFT HEART CATH AND CORONARY ANGIOGRAPHY N/A 12/17/2021   Procedure: LEFT HEART CATH AND CORONARY ANGIOGRAPHY;  Surgeon: Kathleene Hazel, MD;  [facility]: [facility];  Service: Cardiovascular;  Laterality: N/A;    Current Medications: Current Meds  Medication Sig   albuterol (VENTOLIN HFA) 108 (90 Base) MCG/ACT inhaler  Inhale 2 puffs into the lungs every 6 (six) hours as needed for wheezing or shortness of breath.   amLODipine (NORVASC) 10 MG tablet Take 1 tablet (10 mg total) by mouth daily.   aspirin EC 81 MG tablet Take 1 tablet (81 mg total) by mouth daily.   budesonide-formoterol (SYMBICORT) 80-4.5 MCG/ACT inhaler Inhale 2 puffs into the lungs 2 (two) times daily.   cilostazol (PLETAL) 50  MG tablet Take 1 tablet (50 mg total) by mouth 2 (two) times daily. For peripheral vascular disease   clopidogrel (PLAVIX) 75 MG tablet Take 1 tablet (75 mg total) by mouth daily.   glimepiride (AMARYL) 4 MG tablet Take 1 tablet (4 mg total) by mouth daily with breakfast.   hydrochlorothiazide (HYDRODIURIL) 25 MG tablet Take 1 tablet (25 mg total) by mouth daily.   lisinopril (ZESTRIL) 20 MG tablet Take 1 tablet (20 mg total) by mouth daily.   metFORMIN (GLUCOPHAGE-XR) 500 MG 24 hr tablet Take 1 tablet (500 mg total) by mouth daily with breakfast.   nitroGLYCERIN (NITROSTAT) 0.4 MG SL tablet Place 1 tablet (0.4 mg total) under the tongue every 5 (five) minutes as needed.   rosuvastatin (CRESTOR) 40 MG tablet Take 1 tablet (40 mg total) by mouth daily.   sildenafil (VIAGRA) 100 MG tablet TAKE 1/2 TO 1 TAB BY MOUTH 1 HOUR PRIOR TO INTERCOURSE AS NEEDED   traZODone (DESYREL) 50 MG tablet TAKE 0.5-1 TABLETS (25-50 MG TOTAL) BY MOUTH AT BEDTIME AS NEEDED FOR SLEEP.     Allergies:   Shellfish allergy   Social History   Socioeconomic History   Marital status: Divorced    Spouse name: Not on file   Number of children: Not on file   Years of education: Not on file   Highest education level: Not on file  Occupational History   Not on file  Tobacco Use   Smoking status: Former    Packs/day: 2.00    Types: Cigarettes    Quit date: 04/2020    Years since quitting: 1.7   Smokeless tobacco: Never  Vaping Use   Vaping Use: Never used  Substance and Sexual Activity   Alcohol use: No   Drug use: No   Sexual activity: Not on file  Other Topics Concern   Not on file  Social History Narrative   Not on file   Social Determinants of Health   Financial Resource Strain: Not on file  Food Insecurity: Not on file  Transportation Needs: Not on file  Physical Activity: Not on file  Stress: Not on file  Social Connections: Not on file     Family History: The patient's family history includes  Diabetes in his mother; Heart disease in his father and mother.  ROS:   Please see the history of present illness.    All other systems reviewed and are negative.  EKGs/Labs/Other Studies Reviewed:    The following studies were reviewed today: CORONARY STENT INTERVENTION  INTRAVASCULAR PRESSURE WIRE/FFR STUDY  LEFT HEART CATH AND CORONARY ANGIOGRAPHY   Conclusion      Prox RCA lesion is 30% stenosed.   Dist RCA lesion is 30% stenosed.   Mid Cx lesion is 50% stenosed.   Mid LAD lesion is 80% stenosed.   A drug-eluting stent was successfully placed using a STENT SYNERGY XD 3.0X32.   Post intervention, there is a 0% residual stenosis.   The left ventricular systolic function is normal.   LV end diastolic pressure is normal.  The left ventricular ejection fraction is 55-65% by visual estimate.   Severe mid LAD stenosis. RFR 0.87 beyond the mid LAD stenosis Successful PTCA/DES x 1 mid LAD Moderate non-obstructive disease in the mid Circumflex Mild non-obstructive disease in the mid and distal RCA Preserved LV systolic function   Recommendations: Same day post PCI discharge. Will continue ASA and Plavix for at least six months. Will continue Pletal for now but if any evidence of bleeding, will need to hold Pletal while he is on Plavix.    Recent Labs: 02/09/2021: ALT 31 09/03/2021: BNP 38.1 12/10/2021: BUN 16; Creatinine, Ser 1.08; Hemoglobin 14.2; Platelets 218; Potassium 4.6; Sodium 138  Recent Lipid Panel    Component Value Date/Time   CHOL 135 02/09/2021 0921   TRIG 141 02/09/2021 0921   HDL 43 02/09/2021 0921   CHOLHDL 3.1 02/09/2021 0921   CHOLHDL 4.1 09/16/2014 1744   VLDL 22 09/16/2014 1744   LDLCALC 67 02/09/2021 0921    Physical Exam:    VS:  BP (!) 148/68   Pulse (!) 56   Ht 5\' 11"  (1.803 m)   Wt (!) 306 lb 0.6 oz (138.8 kg)   SpO2 93%   BMI 42.68 kg/m     Wt Readings from Last 3 Encounters:  01/19/22 (!) 306 lb 0.6 oz (138.8 kg)  12/17/21 300 lb  (136.1 kg)  12/09/21 (!) 308 lb 1.3 oz (139.7 kg)     GEN: Patient is in no acute distress HEENT: Normal NECK: No JVD; No carotid bruits LYMPHATICS: No lymphadenopathy CARDIAC: Hear sounds regular, 2/6 systolic murmur at the apex. RESPIRATORY:  Clear to auscultation without rales, wheezing or rhonchi  ABDOMEN: Soft, non-tender, non-distended MUSCULOSKELETAL:  No edema; No deformity  SKIN: Warm and dry NEUROLOGIC:  Alert and oriented x 3 PSYCHIATRIC:  Normal affect   Signed, Garwin Brothers, MD  01/19/2022 10:14 AM    [facility]

## 2022-01-20 LAB — HEPATIC FUNCTION PANEL
ALT: 19 IU/L (ref 0–44)
AST: 17 IU/L (ref 0–40)
Albumin: 4.6 g/dL (ref 3.9–4.9)
Alkaline Phosphatase: 87 IU/L (ref 44–121)
Bilirubin Total: 0.3 mg/dL (ref 0.0–1.2)
Bilirubin, Direct: 0.13 mg/dL (ref 0.00–0.40)
Total Protein: 6.8 g/dL (ref 6.0–8.5)

## 2022-01-20 LAB — BASIC METABOLIC PANEL
BUN/Creatinine Ratio: 14 (ref 10–24)
BUN: 16 mg/dL (ref 8–27)
CO2: 25 mmol/L (ref 20–29)
Calcium: 9.7 mg/dL (ref 8.6–10.2)
Chloride: 98 mmol/L (ref 96–106)
Creatinine, Ser: 1.14 mg/dL (ref 0.76–1.27)
Glucose: 161 mg/dL — ABNORMAL HIGH (ref 70–99)
Potassium: 4.6 mmol/L (ref 3.5–5.2)
Sodium: 137 mmol/L (ref 134–144)
eGFR: 72 mL/min/{1.73_m2} (ref >59)

## 2022-01-20 LAB — LIPID PANEL
Chol/HDL Ratio: 2.7 ratio (ref 0.0–5.0)
Cholesterol, Total: 112 mg/dL (ref 100–199)
HDL: 41 mg/dL (ref >39)
LDL Chol Calc (NIH): 53 mg/dL (ref 0–99)
Triglycerides: 91 mg/dL (ref 0–149)
VLDL Cholesterol Cal: 18 mg/dL (ref 5–40)

## 2022-01-21 ENCOUNTER — Encounter: Payer: Self-pay | Admitting: Internal Medicine

## 2022-01-21 ENCOUNTER — Ambulatory Visit: Payer: Medicaid Other | Attending: Internal Medicine | Admitting: Internal Medicine

## 2022-01-21 ENCOUNTER — Other Ambulatory Visit: Payer: Self-pay

## 2022-01-21 DIAGNOSIS — E1169 Type 2 diabetes mellitus with other specified complication: Secondary | ICD-10-CM | POA: Diagnosis not present

## 2022-01-21 DIAGNOSIS — Z23 Encounter for immunization: Secondary | ICD-10-CM

## 2022-01-21 DIAGNOSIS — I152 Hypertension secondary to endocrine disorders: Secondary | ICD-10-CM | POA: Diagnosis not present

## 2022-01-21 DIAGNOSIS — E1159 Type 2 diabetes mellitus with other circulatory complications: Secondary | ICD-10-CM | POA: Diagnosis not present

## 2022-01-21 DIAGNOSIS — I2510 Atherosclerotic heart disease of native coronary artery without angina pectoris: Secondary | ICD-10-CM

## 2022-01-21 DIAGNOSIS — Z6841 Body Mass Index (BMI) 40.0 and over, adult: Secondary | ICD-10-CM

## 2022-01-21 HISTORY — DX: Atherosclerotic heart disease of native coronary artery without angina pectoris: I25.10

## 2022-01-21 LAB — POCT GLYCOSYLATED HEMOGLOBIN (HGB A1C): HbA1c, POC (controlled diabetic range): 7.9 % — AB (ref 0.0–7.0)

## 2022-01-21 MED ORDER — EMPAGLIFLOZIN 10 MG PO TABS
10.0000 mg | ORAL_TABLET | Freq: Every day | ORAL | 5 refills | Status: DC
  Filled 2022-01-21 – 2022-01-22 (×2): qty 30, 30d supply, fill #0

## 2022-01-21 NOTE — Patient Instructions (Signed)
Your diabetes is not at goal.  We have added the medication called Jardiance 10 mg daily.  Please stay hydrated while on this medication.

## 2022-01-21 NOTE — Progress Notes (Signed)
Patient ID: Luis Larson, male    DOB: [birth year]  MRN: [id number]  CC: Chronic disease management  Subjective: Luis Larson is a 66 y.o. male who presents for chronic ds management His concerns today include:  Pt with hx of HTN, DM, HL, former tob dep (quit 04/2020), ED, underdeveloped pectoralis muscle right side, OA knees and shoulders.   CAD/HTN: Since last visit with me, he has seen the cardiologist.  Found to have CAD on cardiac catheterization with 80% mid LAD lesion and 50% mid circumflex lesion.  DES applied to the LAD lesion.  He is now on aspirin, Crestor and Plavix. -No chest pains or shortness of breath.  No recent use of sublingual nitroglycerin. -In regards to his blood pressure, he is taking amlodipine 10 mg daily, lisinopril 20 mg daily HCTZ 25 mg daily.  He is limiting salt in his foods.  DM: Results for orders placed or performed in visit on 01/21/22  POCT glycosylated hemoglobin (Hb A1C)  Result Value Ref Range   Hemoglobin A1C     HbA1c POC (<> result, manual entry)     HbA1c, POC (prediabetic range)     HbA1c, POC (controlled diabetic range) 7.9 (A) 0.0 - 7.0 %  Currently on metformin 500 mg once a day and Amaryl 4 mg daily.  Taking medications consistently.  Checks blood sugars 3 times a week with range being 150-170.  He feels he is doing better with his eating habits.  He plans to start walking this week.  Weight is down 6 pounds since advised saw him in May. Overdue for eye exam.  He now has Medicaid.  HM: He agrees to receiving the flu vaccine and shingles vaccine today.  Patient Active Problem List   Diagnosis Date Noted   Unstable angina (HCC)    Dyspnea on exertion 12/09/2021   Diabetes mellitus without complication (HCC) 12/08/2021   Neuropathy, arm, 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   PAD (peripheral artery disease) (HCC) 02/09/2021   Former smoker 08/26/2020   Influenza vaccine needed 03/13/2020   Insomnia 03/13/2020   Primary osteoarthritis of both shoulders  07/24/2019   Morbid obesity (HCC) 07/24/2019   Chronic pain of both shoulders 03/19/2019   Hyperlipidemia 12/16/2017   Other male erectile dysfunction 10/04/2013   Colon cancer screening 10/04/2013   Diabetes (HCC) 12/22/2012   Essential hypertension 12/22/2012   Essential hypertension, benign 12/22/2012   Type II or unspecified type diabetes mellitus without mention of complication, uncontrolled 10/17/2012     Current Outpatient Medications on File Prior to Visit  Medication Sig Dispense Refill   albuterol (VENTOLIN HFA) 108 (90 Base) MCG/ACT inhaler Inhale 2 puffs into the lungs every 6 (six) hours as needed for wheezing or shortness of breath. 8.5 g 2   amLODipine (NORVASC) 10 MG tablet Take 1 tablet (10 mg total) by mouth daily. 90 tablet 3   aspirin EC 81 MG tablet Take 1 tablet (81 mg total) by mouth daily. 30 tablet    budesonide-formoterol (SYMBICORT) 80-4.5 MCG/ACT inhaler Inhale 2 puffs into the lungs 2 (two) times daily. 10.2 g 3   cilostazol (PLETAL) 50 MG tablet Take 1 tablet (50 mg total) by mouth 2 (two) times daily. For peripheral vascular disease 180 tablet 3   clopidogrel (PLAVIX) 75 MG tablet Take 1 tablet (75 mg total) by mouth daily. 90 tablet 3   glimepiride (AMARYL) 4 MG tablet Take 1 tablet (4 mg total) by mouth daily with breakfast. 90 tablet 3   hydrochlorothiazide (  HYDRODIURIL) 25 MG tablet Take 1 tablet (25 mg total) by mouth daily. 90 tablet 3   lisinopril (ZESTRIL) 20 MG tablet Take 1 tablet (20 mg total) by mouth daily. 30 tablet 3   metFORMIN (GLUCOPHAGE-XR) 500 MG 24 hr tablet Take 1 tablet (500 mg total) by mouth daily with breakfast. 30 tablet 3   nitroGLYCERIN (NITROSTAT) 0.4 MG SL tablet Place 1 tablet (0.4 mg total) under the tongue every 5 (five) minutes as needed. 25 tablet 6   rosuvastatin (CRESTOR) 40 MG tablet Take 1 tablet (40 mg total) by mouth daily. 90 tablet 3   sildenafil (VIAGRA) 100 MG tablet TAKE 1/2 TO 1 TAB BY MOUTH 1 HOUR PRIOR TO  INTERCOURSE AS NEEDED 10 tablet 1   traZODone (DESYREL) 50 MG tablet TAKE 0.5-1 TABLETS (25-50 MG TOTAL) BY MOUTH AT BEDTIME AS NEEDED FOR SLEEP. 30 tablet 2   No current facility-administered medications on file prior to visit.    Allergies  Allergen Reactions   Shellfish Allergy Swelling    Social History   Socioeconomic History   Marital status: Divorced    Spouse name: Not on file   Number of children: Not on file   Years of education: Not on file   Highest education level: Not on file  Occupational History   Not on file  Tobacco Use   Smoking status: Former    Packs/day: 2.00    Types: Cigarettes    Quit date: 04/2020    Years since quitting: 1.7   Smokeless tobacco: Never  Vaping Use   Vaping Use: Never used  Substance and Sexual Activity   Alcohol use: No   Drug use: No   Sexual activity: Not on file  Other Topics Concern   Not on file  Social History Narrative   Not on file   Social Determinants of Health   Financial Resource Strain: Not on file  Food Insecurity: Not on file  Transportation Needs: Not on file  Physical Activity: Not on file  Stress: Not on file  Social Connections: Not on file  Intimate Partner Violence: Not on file    Family History  Problem Relation Age of Onset   Diabetes Mother    Heart disease Mother    Heart disease Father     Past Surgical History:  Procedure Laterality Date   CORONARY STENT INTERVENTION N/A 12/17/2021   Procedure: CORONARY STENT INTERVENTION;  Surgeon: Kathleene Hazel, MD;  [facility]: [facility];  Service: Cardiovascular;  Laterality: N/A;   INTRAVASCULAR PRESSURE WIRE/FFR STUDY N/A 12/17/2021   Procedure: INTRAVASCULAR PRESSURE WIRE/FFR STUDY;  Surgeon: Kathleene Hazel, MD;  Location: [facility];  Service: Cardiovascular;  Laterality: N/A;   LEFT HEART CATH AND CORONARY ANGIOGRAPHY N/A 12/17/2021   Procedure: LEFT HEART CATH AND CORONARY ANGIOGRAPHY;  Surgeon: Kathleene Hazel, MD;  Location: [facility];  Service: Cardiovascular;  Laterality: N/A;    ROS: Review of Systems Negative except as stated above  PHYSICAL EXAM: BP 130/74   Pulse 60   Ht 5\' 11"  (1.803 m)   Wt (!) 305 lb 3.2 oz (138.4 kg)   SpO2 94%   BMI 42.57 kg/m   Wt Readings from Last 3 Encounters:  01/21/22 (!) 305 lb 3.2 oz (138.4 kg)  01/19/22 (!) 306 lb 0.6 oz (138.8 kg)  12/17/21 300 lb (136.1 kg)    Physical Exam  General appearance - alert, well appearing, older Caucasian male, obese in  NAD  Mental status - normal mood, behavior, speech, dress, motor activity, and thought processes Neck - supple, no significant adenopathy Chest - clear to auscultation, no wheezes, rales or rhonchi, symmetric air entry Heart - normal rate, regular rhythm, normal S1, S2, no murmurs, rubs, clicks or gallops Extremities -no lower extremity edema.  Positive varicose veins on the lower legs.      Latest Ref Rng & Units 01/19/2022   10:30 AM 12/10/2021   11:57 AM 09/03/2021    3:06 PM  CMP  Glucose 70 - 99 mg/dL 528  413  244   BUN 8 - 27 mg/dL 16  16  18    Creatinine 0.76 - 1.27 mg/dL 0.10  2.72  5.36   Sodium 134 - 144 mmol/L 137  138  142   Potassium 3.5 - 5.2 mmol/L 4.6  4.6  4.4   Chloride 96 - 106 mmol/L 98  103  101   CO2 20 - 29 mmol/L 25  25  26    Calcium 8.6 - 10.2 mg/dL 9.7  9.5  9.8   Total Protein 6.0 - 8.5 g/dL 6.8     Total Bilirubin 0.0 - 1.2 mg/dL 0.3     Alkaline Phos 44 - 121 IU/L 87     AST 0 - 40 IU/L 17     ALT 0 - 44 IU/L 19      Lipid Panel     Component Value Date/Time   CHOL 112 01/19/2022 1030   TRIG 91 01/19/2022 1030   HDL 41 01/19/2022 1030   CHOLHDL 2.7 01/19/2022 1030   CHOLHDL 4.1 09/16/2014 1744   VLDL 22 09/16/2014 1744   LDLCALC 53 01/19/2022 1030    CBC    Component Value Date/Time   WBC 8.1 12/10/2021 1157   WBC 7.9 06/06/2013 1156   RBC 4.97 12/10/2021 1157   RBC 5.71 06/06/2013 1156   HGB 14.2 12/10/2021 1157   HCT  42.8 12/10/2021 1157   PLT 218 12/10/2021 1157   MCV 86 12/10/2021 1157   MCH 28.6 12/10/2021 1157   MCH 29.1 06/06/2013 1156   MCHC 33.2 12/10/2021 1157   MCHC 34.6 06/06/2013 1156   RDW 14.2 12/10/2021 1157   LYMPHSABS 2.4 06/06/2013 1156   MONOABS 0.6 06/06/2013 1156   EOSABS 0.2 06/06/2013 1156   BASOSABS 0.0 06/06/2013 1156    ASSESSMENT AND PLAN:  1. Type 2 diabetes mellitus with morbid obesity (HCC) Commended him on the 6 pound weight loss.  Discussed on encourage healthy eating habits.  Encouraged him to start moving more.  He plans to start walking several days a week.  Advised to take his nitroglycerin with him when he goes for walks. Continue metformin and Amaryl.  Add Jardiance.  Advised to stay hydrated when on this medication. - POCT glycosylated hemoglobin (Hb A1C) - empagliflozin (JARDIANCE) 10 MG TABS tablet; Take 1 tablet (10 mg total) by mouth daily before breakfast.  Dispense: 30 tablet; Refill: 5 - Ambulatory referral to Ophthalmology  2. Hypertension associated with type 2 diabetes mellitus (HCC) At goal.  Continue current medications listed above including lisinopril 20 mg, HCTZ 25 mg and amlodipine 10 mg daily.  3. Coronary artery disease involving native coronary artery of native heart without angina pectoris Stable.  Continue aspirin, Plavix and Crestor  4. Need for immunization against influenza  - Flu Vaccine QUAD 69mo+IM (Fluarix, Fluzone & Alfiuria Quad PF)  5. Need for shingles vaccine Given today.    Patient  was given the opportunity to ask questions.  Patient verbalized understanding of the plan and was able to repeat key elements of the plan.   This documentation was completed using Paediatric nurse.  Any transcriptional errors are unintentional.  Orders Placed This Encounter  Procedures   Varicella-zoster vaccine IM   Flu Vaccine QUAD 35mo+IM (Fluarix, Fluzone & Alfiuria Quad PF)   Ambulatory referral to Ophthalmology    POCT glycosylated hemoglobin (Hb A1C)     Requested Prescriptions   Signed Prescriptions Disp Refills   empagliflozin (JARDIANCE) 10 MG TABS tablet 30 tablet 5    Sig: Take 1 tablet (10 mg total) by mouth daily before breakfast.    Return in about 4 months (around 05/24/2022).  Jonah Blue, MD, FACP

## 2022-01-22 ENCOUNTER — Other Ambulatory Visit: Payer: Self-pay

## 2022-01-22 ENCOUNTER — Other Ambulatory Visit: Payer: Self-pay | Admitting: Pharmacist

## 2022-01-22 ENCOUNTER — Other Ambulatory Visit: Payer: Self-pay | Admitting: Internal Medicine

## 2022-01-22 MED ORDER — LISINOPRIL 20 MG PO TABS
20.0000 mg | ORAL_TABLET | Freq: Every day | ORAL | 3 refills | Status: DC
  Filled 2022-01-22 – 2022-03-01 (×2): qty 30, 30d supply, fill #0
  Filled 2022-03-29: qty 30, 30d supply, fill #1
  Filled 2022-05-03: qty 30, 30d supply, fill #2

## 2022-01-22 MED ORDER — DAPAGLIFLOZIN PROPANEDIOL 10 MG PO TABS
10.0000 mg | ORAL_TABLET | Freq: Every day | ORAL | 3 refills | Status: DC
Start: 2022-01-22 — End: 2022-05-14
  Filled 2022-01-22: qty 30, 30d supply, fill #0
  Filled 2022-02-15: qty 30, 30d supply, fill #1
  Filled 2022-03-16: qty 30, 30d supply, fill #2
  Filled 2022-04-19: qty 30, 30d supply, fill #3

## 2022-01-22 NOTE — Telephone Encounter (Signed)
Requested Prescriptions  Pending Prescriptions Disp Refills  . lisinopril (ZESTRIL) 20 MG tablet 30 tablet 3    Sig: Take 1 tablet (20 mg total) by mouth daily.     Cardiovascular:  ACE Inhibitors Passed - 01/22/2022 12:50 PM      Passed - Cr in normal range and within 180 days    Creatinine  Date Value Ref Range Status  07/24/2019 66.8 20.0 - 300.0 mg/dL Final   Creat  Date Value Ref Range Status  09/16/2014 0.87 0.50 - 1.35 mg/dL Final   Creatinine, Ser  Date Value Ref Range Status  01/19/2022 1.14 0.76 - 1.27 mg/dL Final         Passed - K in normal range and within 180 days    Potassium  Date Value Ref Range Status  01/19/2022 4.6 3.5 - 5.2 mmol/L Final         Passed - Patient is not pregnant      Passed - Last BP in normal range    BP Readings from Last 1 Encounters:  01/21/22 130/74         Passed - Valid encounter within last 6 months    Recent Outpatient Visits          Yesterday Type 2 diabetes mellitus with morbid obesity ([place])   [place] Karle Plumber B, MD   4 months ago Type 2 diabetes mellitus with morbid obesity [facility])   [place] Karle Plumber B, MD   11 months ago Type 2 diabetes mellitus with morbid obesity [facility])   [place], Deborah B, MD   1 year ago Type 2 diabetes mellitus with morbid obesity [facility])   [place], Jarome Matin, RPH-CPP   1 year ago Type 2 diabetes mellitus with morbid obesity [facility])   [place], RPH-CPP      Future Appointments            In 4 months Wynetta Emery, Dalbert Batman, MD [place]

## 2022-01-25 ENCOUNTER — Other Ambulatory Visit: Payer: Self-pay

## 2022-02-02 ENCOUNTER — Other Ambulatory Visit: Payer: Self-pay

## 2022-02-05 ENCOUNTER — Other Ambulatory Visit: Payer: Self-pay

## 2022-02-16 ENCOUNTER — Other Ambulatory Visit: Payer: Self-pay

## 2022-03-01 ENCOUNTER — Other Ambulatory Visit: Payer: Self-pay

## 2022-03-01 ENCOUNTER — Other Ambulatory Visit: Payer: Self-pay | Admitting: Internal Medicine

## 2022-03-01 DIAGNOSIS — I10 Essential (primary) hypertension: Secondary | ICD-10-CM

## 2022-03-01 DIAGNOSIS — E1169 Type 2 diabetes mellitus with other specified complication: Secondary | ICD-10-CM

## 2022-03-01 MED ORDER — AMLODIPINE BESYLATE 10 MG PO TABS
10.0000 mg | ORAL_TABLET | Freq: Every day | ORAL | 1 refills | Status: DC
  Filled 2022-03-01: qty 90, 90d supply, fill #0

## 2022-03-01 MED ORDER — HYDROCHLOROTHIAZIDE 25 MG PO TABS
25.0000 mg | ORAL_TABLET | Freq: Every day | ORAL | 1 refills | Status: DC
  Filled 2022-03-01: qty 90, 90d supply, fill #0

## 2022-03-01 MED ORDER — GLIMEPIRIDE 4 MG PO TABS
4.0000 mg | ORAL_TABLET | Freq: Every day | ORAL | 1 refills | Status: DC
  Filled 2022-03-01: qty 90, 90d supply, fill #0

## 2022-03-02 ENCOUNTER — Other Ambulatory Visit: Payer: Self-pay

## 2022-03-10 ENCOUNTER — Other Ambulatory Visit: Payer: Self-pay

## 2022-03-12 ENCOUNTER — Other Ambulatory Visit: Payer: Self-pay

## 2022-03-12 DIAGNOSIS — Z419 Encounter for procedure for purposes other than remedying health state, unspecified: Secondary | ICD-10-CM | POA: Diagnosis not present

## 2022-03-16 ENCOUNTER — Other Ambulatory Visit: Payer: Self-pay

## 2022-03-18 ENCOUNTER — Other Ambulatory Visit: Payer: Self-pay

## 2022-03-19 ENCOUNTER — Other Ambulatory Visit: Payer: Self-pay

## 2022-03-29 ENCOUNTER — Other Ambulatory Visit: Payer: Self-pay | Admitting: Internal Medicine

## 2022-03-29 ENCOUNTER — Telehealth: Payer: Self-pay | Admitting: Internal Medicine

## 2022-03-29 ENCOUNTER — Other Ambulatory Visit: Payer: Self-pay

## 2022-03-29 DIAGNOSIS — I739 Peripheral vascular disease, unspecified: Secondary | ICD-10-CM

## 2022-03-30 ENCOUNTER — Other Ambulatory Visit: Payer: Self-pay

## 2022-03-30 NOTE — Telephone Encounter (Signed)
Called & spoke to patient. Verified name & DOB. Informed to hold off taking the Pletal since he is on Plavix & Aspirin. Patient expressed understanding.

## 2022-04-19 ENCOUNTER — Other Ambulatory Visit: Payer: Self-pay

## 2022-04-21 ENCOUNTER — Other Ambulatory Visit: Payer: Self-pay

## 2022-05-04 ENCOUNTER — Other Ambulatory Visit: Payer: Self-pay

## 2022-05-13 ENCOUNTER — Encounter: Payer: Self-pay | Admitting: Internal Medicine

## 2022-05-13 ENCOUNTER — Ambulatory Visit: Payer: Medicare Other | Attending: Internal Medicine | Admitting: Internal Medicine

## 2022-05-13 DIAGNOSIS — E1159 Type 2 diabetes mellitus with other circulatory complications: Secondary | ICD-10-CM

## 2022-05-13 DIAGNOSIS — E1136 Type 2 diabetes mellitus with diabetic cataract: Secondary | ICD-10-CM | POA: Insufficient documentation

## 2022-05-13 DIAGNOSIS — Z6841 Body Mass Index (BMI) 40.0 and over, adult: Secondary | ICD-10-CM | POA: Diagnosis not present

## 2022-05-13 DIAGNOSIS — I2510 Atherosclerotic heart disease of native coronary artery without angina pectoris: Secondary | ICD-10-CM | POA: Diagnosis not present

## 2022-05-13 DIAGNOSIS — Z87891 Personal history of nicotine dependence: Secondary | ICD-10-CM | POA: Insufficient documentation

## 2022-05-13 DIAGNOSIS — E1140 Type 2 diabetes mellitus with diabetic neuropathy, unspecified: Secondary | ICD-10-CM | POA: Insufficient documentation

## 2022-05-13 DIAGNOSIS — M19012 Primary osteoarthritis, left shoulder: Secondary | ICD-10-CM

## 2022-05-13 DIAGNOSIS — M25512 Pain in left shoulder: Secondary | ICD-10-CM | POA: Insufficient documentation

## 2022-05-13 DIAGNOSIS — G8929 Other chronic pain: Secondary | ICD-10-CM | POA: Insufficient documentation

## 2022-05-13 DIAGNOSIS — I152 Hypertension secondary to endocrine disorders: Secondary | ICD-10-CM | POA: Diagnosis not present

## 2022-05-13 DIAGNOSIS — I10 Essential (primary) hypertension: Secondary | ICD-10-CM | POA: Diagnosis not present

## 2022-05-13 DIAGNOSIS — M19011 Primary osteoarthritis, right shoulder: Secondary | ICD-10-CM

## 2022-05-13 DIAGNOSIS — Z7982 Long term (current) use of aspirin: Secondary | ICD-10-CM | POA: Insufficient documentation

## 2022-05-13 DIAGNOSIS — E1151 Type 2 diabetes mellitus with diabetic peripheral angiopathy without gangrene: Secondary | ICD-10-CM | POA: Diagnosis not present

## 2022-05-13 DIAGNOSIS — M170 Bilateral primary osteoarthritis of knee: Secondary | ICD-10-CM | POA: Diagnosis not present

## 2022-05-13 DIAGNOSIS — Z7984 Long term (current) use of oral hypoglycemic drugs: Secondary | ICD-10-CM | POA: Diagnosis not present

## 2022-05-13 DIAGNOSIS — I25110 Atherosclerotic heart disease of native coronary artery with unstable angina pectoris: Secondary | ICD-10-CM | POA: Diagnosis not present

## 2022-05-13 DIAGNOSIS — Z79899 Other long term (current) drug therapy: Secondary | ICD-10-CM | POA: Diagnosis not present

## 2022-05-13 DIAGNOSIS — E1169 Type 2 diabetes mellitus with other specified complication: Secondary | ICD-10-CM | POA: Diagnosis present

## 2022-05-13 DIAGNOSIS — I739 Peripheral vascular disease, unspecified: Secondary | ICD-10-CM

## 2022-05-13 DIAGNOSIS — Z7902 Long term (current) use of antithrombotics/antiplatelets: Secondary | ICD-10-CM | POA: Insufficient documentation

## 2022-05-13 DIAGNOSIS — M25511 Pain in right shoulder: Secondary | ICD-10-CM | POA: Insufficient documentation

## 2022-05-13 LAB — GLUCOSE, POCT (MANUAL RESULT ENTRY): POC Glucose: 96 mg/dl (ref 70–99)

## 2022-05-13 LAB — POCT GLYCOSYLATED HEMOGLOBIN (HGB A1C): HbA1c, POC (controlled diabetic range): 6.6 % (ref 0.0–7.0)

## 2022-05-13 NOTE — Progress Notes (Signed)
Patient ID: Luis Larson, male    DOB: June 15, 1955  MRN: [id number]  CC: Diabetes (Dm f/u. Med refil - requesting 90 day refills. /Requesting referral to surgeon R shoulder. )   Subjective: Luis Larson is a 67 y.o. male who presents for chronic ds management His concerns today include:  Pt with hx of HTN, DM, HL, former tob dep (quit 04/2020), ED, underdeveloped pectoralis muscle right side, OA knees and shoulders.    HTN/CAD-No chest pains or shortness of breath.  No recent use of sublingual nitroglycerin.  Compliant with Plavix, aspirin and Crestor 40 mg daily. -In regards to his blood pressure, he is taking amlodipine 10 mg daily, lisinopril 20 mg daily HCTZ 25 mg daily.  He is limiting salt in his foods.  DM: Results for orders placed or performed in visit on 05/13/22  POCT glucose (manual entry)  Result Value Ref Range   POC Glucose 96 70 - 99 mg/dl  POCT glycosylated hemoglobin (Hb A1C)  Result Value Ref Range   Hemoglobin A1C     HbA1c POC (<> result, manual entry)     HbA1c, POC (prediabetic range)     HbA1c, POC (controlled diabetic range) 6.6 0.0 - 7.0 %  Currently on metformin 500 mg once a day, Farxiga and Amaryl 4 mg daily.  Taking medications consistently.  Checks blood sugars 1 times a week with range being 120-130.  He feels he is doing better with his eating habits.  He plans to start walking this week.  Weight is down 3 pounds since last visit.  He is considering getting back on Trulicity to help with weight loss and wanted to know my thoughts on that. High eye exam at [facility] 2 wks.  Has cataract LT eye.   He now has Medicare. Would like referral to orthopedics for shoulders.  He has chronic pain in the shoulders RT>LT due to significant arthritis.   Patient Active Problem List   Diagnosis Date Noted   Coronary artery disease involving native coronary artery of native heart without angina pectoris 01/21/2022   Unstable angina (HCC)    Dyspnea on  exertion 12/09/2021   Diabetes mellitus without complication (HCC) 12/08/2021   Neuropathy, arm, 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   PAD (peripheral artery disease) (HCC) 02/09/2021   Former smoker 08/26/2020   Influenza vaccine needed 03/13/2020   Insomnia 03/13/2020   Primary osteoarthritis of both shoulders 07/24/2019   Morbid obesity (HCC) 07/24/2019   Chronic pain of both shoulders 03/19/2019   Hyperlipidemia 12/16/2017   Other male erectile dysfunction 10/04/2013   Colon cancer screening 10/04/2013   Diabetes (HCC) 12/22/2012   Essential hypertension 12/22/2012   Essential hypertension, benign 12/22/2012   Type II or unspecified type diabetes mellitus without mention of complication, uncontrolled 10/17/2012     Current Outpatient Medications on File Prior to Visit  Medication Sig Dispense Refill   albuterol (VENTOLIN HFA) 108 (90 Base) MCG/ACT inhaler Inhale 2 puffs into the lungs every 6 (six) hours as needed for wheezing or shortness of breath. 8.5 g 2   amLODipine (NORVASC) 10 MG tablet Take 1 tablet (10 mg total) by mouth daily. 90 tablet 1   aspirin EC 81 MG tablet Take 1 tablet (81 mg total) by mouth daily. 30 tablet    budesonide-formoterol (SYMBICORT) 80-4.5 MCG/ACT inhaler Inhale 2 puffs into the lungs 2 (two) times daily. 10.2 g 3   clopidogrel (PLAVIX) 75 MG tablet Take 1 tablet (75 mg total) by mouth  daily. 90 tablet 3   dapagliflozin propanediol (FARXIGA) 10 MG TABS tablet Take 1 tablet (10 mg total) by mouth daily before breakfast. 30 tablet 3   glimepiride (AMARYL) 4 MG tablet Take 1 tablet (4 mg total) by mouth daily with breakfast. 90 tablet 1   hydrochlorothiazide (HYDRODIURIL) 25 MG tablet Take 1 tablet (25 mg total) by mouth daily. 90 tablet 1   lisinopril (ZESTRIL) 20 MG tablet Take 1 tablet (20 mg total) by mouth daily. 30 tablet 3   metFORMIN (GLUCOPHAGE-XR) 500 MG 24 hr tablet Take 1 tablet (500 mg total) by mouth daily with breakfast. 30 tablet 3   rosuvastatin (CRESTOR)  40 MG tablet Take 1 tablet (40 mg total) by mouth daily. 90 tablet 3   sildenafil (VIAGRA) 100 MG tablet TAKE 1/2 TO 1 TAB BY MOUTH 1 HOUR PRIOR TO INTERCOURSE AS NEEDED 10 tablet 1   traZODone (DESYREL) 50 MG tablet TAKE 0.5-1 TABLETS (25-50 MG TOTAL) BY MOUTH AT BEDTIME AS NEEDED FOR SLEEP. 30 tablet 2   nitroGLYCERIN (NITROSTAT) 0.4 MG SL tablet Place 1 tablet (0.4 mg total) under the tongue every 5 (five) minutes as needed. 25 tablet 6   No current facility-administered medications on file prior to visit.    Allergies  Allergen Reactions   Shellfish Allergy Swelling    Social History   Socioeconomic History   Marital status: Divorced    Spouse name: Not on file   Number of children: Not on file   Years of education: Not on file   Highest education level: Not on file  Occupational History   Not on file  Tobacco Use   Smoking status: Former    Packs/day: 2.00    Types: Cigarettes    Quit date: 04/2020    Years since quitting: 2.0   Smokeless tobacco: Never  Vaping Use   Vaping Use: Never used  Substance and Sexual Activity   Alcohol use: No   Drug use: No   Sexual activity: Not on file  Other Topics Concern   Not on file  Social History Narrative   Not on file   Social Determinants of Health   Financial Resource Strain: Not on file  Food Insecurity: Not on file  Transportation Needs: Not on file  Physical Activity: Not on file  Stress: Not on file  Social Connections: Not on file  Intimate Partner Violence: Not on file    Family History  Problem Relation Age of Onset   Diabetes Mother    Heart disease Mother    Heart disease Father     Past Surgical History:  Procedure Laterality Date   CORONARY STENT INTERVENTION N/A 12/17/2021   Procedure: CORONARY STENT INTERVENTION;  Surgeon: Kathleene Hazel, MD;  Location: [facility];  Service: Cardiovascular;  Laterality: N/A;   INTRAVASCULAR PRESSURE WIRE/FFR STUDY N/A 12/17/2021   Procedure:  INTRAVASCULAR PRESSURE WIRE/FFR STUDY;  Surgeon: Kathleene Hazel, MD;  Location: [facility];  Service: Cardiovascular;  Laterality: N/A;   LEFT HEART CATH AND CORONARY ANGIOGRAPHY N/A 12/17/2021   Procedure: LEFT HEART CATH AND CORONARY ANGIOGRAPHY;  Surgeon: Kathleene Hazel, MD;  Location: [facility];  Service: Cardiovascular;  Laterality: N/A;    ROS: Review of Systems Negative except as stated above  PHYSICAL EXAM: BP 127/76   Pulse (!) 56   Temp 97.9 F (36.6 C) (Oral)   Ht 5\' 11"  (1.803 m)   Wt (!) 303 lb (137.4 kg)  SpO2 93%   BMI 42.26 kg/m   Wt Readings from Last 3 Encounters:  05/13/22 (!) 303 lb (137.4 kg)  01/21/22 (!) 305 lb 3.2 oz (138.4 kg)  01/19/22 (!) 306 lb 0.6 oz (138.8 kg)    Physical Exam   General appearance - alert, well appearing, and in no distress Mental status - normal mood, behavior, speech, dress, motor activity, and thought processes Neck - supple, no significant adenopathy Chest - clear to auscultation, no wheezes, rales or rhonchi, symmetric air entry Heart - normal rate, regular rhythm, normal S1, S2, no murmurs, rubs, clicks or gallops Extremities - peripheral pulses normal, no pedal edema, no clubbing or cyanosis     Latest Ref Rng & Units 01/19/2022   10:30 AM 12/10/2021   11:57 AM 09/03/2021    3:06 PM  CMP  Glucose 70 - 99 mg/dL 528  413  244   BUN 8 - 27 mg/dL 16  16  18    Creatinine 0.76 - 1.27 mg/dL 0.10  2.72  5.36   Sodium 134 - 144 mmol/L 137  138  142   Potassium 3.5 - 5.2 mmol/L 4.6  4.6  4.4   Chloride 96 - 106 mmol/L 98  103  101   CO2 20 - 29 mmol/L 25  25  26    Calcium 8.6 - 10.2 mg/dL 9.7  9.5  9.8   Total Protein 6.0 - 8.5 g/dL 6.8     Total Bilirubin 0.0 - 1.2 mg/dL 0.3     Alkaline Phos 44 - 121 IU/L 87     AST 0 - 40 IU/L 17     ALT 0 - 44 IU/L 19      Lipid Panel     Component Value Date/Time   CHOL 112 01/19/2022 1030   TRIG 91 01/19/2022 1030   HDL 41 01/19/2022 1030    CHOLHDL 2.7 01/19/2022 1030   CHOLHDL 4.1 09/16/2014 1744   VLDL 22 09/16/2014 1744   LDLCALC 53 01/19/2022 1030    CBC    Component Value Date/Time   WBC 8.1 12/10/2021 1157   WBC 7.9 06/06/2013 1156   RBC 4.97 12/10/2021 1157   RBC 5.71 06/06/2013 1156   HGB 14.2 12/10/2021 1157   HCT 42.8 12/10/2021 1157   PLT 218 12/10/2021 1157   MCV 86 12/10/2021 1157   MCH 28.6 12/10/2021 1157   MCH 29.1 06/06/2013 1156   MCHC 33.2 12/10/2021 1157   MCHC 34.6 06/06/2013 1156   RDW 14.2 12/10/2021 1157   LYMPHSABS 2.4 06/06/2013 1156   MONOABS 0.6 06/06/2013 1156   EOSABS 0.2 06/06/2013 1156   BASOSABS 0.0 06/06/2013 1156    ASSESSMENT AND PLAN:  1. Type 2 diabetes mellitus with morbid obesity (HCC) At goal.  We discussed having him stop Amaryl and putting him on Trulicity instead to help with weight loss.  Patient ultimately decided to stay on the Amaryl, Farxiga and metformin for now.  He wants to work on trying to get the weight off naturally by continuing to improve on his eating habits and trying to move more.  We can readdress on subsequent visit if he changes his mind. - POCT glucose (manual entry) - POCT glycosylated hemoglobin (Hb A1C) - Microalbumin / creatinine urine ratio  2. Hypertension associated with type 2 diabetes mellitus (HCC) At goal.  Continue amlodipine 10 mg daily, HCTZ 25 mg daily and lisinopril 20 mg daily.  3. Coronary artery disease involving native coronary artery of  native heart without angina pectoris Stable.  Continue aspirin, Plavix, Crestor  4. PAD (peripheral artery disease) (HCC) Pletal was discontinued.  He will continue on Plavix.  Reports no claudication symptoms at this time  5. Primary osteoarthritis of both shoulders - Ambulatory referral to Orthopedic Surgery   Patient was given the opportunity to ask questions.  Patient verbalized understanding of the plan and was able to repeat key elements of the plan.   This documentation was  completed using Paediatric nurse.  Any transcriptional errors are unintentional.  Orders Placed This Encounter  Procedures   Microalbumin / creatinine urine ratio   Ambulatory referral to Orthopedic Surgery   POCT glucose (manual entry)   POCT glycosylated hemoglobin (Hb A1C)     Requested Prescriptions    No prescriptions requested or ordered in this encounter    Return in about 6 weeks (around 06/24/2022) for Welcome to Medicare Visit with me in 6 wks.  Jonah Blue, MD, FACP

## 2022-05-14 ENCOUNTER — Other Ambulatory Visit: Payer: Self-pay | Admitting: Internal Medicine

## 2022-05-14 DIAGNOSIS — I10 Essential (primary) hypertension: Secondary | ICD-10-CM

## 2022-05-14 MED ORDER — AMLODIPINE BESYLATE 10 MG PO TABS: 10.0000 mg | ORAL_TABLET | Freq: Every day | ORAL | 1 refills | Status: DC

## 2022-05-14 MED ORDER — DAPAGLIFLOZIN PROPANEDIOL 10 MG PO TABS: 10.0000 mg | ORAL_TABLET | Freq: Every day | ORAL | 1 refills | Status: DC

## 2022-05-14 MED ORDER — HYDROCHLOROTHIAZIDE 25 MG PO TABS: 25.0000 mg | ORAL_TABLET | Freq: Every day | ORAL | 1 refills | Status: DC

## 2022-05-14 MED ORDER — CLOPIDOGREL BISULFATE 75 MG PO TABS: 75.0000 mg | ORAL_TABLET | Freq: Every day | ORAL | 2 refills | Status: DC

## 2022-05-14 MED ORDER — METFORMIN HCL ER 500 MG PO TB24: 500.0000 mg | ORAL_TABLET | Freq: Every day | ORAL | 1 refills | Status: DC

## 2022-05-14 MED ORDER — ROSUVASTATIN CALCIUM 40 MG PO TABS: 40.0000 mg | ORAL_TABLET | Freq: Every day | ORAL | 1 refills | Status: DC

## 2022-05-14 MED ORDER — GLIMEPIRIDE 4 MG PO TABS: 4.0000 mg | ORAL_TABLET | Freq: Every day | ORAL | 1 refills | Status: DC

## 2022-05-14 MED ORDER — LISINOPRIL 20 MG PO TABS: 20.0000 mg | ORAL_TABLET | Freq: Every day | ORAL | 1 refills | Status: DC

## 2022-05-16 LAB — MICROALBUMIN / CREATININE URINE RATIO
Creatinine, Urine: 76.6 mg/dL
Microalb/Creat Ratio: 16 mg/g creat (ref 0–29)
Microalbumin, Urine: 12 ug/mL

## 2022-05-19 ENCOUNTER — Ambulatory Visit: Payer: Medicare Other | Admitting: Sports Medicine

## 2022-05-21 ENCOUNTER — Ambulatory Visit (INDEPENDENT_AMBULATORY_CARE_PROVIDER_SITE_OTHER): Payer: Medicare Other

## 2022-05-21 ENCOUNTER — Ambulatory Visit (INDEPENDENT_AMBULATORY_CARE_PROVIDER_SITE_OTHER): Payer: Medicare Other | Admitting: Sports Medicine

## 2022-05-21 ENCOUNTER — Encounter: Payer: Self-pay | Admitting: Sports Medicine

## 2022-05-21 DIAGNOSIS — G8929 Other chronic pain: Secondary | ICD-10-CM

## 2022-05-21 DIAGNOSIS — M12811 Other specific arthropathies, not elsewhere classified, right shoulder: Secondary | ICD-10-CM

## 2022-05-21 DIAGNOSIS — R200 Anesthesia of skin: Secondary | ICD-10-CM | POA: Diagnosis not present

## 2022-05-21 DIAGNOSIS — M25511 Pain in right shoulder: Secondary | ICD-10-CM | POA: Diagnosis not present

## 2022-05-21 DIAGNOSIS — S46212A Strain of muscle, fascia and tendon of other parts of biceps, left arm, initial encounter: Secondary | ICD-10-CM

## 2022-05-21 DIAGNOSIS — M12812 Other specific arthropathies, not elsewhere classified, left shoulder: Secondary | ICD-10-CM

## 2022-05-21 DIAGNOSIS — R202 Paresthesia of skin: Secondary | ICD-10-CM

## 2022-05-21 NOTE — Progress Notes (Signed)
Right shoulder pain for years No known injury Has not had any type of treatment done thus far for shoulder No radicular type pain Does have limited ROM due to pain

## 2022-05-21 NOTE — Progress Notes (Signed)
Luis Larson - 67 y.o. male MRN [id number]  Date of birth: [birth year]  Office Visit Note: Visit Date: 05/21/2022 PCP: Marcine Matar, MD Referred by: Marcine Matar, MD  Subjective: Chief Complaint  Patient presents with   Right Shoulder - Pain   HPI: Luis Larson is a pleasant 67 y.o. male who presents today for bilateral shoulder pain, R > L.  He has had pain in both shoulders for many years.  He states he injured the left shoulder back in 1996 when he tried to catch about 300 pounds labs that fell and he felt a rip in the left arm.  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  His right shoulder is actually worse than the left.  He is right-hand dominant.  He gets crepitus within the shoulder but also some increased pain and some weakness with certain reaching maneuvers.  Pain is located more so over the anterior lateral shoulder that radiates into the deltoid.  He has done some rehab on his own in the past without significant improvement.  Denies any radicular pain past the elbow, but separately he states for years he has had numbness in both the fourth and fifth finger on the right hand.  Reports diminished sensation review.  Pertinent ROS were reviewed with the patient and found to be negative unless otherwise specified above in HPI.   Assessment & Plan: Visit Diagnoses:  1. Chronic right shoulder pain   2. Rotator cuff arthropathy of both shoulders   3. Biceps muscle tear, left, initial encounter   4. Numbness and tingling in right hand    Plan: Reviewed and interpreted Bergen's previous x-rays as well as his new x-ray of the right shoulder today.  I believe he has multiple conditions going on.  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  Discussed with him that there is nothing that can be done surgically for this at this point since it has been so long.  The left shoulder bothers  him slightly, but the right one is much more bothersome.  He is right-hand dominant.  He has at least moderate glenohumeral joint and AC joint arthritic change, but more of this issue is weakness and pain associated with rotator cuff symptoms.  Given the chronicity of his symptoms and lack of improvement with home rehab exercises, I do think it is pertinent to obtain an MRI of the shoulder to evaluate for the degree of likely rotator cuff tearing that he has.  We will follow-up in 3 business days after his MRI of the right shoulder.  He may use over-the-counter anti-inflammatories as he wishes.  Did provide a custom handout for rotator cuff exercises for him today, my athletic trainer did review these with him.  He will continue to do these once daily as able.  In terms of the numbness and tingling in the fourth and fifth digit, he does not have provocative symptoms at the neck.  We will address the shoulder first, but could always consider nerve conduction studies.   Follow-up: Return for F/u 3 business days after MRI right shoulder .   Meds & Orders: No orders of the defined types were placed in this encounter.   Orders Placed This Encounter  Procedures   XR Shoulder Right   MR SHOULDER RIGHT WO CONTRAST     Procedures: No procedures performed  Clinical History: No specialty comments available.  He reports that he quit smoking about 2 years ago. His smoking use included cigarettes. He smoked an average of 2 packs per day. He has never used smokeless tobacco.  Recent Labs    09/03/21 1425 01/21/22 1502 05/13/22 1634  HGBA1C 6.7 7.9* 6.6    Objective:    Physical Exam  Gen: Well-appearing, in no acute distress; non-toxic CV:  Well-perfused. Warm.  Resp: Breathing unlabored on room air; no wheezing. Psych: Fluid speech in conversation; appropriate affect; normal thought process Neuro: Sensation intact throughout. No gross coordination deficits.   Ortho Exam - Right shoulder:  No AC joint or bony TTP.  No joint swelling or redness.  There is restriction in active range of motion with forward flexion and abduction, although able to be taken near full range with passive motion.  He is blocked to external rotation to about 65 degrees.  Positive drop arm test, positive empty can, positive pain with resisted ER.  Negative speeds test, liftoff. NVI.  - Left shoulder: + Reverse Popeye deformity noted.  No bony TTP.  Full active and passive range of motion.  Very mild pain with resisted abduction.  About 80 to 85% with rest resisted flexion and pronation/supination.  To the right arm.  Imaging: XR Shoulder Right  Result Date: 05/21/2022 2 views of the right shoulder including AP, Grashey and axial view were ordered and interpreted by myself.  X-rays demonstrate at least moderate glenohumeral joint and AC joint arthropathy.  Mild degree of superior head migration.  No acute fracture or dislocation noted.   *Independent review of the left shoulder x-ray from 05/02/2019 was interpreted by myself.  3 views of the left shoulder show mild before meals and glenohumeral joint arthritis.  The humeral head is well located without evidence of dislocation, however there is superior migration of the humeral head, indicative of significant rotator cuff arthropathy.  Questionable loose body versus spurring intra-articularly.  Narrative & Impression  CLINICAL DATA:  Chronic shoulder pain   EXAM: LEFT SHOULDER - 2+ VIEW   COMPARISON:  None.   FINDINGS: No acute fracture. No dislocation. Decreased acromiohumeral interval suggesting underlying rotator cuff pathology. There is a 7 mm calcified density along the superior margin of the glenohumeral joint space which may represent an intra-articular loose body. Mild degenerative changes of the glenohumeral and acromioclavicular joints. Soft tissues within normal limits.   IMPRESSION: 1. Mild degenerative changes of the left shoulder. 2.  Decreased acromiohumeral interval suggesting underlying rotator cuff pathology. 3. Possible 7 mm intra-articular loose body.     Electronically Signed   By: Duanne Guess D.O.   On: 05/10/2019 13:23        Past Medical/Family/Surgical/Social History: Medications & Allergies reviewed per EMR, new medications updated. Patient Active Problem List   Diagnosis Date Noted   Coronary artery disease involving native coronary artery of native heart without angina pectoris 01/21/2022   Unstable angina (HCC)    Dyspnea on exertion 12/09/2021   Diabetes mellitus without complication (HCC) 12/08/2021   Neuropathy, arm, 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   PAD (peripheral artery disease) (HCC) 02/09/2021   Former smoker 08/26/2020   Influenza vaccine needed 03/13/2020   Insomnia 03/13/2020   Primary osteoarthritis of both shoulders 07/24/2019   Morbid obesity (HCC) 07/24/2019   Chronic pain of both shoulders 03/19/2019   Hyperlipidemia 12/16/2017   Other male erectile dysfunction 10/04/2013   Colon cancer screening 10/04/2013   Diabetes (HCC) 12/22/2012   Essential  hypertension 12/22/2012   Essential hypertension, benign 12/22/2012   Type II or unspecified type diabetes mellitus without mention of complication, uncontrolled 10/17/2012   Past Medical History:  Diagnosis Date   Chronic pain of both shoulders 03/19/2019   Colon cancer screening 10/04/2013   Diabetes (HCC) 12/22/2012   Diabetes mellitus without complication (HCC)    Dyspnea on exertion 12/09/2021   Essential hypertension 12/22/2012   Essential hypertension, benign 12/22/2012   Former smoker 08/26/2020   Hyperlipidemia 12/16/2017   Influenza vaccine needed 03/13/2020   Insomnia 03/13/2020   Morbid obesity (HCC) 07/24/2019   Neuropathy, arm, 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   Other male erectile dysfunction 10/04/2013   PAD (peripheral artery disease) (HCC) 02/09/2021   Primary osteoarthritis of both shoulders 07/24/2019   Type II or unspecified type  diabetes mellitus without mention of complication, uncontrolled 10/17/2012   Unstable angina (HCC)    Family History  Problem Relation Age of Onset   Diabetes Mother    Heart disease Mother    Heart disease Father    Past Surgical History:  Procedure Laterality Date   CORONARY STENT INTERVENTION N/A 12/17/2021   Procedure: CORONARY STENT INTERVENTION;  Surgeon: Kathleene Hazel, MD;  Location: [facility];  Service: Cardiovascular;  Laterality: N/A;   INTRAVASCULAR PRESSURE WIRE/FFR STUDY N/A 12/17/2021   Procedure: INTRAVASCULAR PRESSURE WIRE/FFR STUDY;  Surgeon: Kathleene Hazel, MD;  Location: [facility];  Service: Cardiovascular;  Laterality: N/A;   LEFT HEART CATH AND CORONARY ANGIOGRAPHY N/A 12/17/2021   Procedure: LEFT HEART CATH AND CORONARY ANGIOGRAPHY;  Surgeon: Kathleene Hazel, MD;  Location: [facility];  Service: Cardiovascular;  Laterality: N/A;   Social History   Occupational History   Not on file  Tobacco Use   Smoking status: Former    Packs/day: 2.00    Types: Cigarettes    Quit date: 04/2020    Years since quitting: 2.1   Smokeless tobacco: Never  Vaping Use   Vaping Use: Never used  Substance and Sexual Activity   Alcohol use: No   Drug use: No   Sexual activity: Not on file

## 2022-05-31 ENCOUNTER — Ambulatory Visit: Payer: Medicaid Other | Admitting: Internal Medicine

## 2022-06-04 ENCOUNTER — Telehealth: Payer: Self-pay | Admitting: Sports Medicine

## 2022-06-04 NOTE — Telephone Encounter (Signed)
Called patient left message to return  call to schedule an appointment with Dr Rolena Infante for MRI review after 06/09/2022   -  3 days after MRI

## 2022-06-09 ENCOUNTER — Inpatient Hospital Stay: Admission: RE | Admit: 2022-06-09 | Payer: Medicare Other | Source: Ambulatory Visit

## 2022-06-10 ENCOUNTER — Ambulatory Visit: Payer: Medicare Other | Admitting: Sports Medicine

## 2022-06-12 ENCOUNTER — Ambulatory Visit (INDEPENDENT_AMBULATORY_CARE_PROVIDER_SITE_OTHER): Payer: Medicare Other

## 2022-06-12 ENCOUNTER — Ambulatory Visit
Admission: RE | Admit: 2022-06-12 | Discharge: 2022-06-12 | Disposition: A | Discharge status: Home / Self Care | Payer: Medicare Other | Source: Ambulatory Visit | Attending: Family Medicine | Admitting: Family Medicine

## 2022-06-12 VITALS — BP 146/67 | HR 57 | Temp 98.1°F | Resp 18 | Ht 71.0 in | Wt 312.0 lb

## 2022-06-12 DIAGNOSIS — R042 Hemoptysis: Secondary | ICD-10-CM | POA: Diagnosis present

## 2022-06-12 DIAGNOSIS — I10 Essential (primary) hypertension: Secondary | ICD-10-CM | POA: Insufficient documentation

## 2022-06-12 DIAGNOSIS — E785 Hyperlipidemia, unspecified: Secondary | ICD-10-CM | POA: Insufficient documentation

## 2022-06-12 DIAGNOSIS — Z7901 Long term (current) use of anticoagulants: Secondary | ICD-10-CM

## 2022-06-12 DIAGNOSIS — I25810 Atherosclerosis of coronary artery bypass graft(s) without angina pectoris: Secondary | ICD-10-CM

## 2022-06-12 DIAGNOSIS — Z955 Presence of coronary angioplasty implant and graft: Secondary | ICD-10-CM

## 2022-06-12 DIAGNOSIS — I2510 Atherosclerotic heart disease of native coronary artery without angina pectoris: Secondary | ICD-10-CM | POA: Insufficient documentation

## 2022-06-12 DIAGNOSIS — E118 Type 2 diabetes mellitus with unspecified complications: Secondary | ICD-10-CM | POA: Insufficient documentation

## 2022-06-12 HISTORY — DX: Type 2 diabetes mellitus without complications: E11.9

## 2022-06-12 HISTORY — DX: Atherosclerotic heart disease of native coronary artery without angina pectoris: I25.10

## 2022-06-12 HISTORY — DX: Hemoptysis: R04.2

## 2022-06-12 HISTORY — DX: Type 2 diabetes mellitus with unspecified complications: E11.8

## 2022-06-12 NOTE — ED Notes (Signed)
Pt waiting in lobby  w/ s/o. Dr Meda Coffee is waitng on a call back from cardiology.

## 2022-06-12 NOTE — Discharge Instructions (Signed)
Stop Plavix Make sure you are drinking lots of fluids May continue your baby aspirin Call your doctor on Monday for additional follow-up You will need to be seen by your internal medicine doctor or cardiology doctor next week If you have increased bleeding, go directly to an emergency room.  If the bleeding is severe call 911

## 2022-06-12 NOTE — ED Notes (Signed)
Pt updated - still waiting on a call back from cardiology. Pt asking if he can go home and be called at home with instructions once Dr Meda Coffee has spoken w/ the cardiologist. RN to review w/ Dr Meda Coffee & will update pt

## 2022-06-12 NOTE — ED Triage Notes (Signed)
Patient productive cough x 1 week.  Patient use to be a smoker, no longer.  States that his phlegm has blood in it.  Denies any fever.

## 2022-06-12 NOTE — ED Provider Notes (Signed)
[facility]    CSN: [id number] Arrival date & time: 06/12/22  1223      History   Chief Complaint Chief Complaint  Patient presents with   Cough    Bloody Phlegm - Entered by patient    HPI Luis Larson is a 67 y.o. male.   HPI  Patient is here for hemoptysis.  He states that for the last week, worse today he has been coughing up blood.  At first it was blood streaks.  Today he is coughing up what looks like blood clots.  He has had no recent illness.  No postnasal drip.  No sinus drainage.  No congestion.  No fever or chills.  He feels well.  He has had no shortness of breath. Patient is on Plavix and aspirin ever since a cardiac stenting of his LAD.  This was done in September 2023.  Patient states that he was a heavy smoker from the time he was 67 years old until he was 64.  He denies COPD but states that he does need inhalers for shortness of breath.   Past Medical History:  Diagnosis Date   Diabetes mellitus without complication [facility])     Patient Active Problem List   Diagnosis Date Noted   Acute DM type 2 causing complication ([place]) [id number]   Hyperlipidemia 06/12/2022   Essential hypertension, benign 06/12/2022   Hemoptysis 06/12/2022   Coronary artery disease 06/12/2022    History reviewed. No pertinent surgical history.     Home Medications    Prior to Admission medications   Medication Sig Start Date End Date Taking? Authorizing Provider  amLODipine (NORVASC) 10 MG tablet Take 10 mg by mouth daily. 05/16/22  Yes [provider]  clopidogrel (PLAVIX) 75 MG tablet Take 75 mg by mouth daily. 05/16/22  Yes [provider]  dapagliflozin propanediol (FARXIGA) 10 MG TABS tablet Take 10 mg by mouth every morning. 05/17/22  Yes [provider]  glimepiride (AMARYL) 4 MG tablet Take 4 mg by mouth every morning. 05/16/22  Yes [provider]  hydrochlorothiazide (HYDRODIURIL) 25 MG tablet Take 25 mg by mouth daily. 05/16/22   Yes [provider]  lisinopril (ZESTRIL) 20 MG tablet Take 20 mg by mouth daily. 05/16/22  Yes [provider]  metFORMIN (GLUCOPHAGE-XR) 500 MG 24 hr tablet Take 500 mg by mouth every morning. 05/16/22  Yes [provider]  rosuvastatin (CRESTOR) 40 MG tablet Take 40 mg by mouth daily. 05/16/22  Yes [provider]    Family History History reviewed. No pertinent family history.  Social History Social History   Tobacco Use   Smoking status: Former    Types: Cigarettes   Smokeless tobacco: Current  Vaping Use   Vaping Use: Some days  Substance Use Topics   Alcohol use: Never   Drug use: Yes    Types: Marijuana     Allergies   Patient has no known allergies.   Review of Systems Review of Systems See HPI  Physical Exam Triage Vital Signs ED Triage Vitals  Enc Vitals Group     BP 06/12/22 1308 (!) 146/67     Pulse Rate 06/12/22 1308 (!) 57     Resp 06/12/22 1308 18     Temp 06/12/22 1308 98.1 F (36.7 C)     Temp Source 06/12/22 1308 Oral     SpO2 06/12/22 1308 94 %     Weight 06/12/22 1310 (!) 312 lb (141.5 kg)  Height 06/12/22 1310 '5\' 11"'$  (1.803 m)     Head Circumference --      Peak Flow --      Pain Score 06/12/22 1310 0     Pain Loc --      Pain Edu? --      Excl. in [place]? --    No data found.  Updated Vital Signs BP (!) 146/67 (BP Location: Right Arm)   Pulse (!) 57   Temp 98.1 F (36.7 C) (Oral)   Resp 18   Ht '5\' 11"'$  (1.803 m)   Wt (!) 141.5 kg   SpO2 94%   BMI 43.52 kg/m       Physical Exam Constitutional:      General: He is not in acute distress.    Appearance: Normal appearance. He is well-developed. He is obese.  HENT:     Head: Normocephalic and atraumatic.  Eyes:     Conjunctiva/sclera: Conjunctivae normal.     Pupils: Pupils are equal, round, and reactive to light.  Cardiovascular:     Rate and Rhythm: Normal rate and regular rhythm.     Heart sounds: Normal heart sounds.  Pulmonary:      Effort: Pulmonary effort is normal. No respiratory distress.     Breath sounds: Normal breath sounds. No wheezing or rhonchi.  Abdominal:     General: There is no distension.     Palpations: Abdomen is soft.  Musculoskeletal:        General: Normal range of motion.     Cervical back: Normal range of motion.  Skin:    General: Skin is warm and dry.  Neurological:     Mental Status: He is alert.      UC Treatments / Results  Labs (all labs ordered are listed, but only abnormal results are displayed) Labs Reviewed - No data to display  EKG   Radiology DG Chest 2 View  Result Date: 06/12/2022 CLINICAL DATA:  hemoptysis EXAM: CHEST - 2 VIEW COMPARISON:  December 27, 2012 FINDINGS: The cardiomediastinal silhouette is unchanged in contour. No pleural effusion. No pneumothorax. No acute pleuroparenchymal abnormality. Visualized abdomen is unremarkable. Multilevel degenerative changes of the thoracic spine. IMPRESSION: No acute cardiopulmonary abnormality. Electronically Signed   By: Valentino Saxon M.D.   On: 06/12/2022 13:50    Procedures Procedures (including critical care time)  Medications Ordered in UC Medications - No data to display  Initial Impression / Assessment and Plan / UC Course  I have reviewed the triage vital signs and the nursing notes.  Pertinent labs & imaging results that were available during my care of the patient were reviewed by me and considered in my medical decision making (see chart for details).     Physical exam is normal.  Chest x-ray is normal.  I spoke with Dr. Sallyanne Kuster and cardiology.  I had concern stopping the Plavix given patient's recent cardiac stent placement.  He states that it has been 6 months, therefore he feels it is safe to stop the Plavix.  He states that bleeding may not stop for about a week.  He needs to call for follow-up next week. Final Clinical Impressions(s) / UC Diagnoses   Final diagnoses:  Hemoptysis  Current use of  anticoagulant therapy  Coronary artery disease involving coronary bypass graft of native heart without angina pectoris  Status post primary angioplasty with coronary stent     Discharge Instructions      Stop Plavix Make sure you are  drinking lots of fluids May continue your baby aspirin Call your doctor on Monday for additional follow-up You will need to be seen by your internal medicine doctor or cardiology doctor next week If you have increased bleeding, go directly to an emergency room.  If the bleeding is severe call 911    ED Prescriptions   None    PDMP not reviewed this encounter.   Raylene Everts, MD 06/12/22 [phone]

## 2022-06-14 ENCOUNTER — Encounter: Payer: Self-pay | Admitting: Sports Medicine

## 2022-06-14 ENCOUNTER — Ambulatory Visit: Payer: Self-pay | Admitting: *Deleted

## 2022-06-14 NOTE — Telephone Encounter (Signed)
Noted  

## 2022-06-14 NOTE — Telephone Encounter (Signed)
  Chief Complaint: coughing up blood and taking plavix requesting earlier appt Symptoms: "spitting up blood". Coughing up clumps of blood. Seen in ED 06/12/22. Takes plavix for cardiac stents. Told by ED to stop plavix and f/u with PCP.  Frequency: prior to 06/12/22 Pertinent Negatives: Patient denies chest pain no difficulty breathing no fever reported  Disposition: '[x]'$ ED /'[]'$ Urgent Care (no appt availability in office) / '[]'$ Appointment(In office/virtual)/ '[]'$  [facility]/ '[]'$ Home Care/ '[]'$ Refused Recommended Disposition /'[]'$ [facility]/ '[]'$  Follow-up with PCP Additional Notes:   Recommended ED due to no appt. Patient just seen in ED 06/12/22 and told to contact PCP. Requesting earlier appt than 06/25/22. Please advise . Patient requesting a call back and requesting refill for crestor. Last refill 05/14/22 #90 . Told patient to contact pharmacy for refill. Patient would like a call back.      Reason for Disposition  Taking Coumadin (warfarin) or other strong blood thinner, or known bleeding disorder (e.g., thrombocytopenia)  Answer Assessment - Initial Assessment Questions 1. ONSET: "When did the cough begin?"      Prior to ED visit 06/12/22 2. SEVERITY: "How bad is the cough today?" "Did the blood appear after a coughing spell?"      Couging up blood 3. SPUTUM: "Describe the color of your sputum" (none, dry cough; clear, white, yellow, green)     bloody 4. HEMOPTYSIS: "How much blood?" (flecks, streaks, tablespoons, etc.)     Clumps of blood 5. DIFFICULTY BREATHING: "Are you having difficulty breathing?" If Yes, ask: "How bad is it?" (e.g., mild, moderate, severe)    - MILD: No SOB at rest, mild SOB with walking, speaks normally in sentences, can lie down, no retractions, pulse < 100.    - MODERATE: SOB at rest, SOB with minimal exertion and prefers to sit, cannot lie down flat, speaks in phrases, mild retractions, audible wheezing, pulse 100-120.    - SEVERE: Very SOB at rest,  speaks in single words, struggling to breathe, sitting hunched forward, retractions, pulse > 120      Does not report SOB  6. FEVER: "Do you have a fever?" If Yes, ask: "What is your temperature, how was it measured, and when did it start?"     na 7. CARDIAC HISTORY: "Do you have any history of heart disease?" (e.g., heart attack, congestive heart failure)      Yes see hx stents 8. LUNG HISTORY: "Do you have any history of lung disease?"  (e.g., pulmonary embolus, asthma, emphysema)     na 9. PE RISK FACTORS: "Do you have a history of blood clots?" (or: recent major surgery, recent prolonged travel, bedridden)     na 10. OTHER SYMPTOMS: "Do you have any other symptoms?" (e.g., runny nose, wheezing, chest pain)       Na  11. PREGNANCY: "Is there any chance you are pregnant?" "When was your last menstrual period?"       na 12. TRAVEL: "Have you traveled out of the country in the last month?" (e.g., travel history, exposures)       na  Protocols used: Coughing Up Blood-A-AH

## 2022-06-15 ENCOUNTER — Ambulatory Visit: Payer: Medicare Other | Admitting: Sports Medicine

## 2022-06-21 ENCOUNTER — Other Ambulatory Visit: Payer: Self-pay

## 2022-06-22 ENCOUNTER — Ambulatory Visit: Payer: Self-pay | Admitting: *Deleted

## 2022-06-22 NOTE — Telephone Encounter (Signed)
  Chief Complaint: coughing up blood, requesting CT or MRI to be scheduled prior to appt 06/25/22  Symptoms: coughing up blood every am upon awakening and some throughout the day and at night. Reports more than a tablespoon during the whole day. See ED visit 06/12/22. C/o discomfort in stomach , "feels hard" and bloated at times.  Frequency: prior to 06/12/22 Pertinent Negatives: Patient denies chest pain no difficulty breathing no blood noted in BMs. No dizziness.  Disposition: [] ED /[] Urgent Care (no appt availability in office) / [x] Appointment(In office/virtual)/ []  [facility]/ [] Home Care/ [] Refused Recommended Disposition /[] [facility] [place] [facility]/ []  Follow-up with PCP Additional Notes:  Appt already scheduled for 06/25/22. Patient requesting if MRI or CT can be ordered prior to Friday appt so it can be discussed with PCP.    Reason for Disposition  [1] More than one episode of coughing up blood AND [2] < 1 tablespoon (15 ml) of pure red blood  Answer Assessment - Initial Assessment Questions 1. ONSET: "When did the cough begin?"      Prior to 06/12/22 2. SEVERITY: "How bad is the cough today?" "Did the blood appear after a coughing spell?"      Same coughing up blood upon awakening and some throughout the day and at bedtime 3. SPUTUM: "Describe the color of your sputum" (none, dry cough; clear, white, yellow, green)     Bright red blood  4. HEMOPTYSIS: "How much blood?" (flecks, streaks, tablespoons, etc.)     Tablespoon or more throughout the day  5. DIFFICULTY BREATHING: "Are you having difficulty breathing?" If Yes, ask: "How bad is it?" (e.g., mild, moderate, severe)    - MILD: No SOB at rest, mild SOB with walking, speaks normally in sentences, can lie down, no retractions, pulse < 100.    - MODERATE: SOB at rest, SOB with minimal exertion and prefers to sit, cannot lie down flat, speaks in phrases, mild retractions, audible wheezing, pulse 100-120.    - SEVERE: Very SOB at  rest, speaks in single words, struggling to breathe, sitting hunched forward, retractions, pulse > 120      Denies  6. FEVER: "Do you have a fever?" If Yes, ask: "What is your temperature, how was it measured, and when did it start?"     na 7. CARDIAC HISTORY: "Do you have any history of heart disease?" (e.g., heart attack, congestive heart failure)      na 8. LUNG HISTORY: "Do you have any history of lung disease?"  (e.g., pulmonary embolus, asthma, emphysema)     na 9. PE RISK FACTORS: "Do you have a history of blood clots?" (or: recent major surgery, recent prolonged travel, bedridden)     Was on plavix and now stopped x 1 week and 1/2 10. OTHER SYMPTOMS: "Do you have any other symptoms?" (e.g., runny nose, wheezing, chest pain)       Na  11. PREGNANCY: "Is there any chance you are pregnant?" "When was your last menstrual period?"       na 12. TRAVEL: "Have you traveled out of the country in the last month?" (e.g., travel history, exposures)       na  Protocols used: Coughing Up Blood-A-AH

## 2022-06-25 ENCOUNTER — Ambulatory Visit: Payer: Medicare Other | Attending: Internal Medicine | Admitting: Internal Medicine

## 2022-06-25 ENCOUNTER — Encounter: Payer: Self-pay | Admitting: Internal Medicine

## 2022-06-25 VITALS — BP 140/60 | HR 58 | Temp 97.8°F | Ht 71.0 in | Wt 312.0 lb

## 2022-06-25 DIAGNOSIS — R042 Hemoptysis: Secondary | ICD-10-CM | POA: Insufficient documentation

## 2022-06-25 DIAGNOSIS — Z7902 Long term (current) use of antithrombotics/antiplatelets: Secondary | ICD-10-CM | POA: Diagnosis not present

## 2022-06-25 DIAGNOSIS — Z87891 Personal history of nicotine dependence: Secondary | ICD-10-CM | POA: Diagnosis not present

## 2022-06-25 DIAGNOSIS — Z79899 Other long term (current) drug therapy: Secondary | ICD-10-CM | POA: Insufficient documentation

## 2022-06-25 DIAGNOSIS — R0902 Hypoxemia: Secondary | ICD-10-CM | POA: Insufficient documentation

## 2022-06-25 DIAGNOSIS — Z0000 Encounter for general adult medical examination without abnormal findings: Secondary | ICD-10-CM | POA: Diagnosis present

## 2022-06-25 DIAGNOSIS — E1136 Type 2 diabetes mellitus with diabetic cataract: Secondary | ICD-10-CM | POA: Insufficient documentation

## 2022-06-25 DIAGNOSIS — E1159 Type 2 diabetes mellitus with other circulatory complications: Secondary | ICD-10-CM | POA: Diagnosis not present

## 2022-06-25 DIAGNOSIS — I10 Essential (primary) hypertension: Secondary | ICD-10-CM | POA: Insufficient documentation

## 2022-06-25 DIAGNOSIS — Z7182 Exercise counseling: Secondary | ICD-10-CM | POA: Insufficient documentation

## 2022-06-25 DIAGNOSIS — E1151 Type 2 diabetes mellitus with diabetic peripheral angiopathy without gangrene: Secondary | ICD-10-CM | POA: Insufficient documentation

## 2022-06-25 DIAGNOSIS — I152 Hypertension secondary to endocrine disorders: Secondary | ICD-10-CM

## 2022-06-25 DIAGNOSIS — Z7189 Other specified counseling: Secondary | ICD-10-CM

## 2022-06-25 NOTE — Patient Instructions (Signed)
The CAT scan of your chest has been scheduled for Monday at [facility] at 4:15 p.m.  We have referred you to a lung specialist call pulmonologist.  If you execute a living will/healthcare power of attorney, please bring a copy for our records.

## 2022-06-25 NOTE — Progress Notes (Unsigned)
Subjective:   Luis Larson is a 67 y.o. male who presents for a Welcome to Medicare exam.  Pt with hx of HTN, DM, HL, former tob dep (quit 04/2020), ED, underdeveloped pectoralis muscle right side, OA knees and shoulders.    Review of Systems: Lung:  reports coughing up blood for about 2-3 wks.  Seen in ER for same 06/12/2022.  Advised to stop Plavix which he did; still on Baby ASA.  Hemoptysis has persisted.  No SOB, fever, recent long distance travel, swelling in the legs.  Feels drained.  Quit smoking 04/2020. Pt has always had low nl O2 on Pox here in the office.  Denies loud snoring, or morning HA.  Wakes refresh. GI:  sometimes he feels a floating knot in RUQ abdomen. Feels it with certain movements like bending down.  CVS:  BP elev today.  Took meds already today.  Last check BP 2 wks ago.  Does not recall readings       Objective:    Today's Vitals   06/25/22 0956  BP: (!) 140/60  Pulse: (!) 58  Temp: 97.8 F (36.6 C)  TempSrc: Oral  SpO2: 92%  Weight: (!) 312 lb (141.5 kg)  Height: 5\' 11"  (1.803 m)   Body mass index is 43.52 kg/m. General: Moderately obese Caucasian male in NAD Chest: Clear to auscultation bilaterally CVS: Regular rate and rhythm. Extremities: No edema.  Positive varicose veins. Pox 93% at rest.  I personally ambulated him in the hall.  Pulse ox ranged from 98 to 93%.  Patient did not appear dyspneic. Medications Outpatient Encounter Medications as of 06/25/2022  Medication Sig   albuterol (VENTOLIN HFA) 108 (90 Base) MCG/ACT inhaler Inhale 2 puffs into the lungs every 6 (six) hours as needed for wheezing or shortness of breath.   amLODipine (NORVASC) 10 MG tablet Take 1 tablet (10 mg total) by mouth daily.   aspirin EC 81 MG tablet Take 1 tablet (81 mg total) by mouth daily.   budesonide-formoterol (SYMBICORT) 80-4.5 MCG/ACT inhaler Inhale 2 puffs into the lungs 2 (two) times daily.   dapagliflozin propanediol (FARXIGA) 10 MG TABS tablet Take 1 tablet (10  mg total) by mouth daily before breakfast.   glimepiride (AMARYL) 4 MG tablet Take 1 tablet (4 mg total) by mouth daily with breakfast.   hydrochlorothiazide (HYDRODIURIL) 25 MG tablet Take 1 tablet (25 mg total) by mouth daily.   lisinopril (ZESTRIL) 20 MG tablet Take 1 tablet (20 mg total) by mouth daily.   metFORMIN (GLUCOPHAGE-XR) 500 MG 24 hr tablet Take 1 tablet (500 mg total) by mouth daily with breakfast.   metFORMIN (GLUCOPHAGE-XR) 500 MG 24 hr tablet Take 500 mg by mouth every morning.   nitroGLYCERIN (NITROSTAT) 0.4 MG SL tablet Place 0.4 mg under the tongue every 5 (five) minutes as needed for chest pain.   rosuvastatin (CRESTOR) 40 MG tablet Take 1 tablet (40 mg total) by mouth daily.   sildenafil (VIAGRA) 100 MG tablet TAKE 1/2 TO 1 TAB BY MOUTH 1 HOUR PRIOR TO INTERCOURSE AS NEEDED   traZODone (DESYREL) 50 MG tablet TAKE 0.5-1 TABLETS (25-50 MG TOTAL) BY MOUTH AT BEDTIME AS NEEDED FOR SLEEP.   [DISCONTINUED] clopidogrel (PLAVIX) 75 MG tablet Take 1 tablet (75 mg total) by mouth daily. (Patient not taking: Reported on 06/25/2022)   No facility-administered encounter medications on file as of 06/25/2022.     History: Past Medical History:  Diagnosis Date   Acute DM type 2 causing complication (  [place]) 06/12/2022   Chronic pain of both shoulders 03/19/2019   Colon cancer screening 10/04/2013   Coronary artery disease 06/12/2022   Coronary artery disease involving native coronary artery of native heart without angina pectoris 01/21/2022   Diabetes ([place]) 12/22/2012   Diabetes mellitus without complication ([place])    Dyspnea on exertion 12/09/2021   Essential hypertension 12/22/2012   Essential hypertension, benign 12/22/2012   Former smoker 08/26/2020   Hemoptysis 06/12/2022   Hyperlipidemia 12/16/2017   Influenza vaccine needed 03/13/2020   Insomnia 03/13/2020   Morbid obesity ([place]) 07/24/2019   Neuropathy, arm, 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   Other male erectile dysfunction 10/04/2013    PAD (peripheral artery disease) ([place]) 02/09/2021   Primary osteoarthritis of both shoulders 07/24/2019   Type II or unspecified type diabetes mellitus without mention of complication, uncontrolled 10/17/2012   Unstable angina ([place])    Past Surgical History:  Procedure Laterality Date   CORONARY STENT INTERVENTION N/A 12/17/2021   Procedure: CORONARY STENT INTERVENTION;  Surgeon: Burnell Blanks, MD;  Location: [facility];  Service: Cardiovascular;  Laterality: N/A;   INTRAVASCULAR PRESSURE WIRE/FFR STUDY N/A 12/17/2021   Procedure: INTRAVASCULAR PRESSURE WIRE/FFR STUDY;  Surgeon: Burnell Blanks, MD;  Location: [facility];  Service: Cardiovascular;  Laterality: N/A;   LEFT HEART CATH AND CORONARY ANGIOGRAPHY N/A 12/17/2021   Procedure: LEFT HEART CATH AND CORONARY ANGIOGRAPHY;  Surgeon: Burnell Blanks, MD;  Location: [facility];  Service: Cardiovascular;  Laterality: N/A;    Family History  Problem Relation Age of Onset   Diabetes Mother    Heart disease Mother    Heart disease Father    Social History   Occupational History   Not on file  Tobacco Use   Smoking status: Former    Packs/day: 2    Types: Cigarettes    Quit date: 04/2020    Years since quitting: 2.2   Smokeless tobacco: Current  Vaping Use   Vaping Use: Some days  Substance and Sexual Activity   Alcohol use: Never   Drug use: Yes    Types: Marijuana   Sexual activity: Yes   Tobacco Counseling Ready to quit: Not Answered Counseling given: Not Answered   Immunizations and Health Maintenance Immunization History  Administered Date(s) Administered   Influenza Whole 03/23/2019   Influenza,inj,Quad PF,6+ Mos 02/21/2013, 12/16/2017, 03/13/2020, 02/09/2021, 01/21/2022   Moderna Sars-Covid-2 Vaccination 08/11/2019, 09/08/2019, 01/24/2020   PNEUMOCOCCAL CONJUGATE-20 02/09/2021   Pneumococcal Polysaccharide-23 09/16/2017   Tdap 09/16/2017   Zoster Recombinat (Shingrix)  09/03/2021, 01/21/2022   Health Maintenance Due  Topic Date Due   FOOT EXAM  10/09/2021   COVID-19 Vaccine (4 - 2023-24 season) 12/11/2021  Declines COVID booster  Activities of Daily Living    06/25/2022    9:49 AM 12/17/2021    5:45 AM  In your present state of health, do you have any difficulty performing the following activities:  Hearing? 1   Vision? 1   Difficulty concentrating or making decisions? 0   Walking or climbing stairs? 0 0  Dressing or bathing? 0   Doing errands, shopping? 0   Preparing Food and eating ? N   Using the Toilet? N   In the past six months, have you accidently leaked urine? N   Do you have problems with loss of bowel control? N   Managing your Medications? N   Managing your Finances? N   Housekeeping or managing your Housekeeping? N  Physical Exam  (optional), or other factors deemed appropriate based on the beneficiary's medical and social history and current clinical standards.  Advanced Directives: Does Patient Have a Medical Advance Directive?: No Would patient like information on creating a medical advance directive?: Yes (MAU/Ambulatory/Procedural Areas - Information given)    Assessment:    This is a routine wellness  examination for this patient .   Vision/Hearing screen Vision Screening   Right eye Left eye Both eyes  Without correction 20/50 20/50 20/50   With correction     Had a formal eye exam 2 months ago.  Has cataracts.  Prescribed glasses for reading and to decrease glare at nights when driving.  Dietary issues and exercise activities discussed:   Has cut back on portion sizes.  Eating more grill foods, cut back on sweeta. Walking 2x/wk in his neighborhood with his GF   Goals      Set My Weight Loss Goal     Wants to lose 40 to 50 pounds by the end of the year.       Depression Screen    06/25/2022    9:48 AM 06/25/2022    9:45 AM 05/13/2022    4:32 PM 05/13/2022    4:31 PM  PHQ 2/9 Scores  PHQ - 2 Score 0 0 0 0   PHQ- 9 Score 0 0 0      Fall Risk    06/25/2022    9:45 AM  [place] in the past year? 0  Number falls in past yr: 0  Injury with Fall? 0  Risk for fall due to : No Fall Risks    Cognitive Function    06/25/2022    9:50 AM  MMSE - Mini Mental State Exam  Orientation to time 5  Orientation to Place 5  Registration 3  Attention/ Calculation 4  Recall 3  Language- name 2 objects 2  Language- repeat 1  Language- follow 3 step command 3  Language- read & follow direction 1  Write a sentence 1  Copy design 1  Total score 29        Patient Care Team: Ladell Pier, MD as PCP - General (Internal Medicine) Revankar, Reita Cliche, MD as PCP - Cardiology (Cardiology)     Plan:    1. Encounter for Medicare annual wellness exam   2. Hypertension associated with type 2 diabetes mellitus (HCC) Systolic blood pressure above goal today. No changes made.  Continue amlodipine 10 mg daily, lisinopril 20 mg daily and HCTZ.  Advised to record blood pressure readings with goal being 130/80 or lower.  Bring them in on next visit. - Basic Metabolic Panel  3. Advance directive discussed with patient Discussed advanced directive with him including what is a living will and healthcare power of attorney.  Given information packet with pharmacy and it.  Should he execute a living will or healthcare power of attorney, he should bring 1 for high records.  4. Hemoptysis Questionable etiology.  Patient has always had low end above 90 oxygen level on pulse ox in the office which has not changed.  He denies any significant dyspnea on exertion with this to suggest PE.  Nonetheless we will check a D-dimer.  CT of the chest ordered.  Will try to get him in with pulmonary as soon as possible.  Continue to stay off Plavix. - Ambulatory referral to Pulmonology - CBC - CT Chest W Contrast - D-dimer, quantitative  5.  Hypoxia See #4 above. - CT Chest W Contrast  I have personally reviewed  and noted the following in the patient's chart:   Medical and social history Use of alcohol, tobacco or illicit drugs  Current medications and supplements Functional ability and status Nutritional status Physical activity Advanced directives List of other physicians Hospitalizations, surgeries, and ER visits in previous 12 months Vitals Screenings to include cognitive, depression, and falls Referrals and appointments  In addition, I have reviewed and discussed with patient certain preventive protocols, quality metrics, and best practice recommendations. A written personalized care plan for preventive services as well as general preventive health recommendations were provided to patient.    Karle Plumber, MD 06/26/2022

## 2022-06-26 ENCOUNTER — Encounter: Payer: Self-pay | Admitting: Internal Medicine

## 2022-06-26 LAB — CBC
Hematocrit: 44.7 % (ref 37.5–51.0)
Hemoglobin: 14.8 g/dL (ref 13.0–17.7)
MCH: 27.9 pg (ref 26.6–33.0)
MCHC: 33.1 g/dL (ref 31.5–35.7)
MCV: 84 fL (ref 79–97)
Platelets: 227 10*3/uL (ref 150–450)
RBC: 5.31 x10E6/uL (ref 4.14–5.80)
RDW: 14.2 % (ref 11.6–15.4)
WBC: 7.3 10*3/uL (ref 3.4–10.8)

## 2022-06-26 LAB — BASIC METABOLIC PANEL
BUN/Creatinine Ratio: 17 (ref 10–24)
BUN: 15 mg/dL (ref 8–27)
CO2: 23 mmol/L (ref 20–29)
Calcium: 9.2 mg/dL (ref 8.6–10.2)
Chloride: 100 mmol/L (ref 96–106)
Creatinine, Ser: 0.86 mg/dL (ref 0.76–1.27)
Glucose: 153 mg/dL — ABNORMAL HIGH (ref 70–99)
Potassium: 4.2 mmol/L (ref 3.5–5.2)
Sodium: 137 mmol/L (ref 134–144)
eGFR: 96 mL/min/{1.73_m2} (ref >59)

## 2022-06-26 LAB — D-DIMER, QUANTITATIVE: D-DIMER: 0.37 mg/L FEU (ref 0.00–0.49)

## 2022-06-28 ENCOUNTER — Telehealth: Payer: Self-pay | Admitting: Internal Medicine

## 2022-06-28 ENCOUNTER — Ambulatory Visit (HOSPITAL_BASED_OUTPATIENT_CLINIC_OR_DEPARTMENT_OTHER)
Admission: RE | Admit: 2022-06-28 | Discharge: 2022-06-28 | Disposition: A | Discharge status: Home / Self Care | Payer: Medicare Other | Source: Ambulatory Visit | Attending: Internal Medicine | Admitting: Internal Medicine

## 2022-06-28 DIAGNOSIS — R042 Hemoptysis: Secondary | ICD-10-CM | POA: Insufficient documentation

## 2022-06-28 DIAGNOSIS — R0902 Hypoxemia: Secondary | ICD-10-CM | POA: Insufficient documentation

## 2022-06-28 MED ORDER — IOHEXOL 300 MG/ML  SOLN
80.0000 mL | Freq: Once | INTRAMUSCULAR | Status: AC | PRN
  Administered 2022-06-28: 80 mL via INTRAVENOUS

## 2022-06-28 NOTE — Telephone Encounter (Signed)
Phone call placed to patient this evening to go over the results of the CAT scan of the lungs.  Advised patient that no specific findings seen to explain the hemoptysis.  It did show some inflammation in the upper lungs associated with smoking.  Also showed some emphysematous changes and aortic and coronary atherosclerosis.  Patient reports he has not coughed up blood in the past 3 days.  I told him that I have requested that our referral coordinator to try to get him in with the pulmonologist as soon as possible.  Patient expressed understanding.  All questions were answered.

## 2022-07-01 ENCOUNTER — Encounter: Payer: Self-pay | Admitting: Cardiology

## 2022-07-01 ENCOUNTER — Other Ambulatory Visit: Payer: Self-pay | Admitting: Internal Medicine

## 2022-07-01 ENCOUNTER — Ambulatory Visit: Payer: Medicare Other | Attending: Cardiology | Admitting: Cardiology

## 2022-07-01 VITALS — BP 146/70 | HR 58 | Ht 71.0 in | Wt 305.0 lb

## 2022-07-01 DIAGNOSIS — E782 Mixed hyperlipidemia: Secondary | ICD-10-CM | POA: Insufficient documentation

## 2022-07-01 DIAGNOSIS — I10 Essential (primary) hypertension: Secondary | ICD-10-CM | POA: Insufficient documentation

## 2022-07-01 DIAGNOSIS — R0600 Dyspnea, unspecified: Secondary | ICD-10-CM

## 2022-07-01 DIAGNOSIS — I2510 Atherosclerotic heart disease of native coronary artery without angina pectoris: Secondary | ICD-10-CM

## 2022-07-01 DIAGNOSIS — E119 Type 2 diabetes mellitus without complications: Secondary | ICD-10-CM | POA: Insufficient documentation

## 2022-07-01 MED ORDER — ALBUTEROL SULFATE HFA 108 (90 BASE) MCG/ACT IN AERS: 2.0000 | INHALATION_SPRAY | Freq: Four times a day (QID) | RESPIRATORY_TRACT | 2 refills | Status: DC | PRN

## 2022-07-01 NOTE — Telephone Encounter (Signed)
Medication Refill - Medication: albuterol (VENTOLIN HFA) 108 (90 Base) MCG/ACT inhaler   Has the patient contacted their pharmacy? Yes.   (Agent: If no, request that the patient contact the pharmacy for the refill. If patient does not wish to contact the pharmacy document the reason why and proceed with request.) (Agent: If yes, when and what did the pharmacy advise?)  Preferred Pharmacy (with phone number or street name):  [id number] - [place], [place] - [address] [place]  [address] [place] [place] [address]  Phone: [phone] Fax: [phone]   Has the patient been seen for an appointment in the last year OR does the patient have an upcoming appointment? Yes.    Agent: Please be advised that RX refills may take up to 3 business days. We ask that you follow-up with your pharmacy.

## 2022-07-01 NOTE — Telephone Encounter (Signed)
Requested Prescriptions  Pending Prescriptions Disp Refills   albuterol (VENTOLIN HFA) 108 (90 Base) MCG/ACT inhaler 8.5 g 2    Sig: Inhale 2 puffs into the lungs every 6 (six) hours as needed for wheezing or shortness of breath.     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   [place] Bostic, Neoma Laming B, MD   1 month ago Type 2 diabetes mellitus with morbid obesity [facility])   [place] Karle Plumber B, MD   5 months ago Type 2 diabetes mellitus with morbid obesity [facility])   [place] Karle Plumber B, MD   10 months ago Type 2 diabetes mellitus with morbid obesity [facility])   [place] Karle Plumber B, MD   1 year ago Type 2 diabetes mellitus with morbid obesity [facility])   [place], MD       Future Appointments             Today Revankar, Reita Cliche, MD [place] at [facility]   In 3 months [place], MD [place]

## 2022-07-01 NOTE — Progress Notes (Signed)
Cardiology Office Note:    Date:  07/01/2022   ID:  Luis Larson, DOB [birth year], MRN [id number]  PCP:  Marcine Matar, MD  Cardiologist:  Garwin Brothers, MD   Referring MD: Everrett Coombe, DO    ASSESSMENT:    1. Coronary artery disease involving native coronary artery of native heart without angina pectoris   2. Essential hypertension   3. Diabetes mellitus without complication (HCC)   4. Mixed hyperlipidemia   5. Morbid obesity (HCC)    PLAN:    In order of problems listed above:  Coronary artery disease: Secondary prevention stressed with the patient.  Importance of compliance with diet medication stressed and vocalized understanding.  He was advised to walk to the best of his ability. Hemoptysis: This was significant according to the patient.  He had these episodes at least 4 to 5 days at a stretch.  Currently this is resolved.  He is planning to undergo endoscopy in the next few days.  I told him to talk to his gastroenterologist and primary care about resuming Plavix if it was okay with them after all these procedures again if felt appropriate. Essential hypertension: Blood pressure stable and diet was emphasized.  Lifestyle modification urged. Diabetes mellitus: Managed by primary care. Morbid obesity: Weight reduction stressed risks of obesity explained he promises to do better with diet and exercise. Patient will be seen in follow-up appointment in 6 months or earlier if the patient has any concerns.    Medication Adjustments/Labs and Tests Ordered: Current medicines are reviewed at length with the patient today.  Concerns regarding medicines are outlined above.  No orders of the defined types were placed in this encounter.  No orders of the defined types were placed in this encounter.    No chief complaint on file.    History of Present Illness:    Luis Larson is a 67 y.o. male.  Patient has past medical history of coronary artery disease, essential  hypertension, mixed dyslipidemia, diabetes mellitus and morbid obesity.  He has undergone coronary stenting.  His Plavix was recently discontinued because he had hemoptysis according to the patient.  He mentions to me that subsequently he has not had any issues with bleeding.  He is under the care of a primary care and planning to undergo endoscopy in the next few days.  At the time of my evaluation, the patient is alert awake oriented and in no distress.  Past Medical History:  Diagnosis Date   Acute DM type 2 causing complication (HCC) 06/12/2022   Chronic pain of both shoulders 03/19/2019   Colon cancer screening 10/04/2013   Coronary artery disease 06/12/2022   Coronary artery disease involving native coronary artery of native heart without angina pectoris 01/21/2022   Diabetes (HCC) 12/22/2012   Diabetes mellitus without complication (HCC)    Dyspnea on exertion 12/09/2021   Essential hypertension 12/22/2012   Essential hypertension, benign 12/22/2012   Former smoker 08/26/2020   Hemoptysis 06/12/2022   Hyperlipidemia 12/16/2017   Influenza vaccine needed 03/13/2020   Insomnia 03/13/2020   Morbid obesity (HCC) 07/24/2019   Neuropathy, arm, 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   Other male erectile dysfunction 10/04/2013   PAD (peripheral artery disease) (HCC) 02/09/2021   Primary osteoarthritis of both shoulders 07/24/2019   Type II or unspecified type diabetes mellitus without mention of complication, uncontrolled 10/17/2012   Unstable angina [facility])     Past Surgical History:  Procedure Laterality Date   CORONARY STENT INTERVENTION N/A 12/17/2021  Procedure: CORONARY STENT INTERVENTION;  Surgeon: Kathleene Hazel, MD;  Location: [facility] INVASIVE CV LAB;  Service: Cardiovascular;  Laterality: N/A;   INTRAVASCULAR PRESSURE WIRE/FFR STUDY N/A 12/17/2021   Procedure: INTRAVASCULAR PRESSURE WIRE/FFR STUDY;  Surgeon: Kathleene Hazel, MD;  Location: [facility] INVASIVE CV LAB;  Service: Cardiovascular;   Laterality: N/A;   LEFT HEART CATH AND CORONARY ANGIOGRAPHY N/A 12/17/2021   Procedure: LEFT HEART CATH AND CORONARY ANGIOGRAPHY;  Surgeon: Kathleene Hazel, MD;  Location: [facility] INVASIVE CV LAB;  Service: Cardiovascular;  Laterality: N/A;    Current Medications: Current Meds  Medication Sig   albuterol (VENTOLIN HFA) 108 (90 Base) MCG/ACT inhaler Inhale 2 puffs into the lungs every 6 (six) hours as needed for wheezing or shortness of breath.   amLODipine (NORVASC) 10 MG tablet Take 1 tablet (10 mg total) by mouth daily.   aspirin EC 81 MG tablet Take 1 tablet (81 mg total) by mouth daily.   budesonide-formoterol (SYMBICORT) 80-4.5 MCG/ACT inhaler Inhale 2 puffs into the lungs 2 (two) times daily.   dapagliflozin propanediol (FARXIGA) 10 MG TABS tablet Take 1 tablet (10 mg total) by mouth daily before breakfast.   glimepiride (AMARYL) 4 MG tablet Take 1 tablet (4 mg total) by mouth daily with breakfast.   hydrochlorothiazide (HYDRODIURIL) 25 MG tablet Take 1 tablet (25 mg total) by mouth daily.   lisinopril (ZESTRIL) 20 MG tablet Take 1 tablet (20 mg total) by mouth daily.   metFORMIN (GLUCOPHAGE-XR) 500 MG 24 hr tablet Take 1 tablet (500 mg total) by mouth daily with breakfast.   metFORMIN (GLUCOPHAGE-XR) 500 MG 24 hr tablet Take 500 mg by mouth every morning.   nitroGLYCERIN (NITROSTAT) 0.4 MG SL tablet Place 0.4 mg under the tongue every 5 (five) minutes as needed for chest pain.   rosuvastatin (CRESTOR) 40 MG tablet Take 1 tablet (40 mg total) by mouth daily.   sildenafil (VIAGRA) 100 MG tablet TAKE 1/2 TO 1 TAB BY MOUTH 1 HOUR PRIOR TO INTERCOURSE AS NEEDED   traZODone (DESYREL) 50 MG tablet TAKE 0.5-1 TABLETS (25-50 MG TOTAL) BY MOUTH AT BEDTIME AS NEEDED FOR SLEEP.     Allergies:   Shellfish allergy   Social History   Socioeconomic History   Marital status: Single    Spouse name: Not on file   Number of children: Not on file   Years of education: Not on file   Highest  education level: Not on file  Occupational History   Not on file  Tobacco Use   Smoking status: Former    Packs/day: 2    Types: Cigarettes    Quit date: 04/2020    Years since quitting: 2.2   Smokeless tobacco: Current  Vaping Use   Vaping Use: Some days  Substance and Sexual Activity   Alcohol use: Never   Drug use: Yes    Types: Marijuana   Sexual activity: Yes  Other Topics Concern   Not on file  Social History Narrative   ** Merged History Encounter **       Social Determinants of Health   Financial Resource Strain: Low Risk  (06/25/2022)   Overall Financial Resource Strain (CARDIA)    Difficulty of Paying Living Expenses: Not hard at all  Food Insecurity: No Food Insecurity (06/25/2022)   Hunger Vital Sign    Worried About Running Out of Food in the Last Year: Never true    Ran Out of Food in the Last Year: Never true  Transportation  Needs: No Transportation Needs (06/25/2022)   PRAPARE - Administrator, Civil Service (Medical): No    Lack of Transportation (Non-Medical): No  Physical Activity: Insufficiently Active (06/25/2022)   Exercise Vital Sign    Days of Exercise per Week: 2 days    Minutes of Exercise per Session: 30 min  Stress: No Stress Concern Present (06/25/2022)   [employer] of Occupational Health - Occupational Stress Questionnaire    Feeling of Stress : Not at all  Social Connections: Moderately Integrated (06/25/2022)   Social Connection and Isolation Panel [NHANES]    Frequency of Communication with Friends and Family: More than three times a week    Frequency of Social Gatherings with Friends and Family: Once a week    Attends Religious Services: More than 4 times per year    Active Member of [employer] or Organizations: No    Attends Banker Meetings: Never    Marital Status: Living with partner     Family History: The patient's family history includes Diabetes in his mother; Heart disease in his father and  mother.  ROS:   Please see the history of present illness.    All other systems reviewed and are negative.  EKGs/Labs/Other Studies Reviewed:    The following studies were reviewed today: I discussed my findings with the patient at length.   Recent Labs: 09/03/2021: BNP 38.1 01/19/2022: ALT 19 06/25/2022: BUN 15; Creatinine, Ser 0.86; Hemoglobin 14.8; Platelets 227; Potassium 4.2; Sodium 137  Recent Lipid Panel    Component Value Date/Time   CHOL 112 01/19/2022 1030   TRIG 91 01/19/2022 1030   HDL 41 01/19/2022 1030   CHOLHDL 2.7 01/19/2022 1030   CHOLHDL 4.1 09/16/2014 1744   VLDL 22 09/16/2014 1744   LDLCALC 53 01/19/2022 1030    Physical Exam:    VS:  BP (!) 146/70   Pulse (!) 58   Ht 5\' 11"  (1.803 m)   Wt (!) 305 lb 0.6 oz (138.4 kg)   SpO2 94%   BMI 42.54 kg/m     Wt Readings from Last 3 Encounters:  07/01/22 (!) 305 lb 0.6 oz (138.4 kg)  06/25/22 (!) 312 lb (141.5 kg)  06/12/22 (!) 312 lb (141.5 kg)     GEN: Patient is in no acute distress HEENT: Normal NECK: No JVD; No carotid bruits LYMPHATICS: No lymphadenopathy CARDIAC: Hear sounds regular, 2/6 systolic murmur at the apex. RESPIRATORY:  Clear to auscultation without rales, wheezing or rhonchi  ABDOMEN: Soft, non-tender, non-distended MUSCULOSKELETAL:  No edema; No deformity  SKIN: Warm and dry NEUROLOGIC:  Alert and oriented x 3 PSYCHIATRIC:  Normal affect   Signed, Garwin Brothers, MD  07/01/2022 3:30 PM    [facility]

## 2022-07-01 NOTE — Patient Instructions (Signed)
Medication Instructions:  Your physician recommends that you continue on your current medications as directed. Please refer to the Current Medication list given to you today.  *If you need a refill on your cardiac medications before your next appointment, please call your pharmacy*   Lab Work: None ordered If you have labs (blood work) drawn today and your tests are completely normal, you will receive your results only by: MyChart Message (if you have MyChart) OR A paper copy in the mail If you have any lab test that is abnormal or we need to change your treatment, we will call you to review the results.   Testing/Procedures: None ordered   Follow-Up: At [facility], you and your health needs are our priority.  As part of our continuing mission to provide you with exceptional heart care, we have created designated Provider Care Teams.  These Care Teams include your primary Cardiologist (physician) and Advanced Practice Providers (APPs -  Physician Assistants and Nurse Practitioners) who all work together to provide you with the care you need, when you need it.  We recommend signing up for the patient portal called "MyChart".  Sign up information is provided on this After Visit Summary.  MyChart is used to connect with patients for Virtual Visits (Telemedicine).  Patients are able to view lab/test results, encounter notes, upcoming appointments, etc.  Non-urgent messages can be sent to your provider as well.   To learn more about what you can do with MyChart, go to [email or web address].    Your next appointment:   9 month(s)  The format for your next appointment:   In Person  Provider:   Rajan Revankar, MD    Other Instructions none  Important Information About Sugar       

## 2022-07-04 ENCOUNTER — Ambulatory Visit
Admission: RE | Admit: 2022-07-04 | Discharge: 2022-07-04 | Disposition: A | Discharge status: Home / Self Care | Payer: Medicare Other | Source: Ambulatory Visit | Attending: Sports Medicine | Admitting: Sports Medicine

## 2022-07-04 DIAGNOSIS — G8929 Other chronic pain: Secondary | ICD-10-CM

## 2022-07-06 ENCOUNTER — Telehealth: Payer: Self-pay | Admitting: Internal Medicine

## 2022-07-06 NOTE — Telephone Encounter (Signed)
Copied from [place] [id number]. Topic: General - Other >> Jul 06, 2022 10:28 AM Carrielelia G wrote: Reason for CRM: patient has questions regarding a discussion pt had with Dr Wynetta Emery and possibly being referred to an Endo doctor. Please advise

## 2022-07-12 ENCOUNTER — Institutional Professional Consult (permissible substitution): Payer: Medicare Other | Admitting: Pulmonary Disease

## 2022-07-14 ENCOUNTER — Ambulatory Visit: Payer: Self-pay | Admitting: *Deleted

## 2022-07-14 NOTE — Telephone Encounter (Signed)
  Chief Complaint: Coughing up blood Symptoms: less but still coughing up blood Frequency: NA Pertinent Negatives: Patient denies NA Disposition: [] ED /[] Urgent Care (no appt availability in office) / [] Appointment(In office/virtual)/ []  [place] [facility]/ [] Home Care/ [] Refused Recommended Disposition /[] [place] [facility]/ []  Follow-up with PCP Additional Notes: Incomplete triage. Pt states he cancelled appt with pulmonologist. Was for 07/12/22. States has started again off and on, less amounts. Attempted to triage, pt put S/O wife, sister, did not ID herself, on line.  She was asking who would do the endoscopy. Attempted to triage,  pt states , "It's ok, I'll call and reschedule with pulmonologist.' Pt hung up.

## 2022-07-14 NOTE — Telephone Encounter (Signed)
Answer Assessment - Initial Assessment Questions 1. ONSET: "When did the cough begin?"       2. SEVERITY: "How bad is the cough today?" "Did the blood appear after a coughing spell?"       3. SPUTUM: "Describe the color of your sputum" (none, dry cough; clear, white, yellow, green)      4. HEMOPTYSIS: "How much blood?" (flecks, streaks, tablespoons, etc.)     Less than 1/2 tablespoon 5. DIFFICULTY BREATHING: "Are you having difficulty breathing?" If Yes, ask: "How bad is it?" (e.g., mild, moderate, severe)    - MILD: No SOB at rest, mild SOB with walking, speaks normally in sentences, can lie down, no retractions, pulse < 100.    - MODERATE: SOB at rest, SOB with minimal exertion and prefers to sit, cannot lie down flat, speaks in phrases, mild retractions, audible wheezing, pulse 100-120.    - SEVERE: Very SOB at rest, speaks in single words, struggling to breathe, sitting hunched forward, retractions, pulse > 120      *No Answer* 6. FEVER: "Do you have a fever?" If Yes, ask: "What is your temperature, how was it measured, and when did it start?"     *No Answer* 7. CARDIAC HISTORY: "Do you have any history of heart disease?" (e.g., heart attack, congestive heart failure)      *No Answer* 8. LUNG HISTORY: "Do you have any history of lung disease?"  (e.g., pulmonary embolus, asthma, emphysema)     *No Answer* 9. PE RISK FACTORS: "Do you have a history of blood clots?" (or: recent major surgery, recent prolonged travel, bedridden)     *No Answer* 10. OTHER SYMPTOMS: "Do you have any other symptoms?" (e.g., runny nose, wheezing, chest pain)       *No Answer* 11. PREGNANCY: "Is there any chance you are pregnant?" "When was your last menstrual period?"       *No Answer* 12. TRAVEL: "Have you traveled out of the country in the last month?" (e.g., travel history, exposures)       *No Answer*  Protocols used: Coughing Up Blood-A-AH

## 2022-07-14 NOTE — Telephone Encounter (Signed)
Called LVM to call back

## 2022-07-15 NOTE — Telephone Encounter (Signed)
Patient was called Denies worsening SOB or hemotysis at this time. States he has enough medication in inhalers. Denies pain. States he take it easy over the weekend, will go to ED over weekend for worsening sxs.   Has rescheduled apt with Pulmonology in 4/19.

## 2022-07-19 ENCOUNTER — Encounter: Payer: Self-pay | Admitting: Sports Medicine

## 2022-07-19 ENCOUNTER — Ambulatory Visit (INDEPENDENT_AMBULATORY_CARE_PROVIDER_SITE_OTHER): Payer: Medicare Other | Admitting: Sports Medicine

## 2022-07-19 DIAGNOSIS — M12811 Other specific arthropathies, not elsewhere classified, right shoulder: Secondary | ICD-10-CM

## 2022-07-19 DIAGNOSIS — M75101 Unspecified rotator cuff tear or rupture of right shoulder, not specified as traumatic: Secondary | ICD-10-CM | POA: Diagnosis not present

## 2022-07-19 DIAGNOSIS — G8929 Other chronic pain: Secondary | ICD-10-CM

## 2022-07-19 DIAGNOSIS — M12812 Other specific arthropathies, not elsewhere classified, left shoulder: Secondary | ICD-10-CM

## 2022-07-19 DIAGNOSIS — M25511 Pain in right shoulder: Secondary | ICD-10-CM

## 2022-07-19 MED ORDER — METHYLPREDNISOLONE ACETATE 40 MG/ML IJ SUSP
40.0000 mg | INTRAMUSCULAR | Status: AC | PRN
  Administered 2022-07-19: 40 mg via INTRA_ARTICULAR

## 2022-07-19 MED ORDER — LIDOCAINE HCL 1 % IJ SOLN
2.0000 mL | INTRAMUSCULAR | Status: AC | PRN
  Administered 2022-07-19: 2 mL

## 2022-07-19 MED ORDER — BUPIVACAINE HCL 0.25 % IJ SOLN
2.0000 mL | INTRAMUSCULAR | Status: AC | PRN
  Administered 2022-07-19: 2 mL via INTRA_ARTICULAR

## 2022-07-19 NOTE — Progress Notes (Signed)
Luis Larson - 67 y.o. male MRN [id number]  Date of birth: 12-May-1955  Office Visit Note: Visit Date: 07/19/2022 PCP: Marcine Matar, MD Referred by: Marcine Matar, MD  Subjective: Chief Complaint  Patient presents with   Right Shoulder - Follow-up   HPI: Luis Larson is a pleasant 67 y.o. male who presents today for right shoulder pain with MRI review.  His right shoulder continues to be quite painful.  He is having trouble sleeping on that shoulder, this is keeping him up at night because of the pain.  Does note some mild weakness with that as well.  He is interested in trying a corticosteroid injection.   He has had pain in both shoulders for many years. He states he injured the left shoulder back in 1996 when he tried to catch about 300 pounds labs that fell and he felt a rip in the left arm. 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  His right shoulder pain is worse than his left however, he is also right-hand-dominant.  He has performed rehab on his arm in the past without much improvement, although has not had formalized physical therapy.  Pertinent ROS were reviewed with the patient and found to be negative unless otherwise specified above in HPI.   Assessment & Plan: Visit Diagnoses:  1. Chronic right shoulder pain   2. Nontraumatic tear of supraspinatus tendon, right   3. Rotator cuff arthropathy of both shoulders    Plan: Discussed with Deniece Portela the causes of his bilateral shoulder pain. 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 Discussed with him that there is nothing that can be done surgically for this at this point since it has been so long. The left shoulder bothers him slightly, but the right one is much more bothersome.  We did review his MRI of the right shoulder today which shows global rotator cuff tendinosis with a full-thickness tear of the supraspinatus tendon with  retraction.  He does have some arthritic change in glenohumeral cartilage loss, although I think more of his pain is from the rotator cuff and supraspinatus tear.  Given the degree of his pain, he is interested in trying an injection.  We did proceed with subacromial joint injection today.  We will send a referral for physical therapy to see what sort of improvement he gets from both the injection and dedicated therapy.  He will follow-up in 6 weeks to reevaluate his right shoulder and see how the left one is doing.  If he only has partial relief and more of his pain is emanating from within the shoulder, may consider ultrasound-guided glenohumeral joint injection at that time.  Follow-up: Return in about 6 weeks (around 08/30/2022) for for right shoulder pain.   Meds & Orders: No orders of the defined types were placed in this encounter.   Orders Placed This Encounter  Procedures   Large Joint Inj    Procedures: Large Joint Inj: R subacromial bursa on 07/19/2022 9:14 AM Indications: pain Details: 22 G 1.5 in needle, posterior approach Medications: 2 mL lidocaine 1 %; 2 mL bupivacaine 0.25 %; 40 mg methylPREDNISolone acetate 40 MG/ML Outcome: tolerated well, no immediate complications  Subacromial Joint Injection, Right Shoulder After discussion on risks/benefits/indications, informed verbal consent was obtained. A timeout was then performed. Patient was seated on table in exam room. The patient's shoulder was prepped with betadine and  alcohol swabs and utilizing posterior approach a 22G, 1.5" needle was directed anteriorly and laterally into the patient's subacromial space was injected with 2:2:1 mixture of lidocaine:bupivicaine:depomedrol with appreciation of free-flowing of the injectate into the bursal space. Patient tolerated the procedure well without immediate complications.   Procedure, treatment alternatives, risks and benefits explained, specific risks discussed. Consent was given by the  patient. Immediately prior to procedure a time out was called to verify the correct patient, procedure, equipment, support staff and site/side marked as required. Patient was prepped and draped in the usual sterile fashion.          Clinical History: No specialty comments available.  He reports that he quit smoking about 2 years ago. His smoking use included cigarettes. He smoked an average of 2 packs per day. He uses smokeless tobacco.  Recent Labs    09/03/21 1425 01/21/22 1502 05/13/22 1634  HGBA1C 6.7 7.9* 6.6    Objective:   Vital Signs: There were no vitals taken for this visit.  Physical Exam  Gen: Well-appearing, in no acute distress; non-toxic CV:  Well-perfused. Warm.  Resp: Breathing unlabored on room air; no wheezing. Psych: Fluid speech in conversation; appropriate affect; normal thought process Neuro: Sensation intact throughout. No gross coordination deficits.   Ortho Exam - Right shoulder: No AC joint or bony TTP.  No joint swelling or redness.  There is restriction in active range of motion with forward flexion and abduction, although able to be taken near full range with passive motion.  He is blocked to external rotation to about 65 degrees.  Positive drop arm test, positive empty can, positive pain with resisted ER.   Imaging: MR SHOULDER RIGHT WO CONTRAST CLINICAL DATA:  Chronic right shoulder pain. Decreased range of motion.  EXAM: MRI OF THE RIGHT SHOULDER WITHOUT CONTRAST  TECHNIQUE: Multiplanar, multisequence MR imaging of the shoulder was performed. No intravenous contrast was administered.  COMPARISON:  None Available.  FINDINGS: Rotator cuff: Mild tendinosis of the supraspinatus tendon with a 12 mm full-thickness tear along the anterior aspect and 9 mm of retraction. Moderate tendinosis of the infraspinatus tendon with a interstitial tear at the musculotendinous junction. Teres minor tendon is intact. Mild tendinosis of the subscapularis  tendon with a partial-thickness tear.  Muscles: No muscle atrophy or edema. No intramuscular fluid collection or hematoma.  Biceps Long Head: Complete tear of the proximal long head of the biceps tendon with a stump of tissue attached at the bicipito-labral complex.  Acromioclavicular Joint: Moderate arthropathy of the acromioclavicular joint. No subacromial/subdeltoid bursal fluid.  Glenohumeral Joint: Small joint effusion. Partial-thickness cartilage loss of the glenohumeral joint.  Labrum: Superior labral degeneration. Posterior labral tear with a 2 mm paralabral cyst.  Bones: No fracture or dislocation. No aggressive osseous lesion.  Other: No fluid collection or hematoma.  IMPRESSION: 1. Mild tendinosis of the supraspinatus tendon with a 12 mm full-thickness tear along the anterior aspect and 9 mm of retraction. 2. Moderate tendinosis of the infraspinatus tendon with a interstitial tear at the musculotendinous junction. 3. Mild tendinosis of the subscapularis tendon with a partial-thickness tear. 4. Complete tear of the proximal long head of the biceps tendon with a stump of tissue attached at the bicipito-labral complex.  Electronically Signed   By: Elige Ko M.D.   On: 07/07/2022 07:10    Past Medical/Family/Surgical/Social History: Medications & Allergies reviewed per EMR, new medications updated. Patient Active Problem List   Diagnosis Date Noted   Acute DM type  2 causing complication 06/12/2022   Hyperlipidemia 06/12/2022   Essential hypertension, benign 06/12/2022   Hemoptysis 06/12/2022   Coronary artery disease 06/12/2022   Coronary artery disease involving native coronary artery of native heart without angina pectoris 01/21/2022   Unstable angina    Dyspnea on exertion 12/09/2021   Diabetes mellitus without complication 12/08/2021   Neuropathy, arm, 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   PAD (peripheral artery disease) 02/09/2021   Former smoker 08/26/2020    Influenza vaccine needed 03/13/2020   Insomnia 03/13/2020   Primary osteoarthritis of both shoulders 07/24/2019   Morbid obesity 07/24/2019   Chronic pain of both shoulders 03/19/2019   Hyperlipidemia 12/16/2017   Other male erectile dysfunction 10/04/2013   Colon cancer screening 10/04/2013   Diabetes 12/22/2012   Essential hypertension 12/22/2012   Essential hypertension, benign 12/22/2012   Type II or unspecified type diabetes mellitus without mention of complication, uncontrolled 10/17/2012   Past Medical History:  Diagnosis Date   Acute DM type 2 causing complication (HCC) 06/12/2022   Chronic pain of both shoulders 03/19/2019   Colon cancer screening 10/04/2013   Coronary artery disease 06/12/2022   Coronary artery disease involving native coronary artery of native heart without angina pectoris 01/21/2022   Diabetes (HCC) 12/22/2012   Diabetes mellitus without complication (HCC)    Dyspnea on exertion 12/09/2021   Essential hypertension 12/22/2012   Essential hypertension, benign 12/22/2012   Former smoker 08/26/2020   Hemoptysis 06/12/2022   Hyperlipidemia 12/16/2017   Influenza vaccine needed 03/13/2020   Insomnia 03/13/2020   Morbid obesity (HCC) 07/24/2019   Neuropathy, arm, 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   Other male erectile dysfunction 10/04/2013   PAD (peripheral artery disease) (HCC) 02/09/2021   Primary osteoarthritis of both shoulders 07/24/2019   Type II or unspecified type diabetes mellitus without mention of complication, uncontrolled 10/17/2012   Unstable angina (HCC)    Family History  Problem Relation Age of Onset   Diabetes Mother    Heart disease Mother    Heart disease Father    Past Surgical History:  Procedure Laterality Date   CORONARY PRESSURE/FFR STUDY N/A 12/17/2021   Procedure: INTRAVASCULAR PRESSURE WIRE/FFR STUDY;  Surgeon: Kathleene Hazel, MD;  Location: [facility];  Service: Cardiovascular;  Laterality: N/A;   CORONARY STENT  INTERVENTION N/A 12/17/2021   Procedure: CORONARY STENT INTERVENTION;  Surgeon: Kathleene Hazel, MD;  Location: [facility];  Service: Cardiovascular;  Laterality: N/A;   LEFT HEART CATH AND CORONARY ANGIOGRAPHY N/A 12/17/2021   Procedure: LEFT HEART CATH AND CORONARY ANGIOGRAPHY;  Surgeon: Kathleene Hazel, MD;  Location: [facility];  Service: Cardiovascular;  Laterality: N/A;   Social History   Occupational History   Not on file  Tobacco Use   Smoking status: Former    Packs/day: 2    Types: Cigarettes    Quit date: 04/2020    Years since quitting: 2.2   Smokeless tobacco: Current  Vaping Use   Vaping Use: Some days  Substance and Sexual Activity   Alcohol use: Never   Drug use: Yes    Types: Marijuana   Sexual activity: Yes

## 2022-07-26 ENCOUNTER — Ambulatory Visit: Payer: Medicare Other | Attending: Sports Medicine | Admitting: Physical Therapy

## 2022-07-26 ENCOUNTER — Other Ambulatory Visit: Payer: Self-pay

## 2022-07-26 ENCOUNTER — Encounter: Payer: Self-pay | Admitting: Physical Therapy

## 2022-07-26 DIAGNOSIS — M75101 Unspecified rotator cuff tear or rupture of right shoulder, not specified as traumatic: Secondary | ICD-10-CM | POA: Insufficient documentation

## 2022-07-26 DIAGNOSIS — R29898 Other symptoms and signs involving the musculoskeletal system: Secondary | ICD-10-CM | POA: Insufficient documentation

## 2022-07-26 DIAGNOSIS — G8929 Other chronic pain: Secondary | ICD-10-CM | POA: Diagnosis present

## 2022-07-26 DIAGNOSIS — M6281 Muscle weakness (generalized): Secondary | ICD-10-CM | POA: Insufficient documentation

## 2022-07-26 DIAGNOSIS — M25511 Pain in right shoulder: Secondary | ICD-10-CM | POA: Insufficient documentation

## 2022-07-26 DIAGNOSIS — M25611 Stiffness of right shoulder, not elsewhere classified: Secondary | ICD-10-CM | POA: Diagnosis present

## 2022-07-26 NOTE — Therapy (Signed)
OUTPATIENT PHYSICAL THERAPY SHOULDER EVALUATION   Patient Name: [name] MRN: [id number] DOB:[birth year], 67 y.o., male Today's Date: 07/26/2022  END OF SESSION:  PT End of Session - 07/26/22 1403     Visit Number 1    Number of Visits 9    Date for PT Re-Evaluation 09/20/22    Authorization Type MCR    Authorization Time Period 07/26/22 to 09/20/22    Progress Note Due on Visit 10    PT Start Time 1402    PT Stop Time 1443    PT Time Calculation (min) 41 min    Activity Tolerance Patient tolerated treatment well    Behavior During Therapy [facility] for tasks assessed/performed             Past Medical History:  Diagnosis Date   Acute DM type 2 causing complication 06/12/2022   Chronic pain of both shoulders 03/19/2019   Colon cancer screening 10/04/2013   Coronary artery disease 06/12/2022   Coronary artery disease involving native coronary artery of native heart without angina pectoris 01/21/2022   Diabetes 12/22/2012   Diabetes mellitus without complication    Dyspnea on exertion 12/09/2021   Essential hypertension 12/22/2012   Essential hypertension, benign 12/22/2012   Former smoker 08/26/2020   Hemoptysis 06/12/2022   Hyperlipidemia 12/16/2017   Influenza vaccine needed 03/13/2020   Insomnia 03/13/2020   Morbid obesity 07/24/2019   Neuropathy, arm, 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   Other male erectile dysfunction 10/04/2013   PAD (peripheral artery disease) 02/09/2021   Primary osteoarthritis of both shoulders 07/24/2019   Type II or unspecified type diabetes mellitus without mention of complication, uncontrolled 10/17/2012   Unstable angina    Past Surgical History:  Procedure Laterality Date   CORONARY PRESSURE/FFR STUDY N/A 12/17/2021   Procedure: INTRAVASCULAR PRESSURE WIRE/FFR STUDY;  Surgeon: Kathleene Hazel, MD;  Location: [facility];  Service: Cardiovascular;  Laterality: N/A;   CORONARY STENT INTERVENTION N/A 12/17/2021   Procedure: CORONARY STENT  INTERVENTION;  Surgeon: Kathleene Hazel, MD;  Location: [facility];  Service: Cardiovascular;  Laterality: N/A;   LEFT HEART CATH AND CORONARY ANGIOGRAPHY N/A 12/17/2021   Procedure: LEFT HEART CATH AND CORONARY ANGIOGRAPHY;  Surgeon: Kathleene Hazel, MD;  Location: [facility];  Service: Cardiovascular;  Laterality: N/A;   Patient Active Problem List   Diagnosis Date Noted   Acute DM type 2 causing complication 06/12/2022   Hyperlipidemia 06/12/2022   Essential hypertension, benign 06/12/2022   Hemoptysis 06/12/2022   Coronary artery disease 06/12/2022   Coronary artery disease involving native coronary artery of native heart without angina pectoris 01/21/2022   Unstable angina    Dyspnea on exertion 12/09/2021   Diabetes mellitus without complication 12/08/2021   Neuropathy, arm, 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   PAD (peripheral artery disease) 02/09/2021   Former smoker 08/26/2020   Influenza vaccine needed 03/13/2020   Insomnia 03/13/2020   Primary osteoarthritis of both shoulders 07/24/2019   Morbid obesity 07/24/2019   Chronic pain of both shoulders 03/19/2019   Hyperlipidemia 12/16/2017   Other male erectile dysfunction 10/04/2013   Colon cancer screening 10/04/2013   Diabetes 12/22/2012   Essential hypertension 12/22/2012   Essential hypertension, benign 12/22/2012   Type II or unspecified type diabetes mellitus without mention of complication, uncontrolled 10/17/2012    PCP: Marcine Matar MD   REFERRING PROVIDER: Madelyn Brunner, DO  REFERRING DIAG:  Diagnosis  M75.101 (ICD-10-CM) - Nontraumatic tear of supraspinatus tendon, right  THERAPY DIAG:  Chronic right shoulder pain  Stiffness of right shoulder, not elsewhere classified  Muscle weakness (generalized)  Other symptoms and signs involving the musculoskeletal system  Rationale for Evaluation and Treatment: Rehabilitation  ONSET DATE: 07/19/2022  SUBJECTIVE:                                                                                                                                                                                       SUBJECTIVE STATEMENT:  My shoulder has been torn up for years and years, both of them are torn up. Got a shot in the right one and it feels wonderful but I know this is short lived. Its hard for me to do a lot with the shoulder, I don't have a lot of strength in it anymore. 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    Hand dominance: Right  PERTINENT HISTORY: HPI: Luis Larson is a pleasant 67 y.o. male who presents today for right shoulder pain with MRI review.   His right shoulder continues to be quite painful.  He is having trouble sleeping on that shoulder, this is keeping him up at night because of the pain.  Does note some mild weakness with that as well.  He is interested in trying a corticosteroid injection.    He has had pain in both shoulders for many years. He states he injured the left shoulder back in 1996 when he tried to catch about 300 pounds labs that fell and he felt a rip in the left arm. 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  His right shoulder pain is worse than his left however, he is also right-hand-dominant.  He has performed rehab on his arm in the past without much improvement, although has not had formalized physical therapy.   Pertinent ROS were reviewed with the patient and found to be negative unless otherwise specified above in HPI.    Assessment & Plan: Visit Diagnoses:  1. Chronic right shoulder pain   2. Nontraumatic tear of supraspinatus tendon, right   3. Rotator cuff arthropathy of both shoulders     Plan: Discussed with Deniece Portela the causes of his bilateral shoulder pain. 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 Discussed with him that there is nothing that can be done  surgically for this at this point since it has been so long. The left shoulder bothers him slightly, but the right one is much more bothersome.  We did review his MRI of the right shoulder  today which shows global rotator cuff tendinosis with a full-thickness tear of the supraspinatus tendon with retraction.  He does have some arthritic change in glenohumeral cartilage loss, although I think more of his pain is from the rotator cuff and supraspinatus tear.  Given the degree of his pain, he is interested in trying an injection.  We did proceed with subacromial joint injection today.  We will send a referral for physical therapy to see what sort of improvement he gets from both the injection and dedicated therapy.  He will follow-up in 6 weeks to reevaluate his right shoulder and see how the left one is doing.  If he only has partial relief and more of his pain is emanating from within the shoulder, may consider ultrasound-guided glenohumeral joint injection at that time.   Follow-up: Return in about 6 weeks (around 08/30/2022) for for right shoulder pain.   PAIN:  Are you having pain? Yes: NPRS scale: 3/10; at worst "15/10" Pain location: can go from top of the shoulder down to the elbow  Pain description: stabbing pain  Aggravating factors: lifting arm, any elevation Relieving factors: keeping arm down below shoulder level, not using it a lot   PRECAUTIONS: None  WEIGHT BEARING RESTRICTIONS: No  FALLS:  Has patient fallen in last 6 months? No  LIVING ENVIRONMENT: Lives with: lives with their spouse Lives in: House/apartment Stairs: no STE, no steps inside  Has following equipment at home: None  OCCUPATION: Retired   PLOF: Independent, Independent with basic ADLs, Independent with gait, and Independent with transfers  PATIENT GOALS:be pain free, get better    NEXT MD VISIT: Dr. Shon Baton around May 20th   OBJECTIVE:   DIAGNOSTIC FINDINGS:  IMPRESSION: 1. Mild tendinosis of the  supraspinatus tendon with a 12 mm full-thickness tear along the anterior aspect and 9 mm of retraction. 2. Moderate tendinosis of the infraspinatus tendon with a interstitial tear at the musculotendinous junction. 3. Mild tendinosis of the subscapularis tendon with a partial-thickness tear. 4. Complete tear of the proximal long head of the biceps tendon with a stump of tissue attached at the bicipito-labral complex.  PATIENT SURVEYS:  FOTO 63, predicted 59  COGNITION: Overall cognitive status: Within functional limits for tasks assessed     SENSATION: 4th and 5th fingers on R UE are numb since 2-3 months ago   POSTURE:  Forward head, rounded shoulders, scapulae protacted with rhomboids overstretched   UPPER EXTREMITY ROM:   Active ROM Right eval Left eval  Shoulder flexion 135* 154*  Shoulder extension    Shoulder abduction 122* 174*  Shoulder adduction    Shoulder internal rotation FIR T12 FIR T12   Shoulder external rotation FER upper trap  FER C7  Elbow flexion    Elbow extension    Wrist flexion    Wrist extension    Wrist ulnar deviation    Wrist radial deviation    Wrist pronation    Wrist supination    (Blank rows = not tested)  UPPER EXTREMITY MMT:  MMT Right eval Left eval  Shoulder flexion 4 5  Shoulder extension 4- 4+  Shoulder abduction 4 5  Shoulder adduction    Shoulder internal rotation 4 4+  Shoulder external rotation 4 4+  Middle trapezius    Lower trapezius    Elbow flexion    Elbow extension    Wrist flexion    Wrist extension    Wrist ulnar deviation    Wrist radial deviation  Wrist pronation    Wrist supination    Grip strength (lbs)    (Blank rows = not tested)  SHOULDER SPECIAL TESTS:   JOINT MOBILITY TESTING:    PALPATION:   Anterior R shoulder TTP, R deltoids TTP all heads   TODAY'S TREATMENT:                                                                                                                                          DATE:   Eval  Eval, objective measures + appropriate education  TherEx  UBE L4 x3 min forward/3 min backward  ER isometrics x5 Scap retractions red TB x10     PATIENT EDUCATION: Education details: exam findings, POC, HEP  Person educated: Patient Education method: Programmer, multimedia, Facilities manager, Verbal cues, and Handouts Education comprehension: verbalized understanding, returned demonstration, and needs further education  HOME EXERCISE PROGRAM:  Access Code: [email or web address] URL: [email or web address] Date: 07/26/2022 Prepared by: Nedra Hai  Exercises - Standing Isometric Shoulder External Rotation with Doorway  - 2 x daily - 7 x weekly - 1 sets - 10 reps - 3 hold - Scapular Retraction with Resistance  - 2 x daily - 7 x weekly - 1 sets - 10 reps - 3 hold  ASSESSMENT:  CLINICAL IMPRESSION: Patient is a 67 y.o. M who was seen today for physical therapy evaluation and treatment for chronic R shoulder pain as well as skilled PT care for known supraspinatus tear. He does seem to have a bit of C8 radiculopathy in the R UE as well. Exam reveals significant ROM and strength limitations in RUE, postural limitations, and reduced functional ability/performance with R UE. Will benefit from skilled PT services to address all impairments, reduce pain , and optimize overall level of function moving forward.   OBJECTIVE IMPAIRMENTS: decreased ROM, decreased strength, increased fascial restrictions, impaired flexibility, impaired UE functional use, improper body mechanics, postural dysfunction, obesity, and pain.   ACTIVITY LIMITATIONS: carrying, lifting, dressing, reach over head, and caring for others  PARTICIPATION LIMITATIONS: shopping, community activity, occupation, and yard work  PERSONAL FACTORS: Age, Behavior pattern, Fitness, Past/current experiences, and Time since onset of injury/illness/exacerbation are also affecting patient's functional outcome.   REHAB  POTENTIAL: Good  CLINICAL DECISION MAKING: Stable/uncomplicated  EVALUATION COMPLEXITY: Low   GOALS: Goals reviewed with patient? Yes  SHORT TERM GOALS: Target date: 08/23/2022    Will be compliant with appropriate progressive HEP  Baseline: Goal status: INITIAL  2.  Pain to be no more than 7/10 at worst  Baseline:  Goal status: INITIAL  3.  Will demonstrate better understanding of postural mechanics with functional tasks  Baseline:  Goal status: INITIAL  4.  Scapular winging/protraction at rest to have been resolved, and he will demonstrate minimal scapular winging with UE elevation  Baseline:  Goal status: INITIAL   LONG TERM GOALS: Target date: 09/20/2022    MMT to  have improved by at least 1 grade in all weak groups  Baseline:  Goal status: INITIAL  2.  Will be able to reach overhead to put a plate in a cabinet or turn a fan on/off without increase in pain  Baseline:  Goal status: INITIAL  3.  Pain to be no more than 4/10 at worst  Baseline:  Goal status: INITIAL  4.  FOTO score to have improved to at least 70 Baseline:  Goal status: INITIAL   PLAN:  PT FREQUENCY: 1x/week  PT DURATION: 8 weeks  PLANNED INTERVENTIONS: Therapeutic exercises, Therapeutic activity, Neuromuscular re-education, Patient/Family education, Self Care, Joint mobilization, Aquatic Therapy, Dry Needling, Electrical stimulation, Cryotherapy, Moist heat, Taping, Ultrasound, Ionotophoresis 4mg /ml Dexamethasone, Manual therapy, and Re-evaluation  PLAN FOR NEXT SESSION: ROM as tolerate, would focus strengthening at below shoulder level and then gradually build up due to known mm tear. Coming 1x/week, needs regular HEP updates   Nedra Hai PT DPT PN2

## 2022-07-30 ENCOUNTER — Ambulatory Visit (INDEPENDENT_AMBULATORY_CARE_PROVIDER_SITE_OTHER): Payer: Medicare Other | Admitting: Pulmonary Disease

## 2022-07-30 ENCOUNTER — Encounter: Payer: Self-pay | Admitting: Pulmonary Disease

## 2022-07-30 VITALS — BP 124/68 | HR 52 | Ht 71.0 in | Wt 302.0 lb

## 2022-07-30 DIAGNOSIS — J431 Panlobular emphysema: Secondary | ICD-10-CM | POA: Diagnosis not present

## 2022-07-30 DIAGNOSIS — R042 Hemoptysis: Secondary | ICD-10-CM

## 2022-07-30 NOTE — Patient Instructions (Addendum)
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

## 2022-07-30 NOTE — Progress Notes (Signed)
Luis Larson    [id number]    01/03/1956  Primary Care Physician:Johnson, Binnie Rail, MD  Referring Physician: Marcine Matar, MD [address] [place],  [place] [address]  Chief complaint:   Patient being seen for abnormal CT scan of the chest, did have hemoptysis about a month ago  HPI:  Patient did have about 1 week of hemoptysis, blood streaks with mucus Was seen at the urgent care  Did not have a history of feeling ill prior to onset  Was on aspirin and Plavix following cardiac stenting about September 2023  Denies postnasal drip Denies sinus drainage May have had epistaxis more than 15 years ago but not recently  Quit smoking January 2022 was smoking about 1-1/2 to 2 packs a day  Occasional use of inhalers for shortness of breath.  Not able to quantify activity level but does not usually get short of breath with mild exertion  Denies reflux symptoms  States he has no significant concerns at the present time His breathing feels fine  Outpatient Encounter Medications as of 07/30/2022  Medication Sig   albuterol (VENTOLIN HFA) 108 (90 Base) MCG/ACT inhaler Inhale 2 puffs into the lungs every 6 (six) hours as needed for wheezing or shortness of breath.   amLODipine (NORVASC) 10 MG tablet Take 1 tablet (10 mg total) by mouth daily.   aspirin EC 81 MG tablet Take 1 tablet (81 mg total) by mouth daily.   dapagliflozin propanediol (FARXIGA) 10 MG TABS tablet Take 1 tablet (10 mg total) by mouth daily before breakfast.   glimepiride (AMARYL) 4 MG tablet Take 1 tablet (4 mg total) by mouth daily with breakfast.   hydrochlorothiazide (HYDRODIURIL) 25 MG tablet Take 1 tablet (25 mg total) by mouth daily.   lisinopril (ZESTRIL) 20 MG tablet Take 1 tablet (20 mg total) by mouth daily.   metFORMIN (GLUCOPHAGE-XR) 500 MG 24 hr tablet Take 1 tablet (500 mg total) by mouth daily with breakfast.   metFORMIN (GLUCOPHAGE-XR) 500 MG 24 hr tablet Take 500 mg by  mouth every morning.   nitroGLYCERIN (NITROSTAT) 0.4 MG SL tablet Place 0.4 mg under the tongue every 5 (five) minutes as needed for chest pain.   rosuvastatin (CRESTOR) 40 MG tablet Take 1 tablet (40 mg total) by mouth daily.   sildenafil (VIAGRA) 100 MG tablet TAKE 1/2 TO 1 TAB BY MOUTH 1 HOUR PRIOR TO INTERCOURSE AS NEEDED   traZODone (DESYREL) 50 MG tablet TAKE 0.5-1 TABLETS (25-50 MG TOTAL) BY MOUTH AT BEDTIME AS NEEDED FOR SLEEP.   budesonide-formoterol (SYMBICORT) 80-4.5 MCG/ACT inhaler Inhale 2 puffs into the lungs 2 (two) times daily. (Patient not taking: Reported on 07/30/2022)   No facility-administered encounter medications on file as of 07/30/2022.    Allergies as of 07/30/2022 - Review Complete 07/30/2022  Allergen Reaction Noted   Shellfish allergy Swelling 11/03/2021    Past Medical History:  Diagnosis Date   Acute DM type 2 causing complication 06/12/2022   Chronic pain of both shoulders 03/19/2019   Colon cancer screening 10/04/2013   Coronary artery disease 06/12/2022   Coronary artery disease involving native coronary artery of native heart without angina pectoris 01/21/2022   Diabetes 12/22/2012   Diabetes mellitus without complication    Dyspnea on exertion 12/09/2021   Essential hypertension 12/22/2012   Essential hypertension, benign 12/22/2012   Former smoker 08/26/2020   Hemoptysis 06/12/2022   Hyperlipidemia 12/16/2017   Influenza vaccine needed  03/13/2020   Insomnia 03/13/2020   Morbid obesity 07/24/2019   Neuropathy, arm, 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   Other male erectile dysfunction 10/04/2013   PAD (peripheral artery disease) 02/09/2021   Primary osteoarthritis of both shoulders 07/24/2019   Type II or unspecified type diabetes mellitus without mention of complication, uncontrolled 10/17/2012   Unstable angina     Past Surgical History:  Procedure Laterality Date   CORONARY PRESSURE/FFR STUDY N/A 12/17/2021   Procedure: INTRAVASCULAR PRESSURE WIRE/FFR STUDY;   Surgeon: Kathleene Hazel, MD;  Location: [facility];  Service: Cardiovascular;  Laterality: N/A;   CORONARY STENT INTERVENTION N/A 12/17/2021   Procedure: CORONARY STENT INTERVENTION;  Surgeon: Kathleene Hazel, MD;  Location: [facility];  Service: Cardiovascular;  Laterality: N/A;   LEFT HEART CATH AND CORONARY ANGIOGRAPHY N/A 12/17/2021   Procedure: LEFT HEART CATH AND CORONARY ANGIOGRAPHY;  Surgeon: Kathleene Hazel, MD;  Location: [facility];  Service: Cardiovascular;  Laterality: N/A;    Family History  Problem Relation Age of Onset   Diabetes Mother    Heart disease Mother    Heart disease Father     Social History   Socioeconomic History   Marital status: Single    Spouse name: Not on file   Number of children: Not on file   Years of education: Not on file   Highest education level: Not on file  Occupational History   Not on file  Tobacco Use   Smoking status: Former    Packs/day: 2    Types: Cigarettes    Quit date: 04/2020    Years since quitting: 2.2   Smokeless tobacco: Current  Vaping Use   Vaping Use: Some days  Substance and Sexual Activity   Alcohol use: Never   Drug use: Yes    Types: Marijuana   Sexual activity: Yes  Other Topics Concern   Not on file  Social History Narrative   ** Merged History Encounter **       Social Determinants of Health   Financial Resource Strain: Low Risk  (06/25/2022)   Overall Financial Resource Strain (CARDIA)    Difficulty of Paying Living Expenses: Not hard at all  Food Insecurity: No Food Insecurity (06/25/2022)   Hunger Vital Sign    Worried About Running Out of Food in the Last Year: Never true    Ran Out of Food in the Last Year: Never true  Transportation Needs: No Transportation Needs (06/25/2022)   PRAPARE - Administrator, Civil Service (Medical): No    Lack of Transportation (Non-Medical): No  Physical Activity: Insufficiently Active (06/25/2022)    Exercise Vital Sign    Days of Exercise per Week: 2 days    Minutes of Exercise per Session: 30 min  Stress: No Stress Concern Present (06/25/2022)   [employer] of Occupational Health - Occupational Stress Questionnaire    Feeling of Stress : Not at all  Social Connections: Moderately Integrated (06/25/2022)   Social Connection and Isolation Panel [NHANES]    Frequency of Communication with Friends and Family: More than three times a week    Frequency of Social Gatherings with Friends and Family: Once a week    Attends Religious Services: More than 4 times per year    Active Member of [employer] or Organizations: No    Attends Banker Meetings: Never    Marital Status: Living with partner  Intimate Partner Violence: Not At Risk (06/25/2022)  Humiliation, Afraid, Rape, and Kick questionnaire    Fear of Current or Ex-Partner: No    Emotionally Abused: No    Physically Abused: No    Sexually Abused: No    Review of Systems  Respiratory:  Negative for cough and shortness of breath.     Vitals:   07/30/22 0829  BP: 124/68  Pulse: (!) 52  SpO2: 92%     Physical Exam Constitutional:      Appearance: He is obese.  HENT:     Head: Normocephalic.  Eyes:     General: No scleral icterus.    Pupils: Pupils are equal, round, and reactive to light.  Cardiovascular:     Rate and Rhythm: Normal rate and regular rhythm.     Heart sounds: No murmur heard.    No friction rub.  Pulmonary:     Effort: No respiratory distress.     Breath sounds: No stridor. No wheezing or rhonchi.  Musculoskeletal:     Cervical back: No rigidity or tenderness.  Neurological:     Mental Status: He is alert.  Psychiatric:        Mood and Affect: Mood normal.    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

## 2022-08-03 ENCOUNTER — Ambulatory Visit: Payer: Medicare Other | Admitting: Physical Therapy

## 2022-08-03 ENCOUNTER — Encounter: Payer: Self-pay | Admitting: Physical Therapy

## 2022-08-03 DIAGNOSIS — R29898 Other symptoms and signs involving the musculoskeletal system: Secondary | ICD-10-CM

## 2022-08-03 DIAGNOSIS — M25611 Stiffness of right shoulder, not elsewhere classified: Secondary | ICD-10-CM

## 2022-08-03 DIAGNOSIS — M25511 Pain in right shoulder: Secondary | ICD-10-CM | POA: Diagnosis not present

## 2022-08-03 DIAGNOSIS — G8929 Other chronic pain: Secondary | ICD-10-CM

## 2022-08-03 DIAGNOSIS — M6281 Muscle weakness (generalized): Secondary | ICD-10-CM

## 2022-08-03 NOTE — Therapy (Addendum)
OUTPATIENT PHYSICAL THERAPY   Patient Name: Luis Larson MRN: [id number] DOB:1955/11/12, 67 y.o., male Today's Date: 08/03/2022  END OF SESSION:  PT End of Session - 08/03/22 1525     Visit Number 2    Number of Visits 9    Date for PT Re-Evaluation 09/20/22    Authorization Type MCR    Authorization Time Period 07/26/22 to 09/20/22    Authorization - Visit Number 2    Progress Note Due on Visit 10    PT Start Time 1439    PT Stop Time 1510    PT Time Calculation (min) 31 min    Activity Tolerance Patient tolerated treatment well    Behavior During Therapy [facility] for tasks assessed/performed              Past Medical History:  Diagnosis Date   Acute DM type 2 causing complication 06/12/2022   Chronic pain of both shoulders 03/19/2019   Colon cancer screening 10/04/2013   Coronary artery disease 06/12/2022   Coronary artery disease involving native coronary artery of native heart without angina pectoris 01/21/2022   Diabetes 12/22/2012   Diabetes mellitus without complication    Dyspnea on exertion 12/09/2021   Essential hypertension 12/22/2012   Essential hypertension, benign 12/22/2012   Former smoker 08/26/2020   Hemoptysis 06/12/2022   Hyperlipidemia 12/16/2017   Influenza vaccine needed 03/13/2020   Insomnia 03/13/2020   Morbid obesity 07/24/2019   Neuropathy, arm, 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   Other male erectile dysfunction 10/04/2013   PAD (peripheral artery disease) 02/09/2021   Primary osteoarthritis of both shoulders 07/24/2019   Type II or unspecified type diabetes mellitus without mention of complication, uncontrolled 10/17/2012   Unstable angina    Past Surgical History:  Procedure Laterality Date   CORONARY PRESSURE/FFR STUDY N/A 12/17/2021   Procedure: INTRAVASCULAR PRESSURE WIRE/FFR STUDY;  Surgeon: Kathleene Hazel, MD;  Location: [facility];  Service: Cardiovascular;  Laterality: N/A;   CORONARY STENT INTERVENTION N/A 12/17/2021   Procedure:  CORONARY STENT INTERVENTION;  Surgeon: Kathleene Hazel, MD;  [facility]: [facility];  Service: Cardiovascular;  Laterality: N/A;   LEFT HEART CATH AND CORONARY ANGIOGRAPHY N/A 12/17/2021   Procedure: LEFT HEART CATH AND CORONARY ANGIOGRAPHY;  Surgeon: Kathleene Hazel, MD;  Location: [facility];  Service: Cardiovascular;  Laterality: N/A;   Patient Active Problem List   Diagnosis Date Noted   Acute DM type 2 causing complication 06/12/2022   Hyperlipidemia 06/12/2022   Essential hypertension, benign 06/12/2022   Hemoptysis 06/12/2022   Coronary artery disease 06/12/2022   Coronary artery disease involving native coronary artery of native heart without angina pectoris 01/21/2022   Unstable angina    Dyspnea on exertion 12/09/2021   Diabetes mellitus without complication 12/08/2021   Neuropathy, arm, 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   PAD (peripheral artery disease) 02/09/2021   Former smoker 08/26/2020   Influenza vaccine needed 03/13/2020   Insomnia 03/13/2020   Primary osteoarthritis of both shoulders 07/24/2019   Morbid obesity 07/24/2019   Chronic pain of both shoulders 03/19/2019   Hyperlipidemia 12/16/2017   Other male erectile dysfunction 10/04/2013   Colon cancer screening 10/04/2013   Diabetes 12/22/2012   Essential hypertension 12/22/2012   Essential hypertension, benign 12/22/2012   Type II or unspecified type diabetes mellitus without mention of complication, uncontrolled 10/17/2012    PCP: Marcine Matar MD   REFERRING PROVIDER: Madelyn Brunner, DO  REFERRING DIAG:  Diagnosis  M75.101 (ICD-10-CM) - Nontraumatic tear  of supraspinatus tendon, right    THERAPY DIAG:  Chronic right shoulder pain  Stiffness of right shoulder, not elsewhere classified  Muscle weakness (generalized)  Other symptoms and signs involving the musculoskeletal system  Rationale for Evaluation and Treatment: Rehabilitation  ONSET DATE: 07/19/2022  SUBJECTIVE:                                                                                                                                                                                       SUBJECTIVE STATEMENT:  Pt states he had some pain with initial HEP so he stopped doing the isometrics. States he feels "sore" today   Hand dominance: Right  PERTINENT HISTORY: My shoulder has been torn up for years and years, both of them are torn up. Got a shot in the right one and it feels wonderful but I know this is short lived. Its hard for me to do a lot with the shoulder, I don't have a lot of strength in it anymore. 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    PAIN:  Are you having pain? Yes: NPRS scale: 5/10; at worst "15/10" Pain location: can go from top of the shoulder down to the elbow  Pain description: stabbing pain  Aggravating factors: lifting arm, any elevation Relieving factors: keeping arm down below shoulder level, not using it a lot   PRECAUTIONS: None  WEIGHT BEARING RESTRICTIONS: No  FALLS:  Has patient fallen in last 6 months? No  LIVING ENVIRONMENT: Lives with: lives with their spouse Lives in: House/apartment Stairs: no STE, no steps inside  Has following equipment at home: None  OCCUPATION: Retired   PLOF: Independent, Independent with basic ADLs, Independent with gait, and Independent with transfers  PATIENT GOALS:be pain free, get better    NEXT MD VISIT: Dr. Shon Baton around May 20th   OBJECTIVE:   DIAGNOSTIC FINDINGS:  IMPRESSION: 1. Mild tendinosis of the supraspinatus tendon with a 12 mm full-thickness tear along the anterior aspect and 9 mm of retraction. 2. Moderate tendinosis of the infraspinatus tendon with a interstitial tear at the musculotendinous junction. 3. Mild tendinosis of the subscapularis tendon with a partial-thickness tear. 4. Complete tear of the proximal long head of the biceps tendon with a stump of tissue  attached at the bicipito-labral complex.      SENSATION: 4th and 5th fingers on R UE are numb since 2-3 months ago   POSTURE:  Forward head, rounded shoulders, scapulae protacted with rhomboids overstretched   UPPER EXTREMITY ROM:   Active ROM Right eval Left eval  Shoulder flexion 135* 154*  Shoulder extension  Shoulder abduction 122* 174*  Shoulder adduction    Shoulder internal rotation FIR T12 FIR T12   Shoulder external rotation FER upper trap  FER C7  Elbow flexion    Elbow extension    Wrist flexion    Wrist extension    Wrist ulnar deviation    Wrist radial deviation    Wrist pronation    Wrist supination    (Blank rows = not tested)  UPPER EXTREMITY MMT:  MMT Right eval Left eval  Shoulder flexion 4 5  Shoulder extension 4- 4+  Shoulder abduction 4 5  Shoulder adduction    Shoulder internal rotation 4 4+  Shoulder external rotation 4 4+  Middle trapezius    Lower trapezius    Elbow flexion    Elbow extension    Wrist flexion    Wrist extension    Wrist ulnar deviation    Wrist radial deviation    Wrist pronation    Wrist supination    Grip strength (lbs)    (Blank rows = not tested)     TODAY'S TREATMENT:                                                                                                                                         DATE:  [facility] Adult PT Treatment:                                                DATE: 08/03/22 Therapeutic Exercise: UBE L2 x 4 min alt fwd/bkwd Row 20# x 20 Shoulder extension 15# x 20 Tricep pull down 15# x 20 Bicep curl 15# 2 x 10 Horizontal abd green TB 2 x 10 "Y" red TB  x 10  - pain Serratus wall slide red TB x 15 Wall clock red TB x 10   Eval  Eval, objective measures + appropriate education  TherEx  UBE L4 x3 min forward/3 min backward  ER isometrics x5 Scap retractions red TB x10     PATIENT EDUCATION: Education details: exam findings, POC, HEP  Person educated:  Patient Education method: Programmer, multimedia, Facilities manager, Verbal cues, and Handouts Education comprehension: verbalized understanding, returned demonstration, and needs further education  HOME EXERCISE PROGRAM: Access Code: [email or web address] URL: [email or web address] Date: 08/03/2022 Prepared by: Reggy Eye  Exercises - Standing Bilateral Low Shoulder Row with Anchored Resistance  - 1 x daily - 7 x weekly - 3 sets - 10 reps - Shoulder Extension with Resistance Hands Down  - 1 x daily - 7 x weekly - 3 sets - 10 reps - Standing Shoulder Horizontal Abduction with Anchored Resistance  - 1 x daily - 7 x weekly - 3 sets - 10 reps - Standing Bicep Curls with Resistance  - 1 x daily -  7 x weekly - 3 sets - 10 reps - Standing Tricep Extensions with Resistance  - 1 x daily - 7 x weekly - 3 sets - 10 reps  ASSESSMENT:  CLINICAL IMPRESSION: Pt with some increased pain with standing "Y"s and with wall clock, required frequent rest breaks during these exercises. Pt requires cues for posture and technique throughout session. HEP updated  OBJECTIVE IMPAIRMENTS: decreased ROM, decreased strength, increased fascial restrictions, impaired flexibility, impaired UE functional use, improper body mechanics, postural dysfunction, obesity, and pain.    GOALS: Goals reviewed with patient? Yes  SHORT TERM GOALS: Target date: 08/23/2022    Will be compliant with appropriate progressive HEP  Baseline: Goal status: INITIAL  2.  Pain to be no more than 7/10 at worst  Baseline:  Goal status: INITIAL  3.  Will demonstrate better understanding of postural mechanics with functional tasks  Baseline:  Goal status: INITIAL  4.  Scapular winging/protraction at rest to have been resolved, and he will demonstrate minimal scapular winging with UE elevation  Baseline:  Goal status: INITIAL   LONG TERM GOALS: Target date: 09/20/2022    MMT to have improved by at least 1 grade in all weak groups   Baseline:  Goal status: INITIAL  2.  Will be able to reach overhead to put a plate in a cabinet or turn a fan on/off without increase in pain  Baseline:  Goal status: INITIAL  3.  Pain to be no more than 4/10 at worst  Baseline:  Goal status: INITIAL  4.  FOTO score to have improved to at least 70 Baseline:  Goal status: INITIAL   PLAN:  PT FREQUENCY: 1x/week  PT DURATION: 8 weeks  PLANNED INTERVENTIONS: Therapeutic exercises, Therapeutic activity, Neuromuscular re-education, Patient/Family education, Self Care, Joint mobilization, Aquatic Therapy, Dry Needling, Electrical stimulation, Cryotherapy, Moist heat, Taping, Ultrasound, Ionotophoresis 4mg /ml Dexamethasone, Manual therapy, and Re-evaluation  PLAN FOR NEXT SESSION: postural strength as tolerated. Coming 1x/week, needs regular HEP updates   PHYSICAL THERAPY DISCHARGE SUMMARY  Visits from Start of Care: 2  Current functional level related to goals / functional outcomes: See above   Remaining deficits: See above   Education / Equipment: HEP   Patient agrees to discharge. Patient goals were not met. Patient is being discharged due to not returning since the last visit. Reggy Eye, PT,DPT11/22/2411:29 AM   Reggy Eye, PT,DPT04/23/243:25 PM

## 2022-08-10 ENCOUNTER — Ambulatory Visit: Payer: Medicare Other | Admitting: Physical Therapy

## 2022-08-17 ENCOUNTER — Encounter: Payer: Medicare Other | Admitting: Physical Therapy

## 2022-08-24 ENCOUNTER — Encounter: Payer: Medicare Other | Admitting: Physical Therapy

## 2022-08-31 ENCOUNTER — Ambulatory Visit: Payer: Medicare Other | Admitting: Physical Therapy

## 2022-09-07 ENCOUNTER — Encounter: Payer: Medicare Other | Admitting: Physical Therapy

## 2022-10-22 ENCOUNTER — Encounter: Payer: Self-pay | Admitting: Sports Medicine

## 2022-10-22 ENCOUNTER — Ambulatory Visit (INDEPENDENT_AMBULATORY_CARE_PROVIDER_SITE_OTHER): Payer: Medicare Other | Admitting: Sports Medicine

## 2022-10-22 DIAGNOSIS — M12812 Other specific arthropathies, not elsewhere classified, left shoulder: Secondary | ICD-10-CM

## 2022-10-22 DIAGNOSIS — G8929 Other chronic pain: Secondary | ICD-10-CM

## 2022-10-22 DIAGNOSIS — M25511 Pain in right shoulder: Secondary | ICD-10-CM

## 2022-10-22 DIAGNOSIS — M75101 Unspecified rotator cuff tear or rupture of right shoulder, not specified as traumatic: Secondary | ICD-10-CM

## 2022-10-22 DIAGNOSIS — M12811 Other specific arthropathies, not elsewhere classified, right shoulder: Secondary | ICD-10-CM | POA: Diagnosis not present

## 2022-10-22 MED ORDER — METHYLPREDNISOLONE ACETATE 40 MG/ML IJ SUSP
40.0000 mg | INTRAMUSCULAR | Status: AC | PRN
  Administered 2022-10-22: 40 mg via INTRA_ARTICULAR

## 2022-10-22 MED ORDER — BUPIVACAINE HCL 0.25 % IJ SOLN
2.0000 mL | INTRAMUSCULAR | Status: AC | PRN
  Administered 2022-10-22: 2 mL via INTRA_ARTICULAR

## 2022-10-22 MED ORDER — LIDOCAINE HCL 1 % IJ SOLN
2.0000 mL | INTRAMUSCULAR | Status: AC | PRN
  Administered 2022-10-22: 2 mL

## 2022-10-22 NOTE — Progress Notes (Signed)
Doing ok; states last injection helped a lot and wore off a few weeks ago  Inquiring about another injection  Patient was instructed in 10 minutes of therapeutic exercises for right shoulder to improve strength, ROM and function according to my instructions and plan of care by a Certified Athletic Trainer during the office visit. A customized handout was provided and demonstration of proper technique shown and discussed. Patient did perform exercises and demonstrate understanding through teachback.  All questions discussed and answered.

## 2022-10-22 NOTE — Progress Notes (Signed)
Luis Larson - 67 y.o. male MRN [id number]  Date of birth: Aug 10, 1955  Office Visit Note: Visit Date: 10/22/2022 PCP: Marcine Matar, MD Referred by: Marcine Matar, MD  Subjective: Chief Complaint  Patient presents with   Right Shoulder - Pain   HPI: Luis Larson is a pleasant 67 y.o. male who presents today for acute on chronic right shoulder pain.  Last seen on 07/19/22 - did review MRI of R-shoulder which showed global rotator cuff tendinosis with a full-thickness tear of the supraspinatus tendon with retraction, complete proximal biceps tear and some arthritic change with glenohumeral cartilage loss. Had SAJ injection at that time with good relief of his pain.   Right shoulder is doing okay, about 1 day after the last injection he was able to sleep on the shoulder for the first time in a long time.  Here over the last few weeks it is started to return, but still better than previous.  He did do formalized physical therapy but they have been doing a lot of reaching activities and he feels like this exacerbated the pain. Not interested in surgery at this time.  Pertinent ROS were reviewed with the patient and found to be negative unless otherwise specified above in HPI.   Assessment & Plan: Visit Diagnoses:  1. Nontraumatic tear of supraspinatus tendon, right   2. Chronic right shoulder pain   3. Rotator cuff arthropathy of both shoulders    Plan: Discussed treatment options for his chronic right shoulder pain with global rotator cuff tendinosis and full-thickness supraspinatus and proximal bicep tear.  Through shared decision-making, elected to proceed with subacromial joint injection which patient tolerated well.  He had received excellent relief of his pain from this previously.  We did provide him with a customized handout for rotator cuff strengthening that focuses on keeping the bones of the body.  Athletic trainer Lequita Halt did review these in the room with him today.  He is to  perform these once every other day.  May use ice/heat or Tylenol for any postinjection pain.  He does have some arthritis within the shoulder joint as well as well as a proximal bicep tendon tear, for some reason the subacromial joint injection does not relieve his pain could consider glenohumeral joint injection in the future.  He will follow-up with me in about 3 months, may call or return sooner if any issues arise.  Follow-up: Return in about 3 months (around 01/22/2023), or if symptoms worsen or fail to improve.   Meds & Orders: No orders of the defined types were placed in this encounter.   Orders Placed This Encounter  Procedures   Large Joint Inj     Procedures: Large Joint Inj: R subacromial bursa on 10/22/2022 8:30 AM Indications: pain Details: 22 G 1.5 in needle, posterior approach Medications: 2 mL lidocaine 1 %; 2 mL bupivacaine 0.25 %; 40 mg methylPREDNISolone acetate 40 MG/ML Outcome: tolerated well, no immediate complications  Subacromial Joint Injection, Right Shoulder After discussion on risks/benefits/indications, informed verbal consent was obtained. A timeout was then performed. Patient was seated on table in exam room. The patient's shoulder was prepped with betadine and alcohol swabs and utilizing posterior approach a 22G, 1.5" needle was directed anteriorly and laterally into the patient's subacromial space was injected with 2:2:1 mixture of lidocaine:bupivicaine:depomedrol with appreciation of free-flowing of the injectate into the bursal space. Patient tolerated the procedure well without immediate complications.   Procedure, treatment alternatives, risks and benefits explained, specific  risks discussed. Consent was given by the patient. Immediately prior to procedure a time out was called to verify the correct patient, procedure, equipment, support staff and site/side marked as required. Patient was prepped and draped in the usual sterile fashion.           Clinical History: No specialty comments available.  He reports that he quit smoking about 2 years ago. His smoking use included cigarettes. He uses smokeless tobacco.  Recent Labs    01/21/22 1502 05/13/22 1634  HGBA1C 7.9* 6.6    Objective:   Vital Signs: There were no vitals taken for this visit.  Physical Exam  Gen: Well-appearing, in no acute distress; non-toxic CV: Well-perfused. Warm.  Resp: Breathing unlabored on room air; no wheezing. Psych: Fluid speech in conversation; appropriate affect; normal   thought process Neuro: Sensation intact throughout. No gross coordination deficits.   Ortho Exam - Right shoulder: No AC joint or bony TTP.  Positive pain at Codman's point and mild pain to deep palpation within the bicipital groove.  There is aggravation of pain with endrange forward flexion and abduction but able to be taken fully passively.  Positive drop arm test, positive empty can test, mild pain with resisted ER.   Imaging: MR SHOULDER RIGHT WO CONTRAST CLINICAL DATA:  Chronic right shoulder pain. Decreased range of motion.  EXAM: MRI OF THE RIGHT SHOULDER WITHOUT CONTRAST  TECHNIQUE: Multiplanar, multisequence MR imaging of the shoulder was performed. No intravenous contrast was administered.  COMPARISON:  None Available.  FINDINGS: Rotator cuff: Mild tendinosis of the supraspinatus tendon with a 12 mm full-thickness tear along the anterior aspect and 9 mm of retraction. Moderate tendinosis of the infraspinatus tendon with a interstitial tear at the musculotendinous junction. Teres minor tendon is intact. Mild tendinosis of the subscapularis tendon with a partial-thickness tear.  Muscles: No muscle atrophy or edema. No intramuscular fluid collection or hematoma.  Biceps Long Head: Complete tear of the proximal long head of the biceps tendon with a stump of tissue attached at the bicipito-labral complex.  Acromioclavicular Joint: Moderate  arthropathy of the acromioclavicular joint. No subacromial/subdeltoid bursal fluid.  Glenohumeral Joint: Small joint effusion. Partial-thickness cartilage loss of the glenohumeral joint.  Labrum: Superior labral degeneration. Posterior labral tear with a 2 mm paralabral cyst.  Bones: No fracture or dislocation. No aggressive osseous lesion.  Other: No fluid collection or hematoma.  IMPRESSION: 1. Mild tendinosis of the supraspinatus tendon with a 12 mm full-thickness tear along the anterior aspect and 9 mm of retraction. 2. Moderate tendinosis of the infraspinatus tendon with a interstitial tear at the musculotendinous junction. 3. Mild tendinosis of the subscapularis tendon with a partial-thickness tear. 4. Complete tear of the proximal long head of the biceps tendon with a stump of tissue attached at the bicipito-labral complex.  Electronically Signed   By: Elige Ko M.D.   On: 07/07/2022 07:10    Past Medical/Family/Surgical/Social History: Medications & Allergies reviewed per EMR, new medications updated. 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   PAD (peripheral artery disease) (  HCC) 02/09/2021   Former smoker 08/26/2020   Influenza vaccine needed 03/13/2020   Insomnia 03/13/2020   Primary osteoarthritis of both shoulders 07/24/2019   Morbid obesity (HCC) 07/24/2019   Chronic pain of both shoulders 03/19/2019   Hyperlipidemia 12/16/2017   Other male erectile dysfunction 10/04/2013   Colon cancer  screening 10/04/2013   Diabetes (HCC) 12/22/2012   Essential hypertension 12/22/2012   Essential hypertension, benign 12/22/2012   Type II or unspecified type diabetes mellitus without mention of complication, uncontrolled 10/17/2012   Past Medical History:  Diagnosis Date   Acute DM type 2 causing complication (HCC) 06/12/2022   Chronic pain of both shoulders 03/19/2019   Colon cancer screening 10/04/2013   Coronary artery disease 06/12/2022   Coronary artery disease involving native coronary artery of native heart without angina pectoris 01/21/2022   Diabetes (HCC) 12/22/2012   Diabetes mellitus without complication (HCC)    Dyspnea on exertion 12/09/2021   Essential hypertension 12/22/2012   Essential hypertension, benign 12/22/2012   Former smoker 08/26/2020   Hemoptysis 06/12/2022   Hyperlipidemia 12/16/2017   Influenza vaccine needed 03/13/2020   Insomnia 03/13/2020   Morbid obesity (HCC) 07/24/2019   Neuropathy, arm, 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   Other male erectile dysfunction 10/04/2013   PAD (peripheral artery disease) (HCC) 02/09/2021   Primary osteoarthritis of both shoulders 07/24/2019   Type II or unspecified type diabetes mellitus without mention of complication, uncontrolled 10/17/2012   Unstable angina (HCC)    Family History  Problem Relation Age of Onset   Diabetes Mother    Heart disease Mother    Heart disease Father    Past Surgical History:  Procedure Laterality Date   CORONARY PRESSURE/FFR STUDY N/A 12/17/2021   Procedure: INTRAVASCULAR PRESSURE WIRE/FFR STUDY;  Surgeon: Kathleene Hazel, MD;  Location: [facility];  Service: Cardiovascular;  Laterality: N/A;   CORONARY STENT INTERVENTION N/A 12/17/2021   Procedure: CORONARY STENT INTERVENTION;  Surgeon: Kathleene Hazel, MD;  Location: [facility];  Service: Cardiovascular;  Laterality: N/A;   LEFT HEART CATH AND CORONARY ANGIOGRAPHY N/A 12/17/2021   Procedure: LEFT HEART CATH AND  CORONARY ANGIOGRAPHY;  Surgeon: Kathleene Hazel, MD;  Location: [facility];  Service: Cardiovascular;  Laterality: N/A;   Social History   Occupational History   Not on file  Tobacco Use   Smoking status: Former    Current packs/day: 0.00    Types: Cigarettes    Quit date: 04/2020    Years since quitting: 2.5   Smokeless tobacco: Current  Vaping Use   Vaping status: Some Days  Substance and Sexual Activity   Alcohol use: Never   Drug use: Yes    Types: Marijuana   Sexual activity: Yes

## 2022-10-26 ENCOUNTER — Ambulatory Visit: Payer: Medicare Other | Attending: Internal Medicine | Admitting: Internal Medicine

## 2022-10-26 ENCOUNTER — Encounter: Payer: Self-pay | Admitting: Internal Medicine

## 2022-10-26 ENCOUNTER — Encounter: Payer: Self-pay | Admitting: Pharmacist

## 2022-10-26 ENCOUNTER — Ambulatory Visit: Payer: Medicare Other | Attending: Internal Medicine | Admitting: Pharmacist

## 2022-10-26 ENCOUNTER — Other Ambulatory Visit: Payer: Self-pay | Admitting: Internal Medicine

## 2022-10-26 DIAGNOSIS — I2510 Atherosclerotic heart disease of native coronary artery without angina pectoris: Secondary | ICD-10-CM | POA: Diagnosis not present

## 2022-10-26 DIAGNOSIS — E1151 Type 2 diabetes mellitus with diabetic peripheral angiopathy without gangrene: Secondary | ICD-10-CM | POA: Diagnosis not present

## 2022-10-26 DIAGNOSIS — E1169 Type 2 diabetes mellitus with other specified complication: Secondary | ICD-10-CM | POA: Diagnosis not present

## 2022-10-26 DIAGNOSIS — Z7985 Long-term (current) use of injectable non-insulin antidiabetic drugs: Secondary | ICD-10-CM | POA: Diagnosis not present

## 2022-10-26 DIAGNOSIS — I25110 Atherosclerotic heart disease of native coronary artery with unstable angina pectoris: Secondary | ICD-10-CM | POA: Diagnosis not present

## 2022-10-26 DIAGNOSIS — Z8249 Family history of ischemic heart disease and other diseases of the circulatory system: Secondary | ICD-10-CM | POA: Insufficient documentation

## 2022-10-26 DIAGNOSIS — E1159 Type 2 diabetes mellitus with other circulatory complications: Secondary | ICD-10-CM | POA: Diagnosis not present

## 2022-10-26 DIAGNOSIS — I739 Peripheral vascular disease, unspecified: Secondary | ICD-10-CM | POA: Diagnosis not present

## 2022-10-26 DIAGNOSIS — I152 Hypertension secondary to endocrine disorders: Secondary | ICD-10-CM

## 2022-10-26 DIAGNOSIS — I10 Essential (primary) hypertension: Secondary | ICD-10-CM | POA: Diagnosis not present

## 2022-10-26 DIAGNOSIS — Z7984 Long term (current) use of oral hypoglycemic drugs: Secondary | ICD-10-CM | POA: Insufficient documentation

## 2022-10-26 DIAGNOSIS — E785 Hyperlipidemia, unspecified: Secondary | ICD-10-CM | POA: Insufficient documentation

## 2022-10-26 DIAGNOSIS — Z7982 Long term (current) use of aspirin: Secondary | ICD-10-CM | POA: Diagnosis not present

## 2022-10-26 DIAGNOSIS — Z7189 Other specified counseling: Secondary | ICD-10-CM

## 2022-10-26 DIAGNOSIS — Z833 Family history of diabetes mellitus: Secondary | ICD-10-CM | POA: Diagnosis not present

## 2022-10-26 DIAGNOSIS — Z87891 Personal history of nicotine dependence: Secondary | ICD-10-CM | POA: Insufficient documentation

## 2022-10-26 DIAGNOSIS — Z79899 Other long term (current) drug therapy: Secondary | ICD-10-CM | POA: Insufficient documentation

## 2022-10-26 DIAGNOSIS — E119 Type 2 diabetes mellitus without complications: Secondary | ICD-10-CM | POA: Diagnosis present

## 2022-10-26 DIAGNOSIS — M170 Bilateral primary osteoarthritis of knee: Secondary | ICD-10-CM | POA: Insufficient documentation

## 2022-10-26 LAB — POCT GLYCOSYLATED HEMOGLOBIN (HGB A1C): HbA1c, POC (controlled diabetic range): 9.8 % — AB (ref 0.0–7.0)

## 2022-10-26 LAB — GLUCOSE, POCT (MANUAL RESULT ENTRY): POC Glucose: 302 mg/dl — AB (ref 70–99)

## 2022-10-26 MED ORDER — ROSUVASTATIN CALCIUM 40 MG PO TABS: 40.0000 mg | ORAL_TABLET | Freq: Every day | ORAL | 1 refills | Status: DC

## 2022-10-26 MED ORDER — AMLODIPINE BESYLATE 10 MG PO TABS: 10.0000 mg | ORAL_TABLET | Freq: Every day | ORAL | 1 refills | Status: DC

## 2022-10-26 MED ORDER — GLIMEPIRIDE 4 MG PO TABS: 4.0000 mg | ORAL_TABLET | Freq: Every day | ORAL | 1 refills | Status: DC

## 2022-10-26 MED ORDER — DAPAGLIFLOZIN PROPANEDIOL 10 MG PO TABS: 10.0000 mg | ORAL_TABLET | Freq: Every day | ORAL | 1 refills | Status: DC

## 2022-10-26 MED ORDER — HYDROCHLOROTHIAZIDE 25 MG PO TABS: 25.0000 mg | ORAL_TABLET | Freq: Every day | ORAL | 1 refills | Status: DC

## 2022-10-26 MED ORDER — METFORMIN HCL ER 500 MG PO TB24: 500.0000 mg | ORAL_TABLET | Freq: Every day | ORAL | 1 refills | Status: DC

## 2022-10-26 MED ORDER — LISINOPRIL 30 MG PO TABS: 30.0000 mg | ORAL_TABLET | Freq: Every day | ORAL | 1 refills | Status: DC

## 2022-10-26 MED ORDER — SEMAGLUTIDE(0.25 OR 0.5MG/DOS) 2 MG/3ML ~~LOC~~ SOPN: 0.2500 mg | PEN_INJECTOR | SUBCUTANEOUS | 1 refills | Status: DC

## 2022-10-26 NOTE — Progress Notes (Signed)
Patient was educated on the use of the Ozempic pen. Reviewed necessary supplies and operation of the pen. Also reviewed goal blood glucose levels. Patient was able to demonstrate use. All questions and concerns were addressed.  Butch Penny, PharmD, Patsy [place], CPP Clinical Pharmacist [facility] [phone]

## 2022-10-26 NOTE — Progress Notes (Signed)
Patient ID: Luis Larson, male    DOB: 1956-01-04  MRN: [id number]  CC: Diabetes (DM f/u. Med refills. /No questions / concerns. )   Subjective: Luis Larson is a 67 y.o. male who presents for chronic ds management His concerns today include:  Pt with hx of HTN, DM, HL, former tob dep (quit 04/2020), ED, underdeveloped pectoralis muscle right side, OA knees and shoulders, emphysematous changes on CT  Hemoptysis: Saw pulmonary specialist Dr. Maxwell Marion in April.  He has not had any further hemoptysis.  He thinks patient may have had bronchitis at the time.  Will hold off on bronchoscopy unless he has a recurrent episode.  Plan for repeat CT scan in 1 year.  DM: Results for orders placed or performed in visit on 10/26/22  POCT glucose (manual entry)  Result Value Ref Range   POC Glucose 302 (A) 70 - 99 mg/dl  POCT glycosylated hemoglobin (Hb A1C)  Result Value Ref Range   Hemoglobin A1C     HbA1c POC (<> result, manual entry)     HbA1c, POC (prediabetic range)     HbA1c, POC (controlled diabetic range) 9.8 (A) 0.0 - 7.0 %  Currently on metformin 500 mg once a day, Farxiga and Amaryl 4 mg daily.  Compliant with meds.  Would like to be tried with Ozempic. Checks BS once a wk Doing better with eating habits.  Using whole wheat bread and pasta.  Admits to eating ice cream 2-3x/wk. Walks at park when temp not too hot.  Likes to work in his yard  HTN/CAD/PAD Currently taking: see medication list Med Adherence: [x]  Yes  Compliant with aspirin and Crestor 40 mg daily. -In regards to his blood pressure, he is taking amlodipine 10 mg daily, lisinopril 20 mg daily HCTZ 25 mg daily. Plavix d/c by the cardiologist per pt Medication side effects: [x]  Yes    []  No Adherence with salt restriction: [x]  Yes    []  No Home Monitoring?: [x]  Yes    []  No Monitoring Frequency:  occasionally Home BP results range:  SOB? []  Yes    [x]  No Chest Pain?: []  Yes    [x]  No Leg swelling?: []  Yes    [x]   No Headaches?: []  Yes    [x]  No Dizziness? []  Yes    [x]  No Comments: no claudication symptoms in legs  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   PAD (peripheral artery disease) (HCC) 02/09/2021   Former smoker 08/26/2020   Influenza vaccine needed 03/13/2020   Insomnia 03/13/2020   Primary osteoarthritis of both shoulders 07/24/2019   Morbid obesity (HCC) 07/24/2019   Chronic pain of both shoulders 03/19/2019   Hyperlipidemia 12/16/2017   Other male erectile dysfunction 10/04/2013   Colon cancer screening 10/04/2013   Diabetes (HCC) 12/22/2012   Essential hypertension 12/22/2012   Essential hypertension, benign 12/22/2012   Type II or unspecified type diabetes mellitus without mention of complication, uncontrolled 10/17/2012     Current Outpatient Medications on File Prior to Visit  Medication Sig Dispense Refill   albuterol (  VENTOLIN HFA) 108 (90 Base) MCG/ACT inhaler Inhale 2 puffs into the lungs every 6 (six) hours as needed for wheezing or shortness of breath. 8.5 g 2   aspirin EC 81 MG tablet Take 1 tablet (81 mg total) by mouth daily. 30 tablet    budesonide-formoterol (SYMBICORT) 80-4.5 MCG/ACT inhaler Inhale 2 puffs into the lungs 2 (two) times daily. 10.2 g 3   nitroGLYCERIN (NITROSTAT) 0.4 MG SL tablet Place 0.4 mg under the tongue every 5 (five) minutes as needed for chest pain.     sildenafil (VIAGRA) 100 MG tablet TAKE 1/2 TO 1 TAB BY MOUTH 1 HOUR PRIOR TO INTERCOURSE AS NEEDED 10 tablet 1   traZODone  (DESYREL) 50 MG tablet TAKE 0.5-1 TABLETS (25-50 MG TOTAL) BY MOUTH AT BEDTIME AS NEEDED FOR SLEEP. 30 tablet 2   No current facility-administered medications on file prior to visit.    Allergies  Allergen Reactions   Shellfish Allergy Swelling    Social History   Socioeconomic History   Marital status: Single    Spouse name: Not on file   Number of children: Not on file   Years of education: Not on file   Highest education level: Not on file  Occupational History   Not on file  Tobacco Use   Smoking status: Former    Current packs/day: 0.00    Types: Cigarettes    Quit date: 04/2020    Years since quitting: 2.5   Smokeless tobacco: Current  Vaping Use   Vaping status: Some Days  Substance and Sexual Activity   Alcohol use: Never   Drug use: Yes    Types: Marijuana   Sexual activity: Yes  Other Topics Concern   Not on file  Social History Narrative   ** Merged History Encounter **       Social Determinants of Health   Financial Resource Strain: Low Risk  (06/25/2022)   Overall Financial Resource Strain (CARDIA)    Difficulty of Paying Living Expenses: Not hard at all  Food Insecurity: No Food Insecurity (06/25/2022)   Hunger Vital Sign    Worried About Running Out of Food in the Last Year: Never true    Ran Out of Food in the Last Year: Never true  Transportation Needs: No Transportation Needs (06/25/2022)   PRAPARE - Administrator, Civil Service (Medical): No    Lack of Transportation (Non-Medical): No  Physical Activity: Insufficiently Active (06/25/2022)   Exercise Vital Sign    Days of Exercise per Week: 2 days    Minutes of Exercise per Session: 30 min  Stress: No Stress Concern Present (06/25/2022)   [employer] of Occupational Health - Occupational Stress Questionnaire    Feeling of Stress : Not at all  Social Connections: Moderately Integrated (06/25/2022)   Social Connection and Isolation Panel [NHANES]    Frequency of Communication  with Friends and Family: More than three times a week    Frequency of Social Gatherings with Friends and Family: Once a week    Attends Religious Services: More than 4 times per year    Active Member of [employer] or Organizations: No    Attends Banker Meetings: Never    Marital Status: Living with partner  Intimate Partner Violence: Not At Risk (06/25/2022)   Humiliation, Afraid, Rape, and Kick questionnaire    Fear of Current or Ex-Partner: No    Emotionally Abused: No    Physically Abused: No    Sexually Abused:  No    Family History  Problem Relation Age of Onset   Diabetes Mother    Heart disease Mother    Heart disease Father     Past Surgical History:  Procedure Laterality Date   CORONARY PRESSURE/FFR STUDY N/A 12/17/2021   Procedure: INTRAVASCULAR PRESSURE WIRE/FFR STUDY;  Surgeon: Kathleene Hazel, MD;  Location: [facility];  Service: Cardiovascular;  Laterality: N/A;   CORONARY STENT INTERVENTION N/A 12/17/2021   Procedure: CORONARY STENT INTERVENTION;  Surgeon: Kathleene Hazel, MD;  Location: [facility];  Service: Cardiovascular;  Laterality: N/A;   LEFT HEART CATH AND CORONARY ANGIOGRAPHY N/A 12/17/2021   Procedure: LEFT HEART CATH AND CORONARY ANGIOGRAPHY;  Surgeon: Kathleene Hazel, MD;  Location: [facility];  Service: Cardiovascular;  Laterality: N/A;    ROS: Review of Systems Negative except as stated above  PHYSICAL EXAM: BP 138/77 (BP Location: Left Arm, Patient Position: Sitting, Cuff Size: Large)   Pulse (!) 55   Temp 97.9 F (36.6 C) (Oral)   Ht 5\' 11"  (1.803 m)   Wt (!) 308 lb (139.7 kg)   SpO2 91%   BMI 42.96 kg/m   Wt Readings from Last 3 Encounters:  10/26/22 (!) 308 lb (139.7 kg)  07/30/22 (!) 302 lb (137 kg)  07/01/22 (!) 305 lb 0.6 oz (138.4 kg)    Physical Exam   General appearance - alert, well appearing, and in no distress Mental status - normal mood, behavior, speech, dress, motor  activity, and thought processes Neck - supple, no significant adenopathy Chest - clear to auscultation, no wheezes, rales or rhonchi, symmetric air entry Heart - normal rate, regular rhythm, normal S1, S2, no murmurs, rubs, clicks or gallops Extremities -no lower extremity edema.  Moderate large varicosities in both lower legs. Diabetic Foot Exam - Simple   Simple Foot Form Diabetic Foot exam was performed with the following findings: Yes 10/26/2022 10:42 AM  Visual Inspection No deformities, no ulcerations, no other skin breakdown bilaterally: Yes Sensation Testing Intact to touch and monofilament testing bilaterally: Yes Pulse Check Posterior Tibialis and Dorsalis pulse intact bilaterally: Yes Comments         Latest Ref Rng & Units 06/25/2022   11:30 AM 01/19/2022   10:30 AM 12/10/2021   11:57 AM  CMP  Glucose 70 - 99 mg/dL 166  063  016   BUN 8 - 27 mg/dL 15  16  16    Creatinine 0.76 - 1.27 mg/dL 0.10  9.32  3.55   Sodium 134 - 144 mmol/L 137  137  138   Potassium 3.5 - 5.2 mmol/L 4.2  4.6  4.6   Chloride 96 - 106 mmol/L 100  98  103   CO2 20 - 29 mmol/L 23  25  25    Calcium 8.6 - 10.2 mg/dL 9.2  9.7  9.5   Total Protein 6.0 - 8.5 g/dL  6.8    Total Bilirubin 0.0 - 1.2 mg/dL  0.3    Alkaline Phos 44 - 121 IU/L  87    AST 0 - 40 IU/L  17    ALT 0 - 44 IU/L  19     Lipid Panel     Component Value Date/Time   CHOL 112 01/19/2022 1030   TRIG 91 01/19/2022 1030   HDL 41 01/19/2022 1030   CHOLHDL 2.7 01/19/2022 1030   CHOLHDL 4.1 09/16/2014 1744   VLDL 22 09/16/2014 1744   LDLCALC 53 01/19/2022  1030    CBC    Component Value Date/Time   WBC 7.3 06/25/2022 1130   WBC 7.9 06/06/2013 1156   RBC 5.31 06/25/2022 1130   RBC 5.71 06/06/2013 1156   HGB 14.8 06/25/2022 1130   HCT 44.7 06/25/2022 1130   PLT 227 06/25/2022 1130   MCV 84 06/25/2022 1130   MCH 27.9 06/25/2022 1130   MCH 29.1 06/06/2013 1156   MCHC 33.1 06/25/2022 1130   MCHC 34.6 06/06/2013 1156   RDW  14.2 06/25/2022 1130   LYMPHSABS 2.4 06/06/2013 1156   MONOABS 0.6 06/06/2013 1156   EOSABS 0.2 06/06/2013 1156   BASOSABS 0.0 06/06/2013 1156    ASSESSMENT AND PLAN: 1. Type 2 diabetes mellitus with morbid obesity (HCC) Not at goal. Dietary counseling given.  Encouraged him to cut back on sweet snacks including ice cream. Discussed starting him on Ozempic which would be great for his diabetes and to help with weight loss.  Went over with him how the medication works.  Discussed possible side effects including nausea, vomiting, abdominal pain, pancreatitis, bowel obstruction, severe diarrhea or constipation.  Advised that if he develops any vomiting, abdominal pain or abdominal pain and not able to pass his bowels, he should stop the medicine and be seen immediately.  Clinical pharmacist met with him today to teach administration.  Will start on the lowest dose of 0.25 mg once a week.  Follow-up in 6 weeks to see how he is tolerating the medicine and for further titration.  If he is doing well, we will stop the glimepiride  - POCT glucose (manual entry) - POCT glycosylated hemoglobin (Hb A1C) - dapagliflozin propanediol (FARXIGA) 10 MG TABS tablet; Take 1 tablet (10 mg total) by mouth daily before breakfast.  Dispense: 90 tablet; Refill: 1 - glimepiride (AMARYL) 4 MG tablet; Take 1 tablet (4 mg total) by mouth daily with breakfast.  Dispense: 90 tablet; Refill: 1 - metFORMIN (GLUCOPHAGE-XR) 500 MG 24 hr tablet; Take 1 tablet (500 mg total) by mouth daily with breakfast.  Dispense: 90 tablet; Refill: 1 - Semaglutide,0.25 or 0.5MG /DOS, 2 MG/3ML SOPN; Inject 0.25 mg into the skin once a week.  Dispense: 3 mL; Refill: 1  2. Hypertension associated with type 2 diabetes mellitus (HCC) Not at goal.  Increase lisinopril to 30 mg daily. - amLODipine (NORVASC) 10 MG tablet; Take 1 tablet (10 mg total) by mouth daily.  Dispense: 90 tablet; Refill: 1 - hydrochlorothiazide (HYDRODIURIL) 25 MG tablet; Take  1 tablet (25 mg total) by mouth daily.  Dispense: 90 tablet; Refill: 1 - lisinopril (ZESTRIL) 30 MG tablet; Take 1 tablet (30 mg total) by mouth daily.  Dispense: 90 tablet; Refill: 1  3. Coronary artery disease involving native coronary artery of native heart without angina pectoris Stable.  Continue rosuvastatin and aspirin. - rosuvastatin (CRESTOR) 40 MG tablet; Take 1 tablet (40 mg total) by mouth daily.  Dispense: 90 tablet; Refill: 1  4. PAD (peripheral artery disease) (HCC) Asymptomatic.  Continue aspirin and rosuvastatin.  5. Diabetes mellitus treated with oral medication (HCC) See #1 above.  6. Long-term (current) use of injectable non-insulin antidiabetic drugs See #1 above.    Patient was given the opportunity to ask questions.  Patient verbalized understanding of the plan and was able to repeat key elements of the plan.   This documentation was completed using Paediatric nurse.  Any transcriptional errors are unintentional.  Orders Placed This Encounter  Procedures   POCT glucose (manual entry)  POCT glycosylated hemoglobin (Hb A1C)     Requested Prescriptions   Signed Prescriptions Disp Refills   amLODipine (NORVASC) 10 MG tablet 90 tablet 1    Sig: Take 1 tablet (10 mg total) by mouth daily.   dapagliflozin propanediol (FARXIGA) 10 MG TABS tablet 90 tablet 1    Sig: Take 1 tablet (10 mg total) by mouth daily before breakfast.   glimepiride (AMARYL) 4 MG tablet 90 tablet 1    Sig: Take 1 tablet (4 mg total) by mouth daily with breakfast.   hydrochlorothiazide (HYDRODIURIL) 25 MG tablet 90 tablet 1    Sig: Take 1 tablet (25 mg total) by mouth daily.   lisinopril (ZESTRIL) 30 MG tablet 90 tablet 1    Sig: Take 1 tablet (30 mg total) by mouth daily.   metFORMIN (GLUCOPHAGE-XR) 500 MG 24 hr tablet 90 tablet 1    Sig: Take 1 tablet (500 mg total) by mouth daily with breakfast.   rosuvastatin (CRESTOR) 40 MG tablet 90 tablet 1    Sig: Take 1  tablet (40 mg total) by mouth daily.   Semaglutide,0.25 or 0.5MG /DOS, 2 MG/3ML SOPN 3 mL 1    Sig: Inject 0.25 mg into the skin once a week.    Return in about 7 weeks (around 12/14/2022) for f/u on Ozempic.  Jonah Blue, MD, FACP

## 2022-10-26 NOTE — Patient Instructions (Signed)
Blood pressure is not at goal.  We have increased lisinopril to 30 mg daily.  Please check your blood pressure at least twice a week and record the readings.  Bring readings with you on next visit.  We have started Ozempic 0.25 mg once a week. Semaglutide Injection What is this medication? SEMAGLUTIDE (SEM a GLOO tide) treats type 2 diabetes. It works by increasing insulin levels in your body, which decreases your blood sugar (glucose). It also reduces the amount of sugar released into the blood and slows down your digestion. It can also be used to lower the risk of heart attack and stroke in people with type 2 diabetes. Changes to diet and exercise are often combined with this medication. This medicine may be used for other purposes; ask your health care provider or pharmacist if you have questions. COMMON BRAND NAME(S): OZEMPIC What should I tell my care team before I take this medication? They need to know if you have any of these conditions: Endocrine tumors (MEN 2) or if someone in your family had these tumors Eye disease, vision problems History of pancreatitis Kidney disease Stomach problems Thyroid cancer or if someone in your family had thyroid cancer An unusual or allergic reaction to semaglutide, other medications, foods, dyes, or preservatives Pregnant or trying to get pregnant Breast-feeding How should I use this medication? This medication is for injection under the skin of your upper leg (thigh), stomach area, or upper arm. It is given once every week (every 7 days). You will be taught how to prepare and give this medication. Use exactly as directed. Take your medication at regular intervals. Do not take it more often than directed. If you use this medication with insulin, you should inject this medication and the insulin separately. Do not mix them together. Do not give the injections right next to each other. Change (rotate) injection sites with each injection. It is important  that you put your used needles and syringes in a special sharps container. Do not put them in a trash can. If you do not have a sharps container, call your pharmacist or care team to get one. A special MedGuide will be given to you by the pharmacist with each prescription and refill. Be sure to read this information carefully each time. This medication comes with INSTRUCTIONS FOR USE. Ask your pharmacist for directions on how to use this medication. Read the information carefully. Talk to your pharmacist or care team if you have questions. Talk to your care team about the use of this medication in children. Special care may be needed. Overdosage: If you think you have taken too much of this medicine contact a poison control center or emergency room at once. NOTE: This medicine is only for you. Do not share this medicine with others. What if I miss a dose? If you miss a dose, take it as soon as you can within 5 days after the missed dose. Then take your next dose at your regular weekly time. If it has been longer than 5 days after the missed dose, do not take the missed dose. Take the next dose at your regular time. Do not take double or extra doses. If you have questions about a missed dose, contact your care team for advice. What may interact with this medication? Other medications for diabetes Many medications may cause changes in blood sugar, these include: Alcohol containing beverages Antiviral medications for HIV or AIDS Aspirin and aspirin-like medications Certain medications for blood pressure,  heart disease, irregular heart beat Chromium Diuretics Male hormones, such as estrogens or progestins, birth control pills Fenofibrate Gemfibrozil Isoniazid Lanreotide Male hormones or anabolic steroids MAOIs like Carbex, Eldepryl, Marplan, Nardil, and Parnate Medications for weight loss Medications for allergies, asthma, cold, or cough Medications for depression, anxiety, or psychotic  disturbances Niacin Nicotine NSAIDs, medications for pain and inflammation, like ibuprofen or naproxen Octreotide Pasireotide Pentamidine Phenytoin Probenecid Quinolone antibiotics such as ciprofloxacin, levofloxacin, ofloxacin Some herbal dietary supplements Steroid medications such as prednisone or cortisone Sulfamethoxazole; trimethoprim Thyroid hormones Some medications can hide the warning symptoms of low blood sugar (hypoglycemia). You may need to monitor your blood sugar more closely if you are taking one of these medications. These include: Beta-blockers, often used for high blood pressure or heart problems (examples include atenolol, metoprolol, propranolol) Clonidine Guanethidine Reserpine This list may not describe all possible interactions. Give your health care provider a list of all the medicines, herbs, non-prescription drugs, or dietary supplements you use. Also tell them if you smoke, drink alcohol, or use illegal drugs. Some items may interact with your medicine. What should I watch for while using this medication? Visit your care team for regular checks on your progress. Drink plenty of fluids while taking this medication. Check with your care team if you get an attack of severe diarrhea, nausea, and vomiting. The loss of too much body fluid can make it dangerous for you to take this medication. A test called the HbA1C (A1C) will be monitored. This is a simple blood test. It measures your blood sugar control over the last 2 to 3 months. You will receive this test every 3 to 6 months. Learn how to check your blood sugar. Learn the symptoms of low and high blood sugar and how to manage them. Always carry a quick-source of sugar with you in case you have symptoms of low blood sugar. Examples include hard sugar candy or glucose tablets. Make sure others know that you can choke if you eat or drink when you develop serious symptoms of low blood sugar, such as seizures or  unconsciousness. They must get medical help at once. Tell your care team if you have high blood sugar. You might need to change the dose of your medication. If you are sick or exercising more than usual, you might need to change the dose of your medication. Do not skip meals. Ask your care team if you should avoid alcohol. Many nonprescription cough and cold products contain sugar or alcohol. These can affect blood sugar. Pens should never be shared. Even if the needle is changed, sharing may result in passing of viruses like hepatitis or HIV. Wear a medical ID bracelet or chain, and carry a card that describes your disease and details of your medication and dosage times What side effects may I notice from receiving this medication? Side effects that you should report to your care team as soon as possible: Allergic reactions--skin rash, itching, hives, swelling of the face, lips, tongue, or throat Change in vision Dehydration--increased thirst, dry mouth, feeling faint or lightheaded, headache, dark yellow or brown urine Gallbladder problems--severe stomach pain, nausea, vomiting, fever Heart palpitations--rapid, pounding, or irregular heartbeat Kidney injury--decrease in the amount of urine, swelling of the ankles, hands, or feet Pancreatitis--severe stomach pain that spreads to your back or gets worse after eating or when touched, fever, nausea, vomiting Thoughts of suicide or self-harm, worsening mood, feelings of depression Thyroid cancer--new mass or lump in the neck, pain or trouble  swallowing, trouble breathing, hoarseness Side effects that usually do not require medical attention (report these to your care team if they continue or are bothersome): Diarrhea Loss of appetite Nausea Upset stomach This list may not describe all possible side effects. Call your doctor for medical advice about side effects. You may report side effects to FDA at 1-800-FDA-1088. Where should I keep my  medication? Keep out of the reach of children. Store unopened pens in a refrigerator between 2 and 8 degrees C (36 and 46 degrees F). Do not freeze. Protect from light and heat. After you first use the pen, it can be stored for 56 days at room temperature between 15 and 30 degrees C (59 and 86 degrees F) or in a refrigerator. Throw away your used pen after 56 days or after the expiration date, whichever comes first. Do not store your pen with the needle attached. If the needle is left on, medication may leak from the pen. NOTE: This sheet is a summary. It may not cover all possible information. If you have questions about this medicine, talk to your doctor, pharmacist, or health care provider.  2024 Elsevier/Gold Standard (2022-06-06 00:00:00)

## 2022-12-23 ENCOUNTER — Ambulatory Visit: Payer: Medicare Other | Attending: Internal Medicine | Admitting: Internal Medicine

## 2022-12-23 ENCOUNTER — Encounter: Payer: Self-pay | Admitting: Internal Medicine

## 2022-12-23 DIAGNOSIS — I10 Essential (primary) hypertension: Secondary | ICD-10-CM | POA: Insufficient documentation

## 2022-12-23 DIAGNOSIS — Z79899 Other long term (current) drug therapy: Secondary | ICD-10-CM | POA: Insufficient documentation

## 2022-12-23 DIAGNOSIS — Z23 Encounter for immunization: Secondary | ICD-10-CM

## 2022-12-23 DIAGNOSIS — Z87891 Personal history of nicotine dependence: Secondary | ICD-10-CM | POA: Insufficient documentation

## 2022-12-23 DIAGNOSIS — Z7985 Long-term (current) use of injectable non-insulin antidiabetic drugs: Secondary | ICD-10-CM | POA: Diagnosis not present

## 2022-12-23 DIAGNOSIS — Z7984 Long term (current) use of oral hypoglycemic drugs: Secondary | ICD-10-CM | POA: Insufficient documentation

## 2022-12-23 DIAGNOSIS — Z6841 Body Mass Index (BMI) 40.0 and over, adult: Secondary | ICD-10-CM | POA: Insufficient documentation

## 2022-12-23 DIAGNOSIS — E1169 Type 2 diabetes mellitus with other specified complication: Secondary | ICD-10-CM

## 2022-12-23 DIAGNOSIS — E119 Type 2 diabetes mellitus without complications: Secondary | ICD-10-CM | POA: Insufficient documentation

## 2022-12-23 MED ORDER — SEMAGLUTIDE(0.25 OR 0.5MG/DOS) 2 MG/3ML ~~LOC~~ SOPN: 0.5000 mg | PEN_INJECTOR | SUBCUTANEOUS | 3 refills | Status: DC

## 2022-12-23 NOTE — Patient Instructions (Signed)
Increase Ozempic to 0.5 mg once a week. Please stop the medicine if you develop any vomiting, abdominal pain, severe diarrhea or severe constipation. Please get your flu shot from your [facility].  Consider getting your COVID-vaccine as well.

## 2022-12-23 NOTE — Progress Notes (Signed)
Patient ID: Luis Larson, male    DOB: Mar 06, 1956  MRN: [id number]  CC: Hypertension (HTN f/u./Concern about small prgress on weight loss while using Ozempic /Will need to schedule nurse visit for flu vax - out of high dose vax)   Subjective: Luis Larson is a 67 y.o. male who presents for chronic ds management. His concerns today include:  Pt with hx of HTN, DM, HL, former tob dep (quit 04/2020), ED, underdeveloped pectoralis muscle right side, OA knees and shoulders, emphysematous changes on CT   Patient presents for follow-up management.  On last visit 2 months ago, we started him on Ozempic to help with weight management and diabetes.  He has been tolerating the Ozempic 0.25 mg once a week.  Denies any significant side effects. Occasional nausea. Down 2 lbs since last visit. Initially it decreased his appetite. Checks BS 1-2 times a wk; usually before meals.  Gives range 140-145.     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   PAD (peripheral artery disease) (HCC) 02/09/2021   Former smoker 08/26/2020   Influenza vaccine needed 03/13/2020   Insomnia 03/13/2020   Primary osteoarthritis of both shoulders 07/24/2019   Morbid obesity (HCC) 07/24/2019   Chronic pain of both shoulders 03/19/2019   Hyperlipidemia 12/16/2017   Other male erectile dysfunction 10/04/2013   Colon cancer screening 10/04/2013   Diabetes (HCC) 12/22/2012   Essential hypertension 12/22/2012   Essential hypertension, benign 12/22/2012   Type II or  unspecified type diabetes mellitus without mention of complication, uncontrolled 10/17/2012     Current Outpatient Medications on File Prior to Visit  Medication Sig Dispense Refill   albuterol (VENTOLIN HFA) 108 (90 Base) MCG/ACT inhaler Inhale 2 puffs into the lungs every 6 (six) hours as needed for wheezing or shortness of breath. 8.5 g 2   amLODipine (NORVASC) 10 MG tablet Take 1 tablet (10 mg total) by mouth daily. 90 tablet 1   aspirin EC 81 MG tablet Take 1 tablet (81 mg total) by mouth daily. 30 tablet    budesonide-formoterol (SYMBICORT) 80-4.5 MCG/ACT inhaler Inhale 2 puffs into the lungs 2 (two) times daily. 10.2 g 3   dapagliflozin propanediol (FARXIGA) 10 MG TABS tablet Take 1 tablet (10 mg total) by mouth daily before breakfast. 90 tablet 1   glimepiride (AMARYL) 4 MG tablet Take 1 tablet (4 mg total) by mouth daily with breakfast. 90 tablet 1   hydrochlorothiazide (HYDRODIURIL) 25 MG tablet Take 1 tablet (25 mg total) by mouth daily. 90 tablet 1   lisinopril (ZESTRIL) 30 MG tablet Take 1 tablet (30 mg total) by mouth daily. 90 tablet 1   metFORMIN (GLUCOPHAGE-XR) 500 MG 24 hr tablet Take 1 tablet (500 mg total) by mouth daily with breakfast. 90 tablet 1   nitroGLYCERIN (NITROSTAT) 0.4 MG SL tablet Place 0.4 mg under the tongue every 5 (five) minutes as needed for chest pain.     rosuvastatin (CRESTOR) 40 MG tablet Take 1 tablet (40 mg total) by mouth daily. 90 tablet 1   Semaglutide,0.25 or 0.5MG /DOS,  2 MG/3ML SOPN Inject 0.25 mg into the skin once a week. 3 mL 1   sildenafil (VIAGRA) 100 MG tablet TAKE 1/2 TO 1 TAB BY MOUTH 1 HOUR PRIOR TO INTERCOURSE AS NEEDED 10 tablet 1   traZODone (DESYREL) 50 MG tablet TAKE 0.5-1 TABLETS (25-50 MG TOTAL) BY MOUTH AT BEDTIME AS NEEDED FOR SLEEP. 30 tablet 2   No current facility-administered medications on file prior to visit.    Allergies  Allergen Reactions   Shellfish Allergy Swelling    Social History   Socioeconomic History    Marital status: Single    Spouse name: Not on file   Number of children: Not on file   Years of education: Not on file   Highest education level: Not on file  Occupational History   Not on file  Tobacco Use   Smoking status: Former    Current packs/day: 0.00    Types: Cigarettes    Quit date: 04/2020    Years since quitting: 2.6   Smokeless tobacco: Current  Vaping Use   Vaping status: Some Days  Substance and Sexual Activity   Alcohol use: Never   Drug use: Yes    Types: Marijuana   Sexual activity: Yes  Other Topics Concern   Not on file  Social History Narrative   ** Merged History Encounter **       Social Determinants of Health   Financial Resource Strain: Low Risk  (06/25/2022)   Overall Financial Resource Strain (CARDIA)    Difficulty of Paying Living Expenses: Not hard at all  Food Insecurity: No Food Insecurity (06/25/2022)   Hunger Vital Sign    Worried About Running Out of Food in the Last Year: Never true    Ran Out of Food in the Last Year: Never true  Transportation Needs: No Transportation Needs (06/25/2022)   PRAPARE - Administrator, Civil Service (Medical): No    Lack of Transportation (Non-Medical): No  Physical Activity: Insufficiently Active (06/25/2022)   Exercise Vital Sign    Days of Exercise per Week: 2 days    Minutes of Exercise per Session: 30 min  Stress: No Stress Concern Present (06/25/2022)   [employer] of Occupational Health - Occupational Stress Questionnaire    Feeling of Stress : Not at all  Social Connections: Moderately Integrated (06/25/2022)   Social Connection and Isolation Panel [NHANES]    Frequency of Communication with Friends and Family: More than three times a week    Frequency of Social Gatherings with Friends and Family: Once a week    Attends Religious Services: More than 4 times per year    Active Member of [employer] or Organizations: No    Attends Banker Meetings: Never    Marital Status:  Living with partner  Intimate Partner Violence: Not At Risk (06/25/2022)   Humiliation, Afraid, Rape, and Kick questionnaire    Fear of Current or Ex-Partner: No    Emotionally Abused: No    Physically Abused: No    Sexually Abused: No    Family History  Problem Relation Age of Onset   Diabetes Mother    Heart disease Mother    Heart disease Father     Past Surgical History:  Procedure Laterality Date   CORONARY PRESSURE/FFR STUDY N/A 12/17/2021   Procedure: INTRAVASCULAR PRESSURE WIRE/FFR STUDY;  Surgeon: Kathleene Hazel, MD;  Location: [facility];  Service: Cardiovascular;  Laterality: N/A;   CORONARY STENT INTERVENTION N/A  12/17/2021   Procedure: CORONARY STENT INTERVENTION;  Surgeon: Kathleene Hazel, MD;  Location: [facility];  Service: Cardiovascular;  Laterality: N/A;   LEFT HEART CATH AND CORONARY ANGIOGRAPHY N/A 12/17/2021   Procedure: LEFT HEART CATH AND CORONARY ANGIOGRAPHY;  Surgeon: Kathleene Hazel, MD;  Location: [facility];  Service: Cardiovascular;  Laterality: N/A;    ROS: Review of Systems Negative except as stated above  PHYSICAL EXAM: BP 112/67 (BP Location: Left Arm, Patient Position: Sitting, Cuff Size: Large)   Pulse (!) 53   Ht 5\' 11"  (1.803 m)   Wt (!) 306 lb (138.8 kg)   SpO2 92%   BMI 42.68 kg/m   Wt Readings from Last 3 Encounters:  12/23/22 (!) 306 lb (138.8 kg)  10/26/22 (!) 308 lb (139.7 kg)  07/30/22 (!) 302 lb (137 kg)    Physical Exam General appearance - alert, well appearing, morbidly obese older Caucasian male and in no distress Mental status - normal mood, behavior, speech, dress, motor activity, and thought processes     Latest Ref Rng & Units 06/25/2022   11:30 AM 01/19/2022   10:30 AM 12/10/2021   11:57 AM  CMP  Glucose 70 - 99 mg/dL 409  811  914   BUN 8 - 27 mg/dL 15  16  16    Creatinine 0.76 - 1.27 mg/dL 7.82  9.56  2.13   Sodium 134 - 144 mmol/L 137  137  138   Potassium 3.5 -  5.2 mmol/L 4.2  4.6  4.6   Chloride 96 - 106 mmol/L 100  98  103   CO2 20 - 29 mmol/L 23  25  25    Calcium 8.6 - 10.2 mg/dL 9.2  9.7  9.5   Total Protein 6.0 - 8.5 g/dL  6.8    Total Bilirubin 0.0 - 1.2 mg/dL  0.3    Alkaline Phos 44 - 121 IU/L  87    AST 0 - 40 IU/L  17    ALT 0 - 44 IU/L  19     Lipid Panel     Component Value Date/Time   CHOL 112 01/19/2022 1030   TRIG 91 01/19/2022 1030   HDL 41 01/19/2022 1030   CHOLHDL 2.7 01/19/2022 1030   CHOLHDL 4.1 09/16/2014 1744   VLDL 22 09/16/2014 1744   LDLCALC 53 01/19/2022 1030    CBC    Component Value Date/Time   WBC 7.3 06/25/2022 1130   WBC 7.9 06/06/2013 1156   RBC 5.31 06/25/2022 1130   RBC 5.71 06/06/2013 1156   HGB 14.8 06/25/2022 1130   HCT 44.7 06/25/2022 1130   PLT 227 06/25/2022 1130   MCV 84 06/25/2022 1130   MCH 27.9 06/25/2022 1130   MCH 29.1 06/06/2013 1156   MCHC 33.1 06/25/2022 1130   MCHC 34.6 06/06/2013 1156   RDW 14.2 06/25/2022 1130   LYMPHSABS 2.4 06/06/2013 1156   MONOABS 0.6 06/06/2013 1156   EOSABS 0.2 06/06/2013 1156   BASOSABS 0.0 06/06/2013 1156    ASSESSMENT AND PLAN: 1. Type 2 diabetes mellitus with morbid obesity (HCC) Patient is taking and tolerating Ozempic.  He reports some nausea at times but not a major issue.  He is agreeable to increasing the dose of Ozempic to 0.25 mg once a week.  Went over possible side effects of the medication again.  Advised to stop the medication and be seen if he develops any vomiting, abdominal pain, severe diarrhea or constipation.  Continue Farxiga, metformin and Amaryl.  My goal is to eventually get him off the Amaryl as we continue to increase the Ozempic.  Advised him to continue to monitor blood sugars. - Semaglutide,0.25 or 0.5MG /DOS, 2 MG/3ML SOPN; Inject 0.5 mg into the skin once a week.  Dispense: 3 mL; Refill: 3  2. Long-term (current) use of injectable non-insulin antidiabetic drugs 3. Diabetes mellitus treated with oral medication  (HCC) See #1 above.  4. Need for influenza vaccination We ran out of the flu shot today.  Advised patient to get at his local pharmacy and have them send me documentation.     Patient was given the opportunity to ask questions.  Patient verbalized understanding of the plan and was able to repeat key elements of the plan.   This documentation was completed using Paediatric nurse.  Any transcriptional errors are unintentional.  No orders of the defined types were placed in this encounter.    Requested Prescriptions    No prescriptions requested or ordered in this encounter    No follow-ups on file.  Jonah Blue, MD, FACP

## 2023-01-13 ENCOUNTER — Other Ambulatory Visit: Payer: Self-pay | Admitting: Internal Medicine

## 2023-01-13 NOTE — Telephone Encounter (Signed)
Requested Prescriptions  Refused Prescriptions Disp Refills   clopidogrel (PLAVIX) 75 MG tablet [Pharmacy Med Name: CLOPIDOGREL 75 MG TABLET] 90 tablet 2    Sig: TAKE 1 TABLET BY MOUTH EVERY DAY     Hematology: Antiplatelets - clopidogrel Failed - 01/13/2023 11:37 AM      Failed - HCT in normal range and within 180 days    Hematocrit  Date Value Ref Range Status  06/25/2022 44.7 37.5 - 51.0 % Final         Failed - HGB in normal range and within 180 days    Hemoglobin  Date Value Ref Range Status  06/25/2022 14.8 13.0 - 17.7 g/dL Final         Failed - PLT in normal range and within 180 days    Platelets  Date Value Ref Range Status  06/25/2022 227 150 - 450 x10E3/uL Final         Passed - Cr in normal range and within 360 days    Creatinine  Date Value Ref Range Status  07/24/2019 66.8 20.0 - 300.0 mg/dL Final   Creat  Date Value Ref Range Status  09/16/2014 0.87 0.50 - 1.35 mg/dL Final   Creatinine, Ser  Date Value Ref Range Status  06/25/2022 0.86 0.76 - 1.27 mg/dL Final         Passed - Valid encounter within last 6 months    Recent Outpatient Visits           3 weeks ago Type 2 diabetes mellitus with morbid obesity (HCC)   [place] [facility] Marcine Matar, MD   2 months ago Encounter for medication review and counseling   [facility] [place], Cornelius Moras, RPH-CPP   2 months ago Type 2 diabetes mellitus with morbid obesity [facility])   [place] [facility] Marcine Matar, MD   6 months ago Encounter for [employer] annual wellness exam   [place] [facility] Jonah Blue B, MD   8 months ago Type 2 diabetes mellitus with morbid obesity [facility])   [place] [facility] Marcine Matar, MD       Future Appointments             In 6 days Madelyn Brunner, DO [facility]

## 2023-01-19 ENCOUNTER — Other Ambulatory Visit: Payer: Self-pay

## 2023-01-19 ENCOUNTER — Ambulatory Visit (INDEPENDENT_AMBULATORY_CARE_PROVIDER_SITE_OTHER): Payer: Medicare Other | Admitting: Sports Medicine

## 2023-01-19 ENCOUNTER — Encounter: Payer: Self-pay | Admitting: Sports Medicine

## 2023-01-19 DIAGNOSIS — G8929 Other chronic pain: Secondary | ICD-10-CM | POA: Diagnosis not present

## 2023-01-19 DIAGNOSIS — M25511 Pain in right shoulder: Secondary | ICD-10-CM

## 2023-01-19 DIAGNOSIS — M66321 Spontaneous rupture of flexor tendons, right upper arm: Secondary | ICD-10-CM | POA: Diagnosis not present

## 2023-01-19 DIAGNOSIS — M12811 Other specific arthropathies, not elsewhere classified, right shoulder: Secondary | ICD-10-CM

## 2023-01-19 DIAGNOSIS — M12812 Other specific arthropathies, not elsewhere classified, left shoulder: Secondary | ICD-10-CM

## 2023-01-19 NOTE — Progress Notes (Signed)
Luis Larson - 67 y.o. male MRN [id number]  Date of birth: 1955-06-20  Office Visit Note: Visit Date: 01/19/2023 PCP: Marcine Matar, MD Referred by: Marcine Matar, MD  Subjective: Chief Complaint  Patient presents with   Right Shoulder - Pain   HPI: Luis Larson is a pleasant 67 y.o. male who presents today for anterior right shoulder pain.  He has pain throughout the shoulder, and this has been acute on chronic.  But his pain today is more so over the anterior aspect, pointing to the biceps tendon.  Previous MRI from March of this year shows a complete tear of the proximal long head of the biceps tendon.  About 3 months ago we did perform a subacromial joint injections which did give him good relief in the shoulder, specifically the posterior shoulder.  But again the anterior shoulder is more bothersome.  He does not take any medication for this.  Pertinent ROS were reviewed with the patient and found to be negative unless otherwise specified above in HPI.   Assessment & Plan: Visit Diagnoses:  1. Nontraumatic rupture of right proximal biceps tendon   2. Chronic right shoulder pain   3. Rotator cuff arthropathy of both shoulders    Plan: Discussed with Luis Larson the nature of his right shoulder pain which is multifactorial with multiple rotator cuff tears, but seems to be most aggravating for him he recently is his biceps tendon in which she has a difficult proximal tear.  He does not like to take medication.  Through shared decision-making, we did proceed with ultrasound-guided proximal bicep tendon sheath injection, patient tolerated well.  Use ice, Tylenol for any postinjection pain.  Would like him to continue to perform his shoulder rehab exercises at home as able.  We could consider subacromial joint injection in the future if the remainder of his shoulder pain does not improve.  Did discuss the option of possible rotator cuff repair with my partner, Dr. August Saucer.  He is not ready  for this currently, but knows this is an option.  Follow-up: Return if symptoms worsen or fail to improve.   Meds & Orders: No orders of the defined types were placed in this encounter.   Orders Placed This Encounter  Procedures   US Guided Needle Placement - No Linked Charges     Procedures:  US-Guided Biceps tendon sheath injection, right shoulder  After discussion on risks/benefits/indications, informed verbal consent was obtained. A timeout was then performed. Patient was placed in supine position on the table in exam room. The patient's anterior shoulder was prepped with betadine and alcohol swabs. Utilizing ultrasound guidance, the biceps tendon sheath was identified in a short-axis view. Using a 22G, 1.5" needle under ultrasound guidance, the tendon sheath was injected from a lateral-medial direction with a 1:1:1 lidocaine:bupivicaine:depomedrol with visualization of injectate flow within the tendon sheath via an in-plane technique. Patient tolerated the procedure well without immediate complications.        Clinical History: No specialty comments available.  He reports that he quit smoking about 2 years ago. His smoking use included cigarettes. He uses smokeless tobacco.  Recent Labs    01/21/22 1502 05/13/22 1634 10/26/22 1019  HGBA1C 7.9* 6.6 9.8*    Objective:    Physical Exam  Gen: Well-appearing, in no acute distress; non-toxic CV: Well-perfused. Warm.  Resp: Breathing unlabored on room air; no wheezing. Psych: Fluid speech in conversation; appropriate affect; normal thought process Neuro: Sensation intact throughout. No gross coordination  deficits.   Ortho Exam - Right shoulder: No AC joint TTP, there is positive TTP palpating within the bicipital groove of the anterior shoulder.  Positive speeds test.  No swelling or effusion.  Imaging:  MR SHOULDER RIGHT WO CONTRAST CLINICAL DATA:  Chronic right shoulder pain. Decreased range of motion.  EXAM: MRI OF  THE RIGHT SHOULDER WITHOUT CONTRAST  TECHNIQUE: Multiplanar, multisequence MR imaging of the shoulder was performed. No intravenous contrast was administered.  COMPARISON:  None Available.  FINDINGS: Rotator cuff: Mild tendinosis of the supraspinatus tendon with a 12 mm full-thickness tear along the anterior aspect and 9 mm of retraction. Moderate tendinosis of the infraspinatus tendon with a interstitial tear at the musculotendinous junction. Teres minor tendon is intact. Mild tendinosis of the subscapularis tendon with a partial-thickness tear.  Muscles: No muscle atrophy or edema. No intramuscular fluid collection or hematoma.  Biceps Long Head: Complete tear of the proximal long head of the biceps tendon with a stump of tissue attached at the bicipito-labral complex.  Acromioclavicular Joint: Moderate arthropathy of the acromioclavicular joint. No subacromial/subdeltoid bursal fluid.  Glenohumeral Joint: Small joint effusion. Partial-thickness cartilage loss of the glenohumeral joint.  Labrum: Superior labral degeneration. Posterior labral tear with a 2 mm paralabral cyst.  Bones: No fracture or dislocation. No aggressive osseous lesion.  Other: No fluid collection or hematoma.  IMPRESSION: 1. Mild tendinosis of the supraspinatus tendon with a 12 mm full-thickness tear along the anterior aspect and 9 mm of retraction. 2. Moderate tendinosis of the infraspinatus tendon with a interstitial tear at the musculotendinous junction. 3. Mild tendinosis of the subscapularis tendon with a partial-thickness tear. 4. Complete tear of the proximal long head of the biceps tendon with a stump of tissue attached at the bicipito-labral complex.  Electronically Signed   By: Elige Ko M.D.   On: 07/07/2022 07:10    Past Medical/Family/Surgical/Social History: Medications & Allergies reviewed per EMR, new medications updated. Patient Active Problem List   Diagnosis Date  Noted   Acute DM type 2 causing complication (HCC) 06/12/2022   Hyperlipidemia 06/12/2022   Essential hypertension, benign 06/12/2022   Hemoptysis 06/12/2022   Coronary artery disease 06/12/2022   Coronary artery disease involving native coronary artery of native heart without angina pectoris 01/21/2022   Unstable angina (HCC)    Dyspnea on exertion 12/09/2021   Diabetes mellitus without complication (HCC) 12/08/2021   Neuropathy, arm, 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   PAD (peripheral artery disease) (HCC) 02/09/2021   Former smoker 08/26/2020   Influenza vaccine needed 03/13/2020   Insomnia 03/13/2020   Primary osteoarthritis of both shoulders 07/24/2019   Morbid obesity (HCC) 07/24/2019   Chronic pain of both shoulders 03/19/2019   Hyperlipidemia 12/16/2017   Other male erectile dysfunction 10/04/2013   Colon cancer screening 10/04/2013   Diabetes (HCC) 12/22/2012   Essential hypertension 12/22/2012   Essential hypertension, benign 12/22/2012   Type II or unspecified type diabetes mellitus without mention of complication, uncontrolled 10/17/2012   Past Medical History:  Diagnosis Date   Acute DM type 2 causing complication (HCC) 06/12/2022   Chronic pain of both shoulders 03/19/2019   Colon cancer screening 10/04/2013   Coronary artery disease 06/12/2022   Coronary artery disease involving native coronary artery of native heart without angina pectoris 01/21/2022   Diabetes (HCC) 12/22/2012   Diabetes mellitus without complication (HCC)    Dyspnea on exertion 12/09/2021   Essential hypertension 12/22/2012   Essential hypertension, benign 12/22/2012   Former smoker 08/26/2020  Hemoptysis 06/12/2022   Hyperlipidemia 12/16/2017   Influenza vaccine needed 03/13/2020   Insomnia 03/13/2020   Morbid obesity (HCC) 07/24/2019   Neuropathy, arm, 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   Other male erectile dysfunction 10/04/2013   PAD (peripheral artery disease) (HCC) 02/09/2021   Primary osteoarthritis of both  shoulders 07/24/2019   Type II or unspecified type diabetes mellitus without mention of complication, uncontrolled 10/17/2012   Unstable angina (HCC)    Family History  Problem Relation Age of Onset   Diabetes Mother    Heart disease Mother    Heart disease Father    Past Surgical History:  Procedure Laterality Date   CORONARY PRESSURE/FFR STUDY N/A 12/17/2021   Procedure: INTRAVASCULAR PRESSURE WIRE/FFR STUDY;  Surgeon: Kathleene Hazel, MD;  [facility]: [facility];  Service: Cardiovascular;  Laterality: N/A;   CORONARY STENT INTERVENTION N/A 12/17/2021   Procedure: CORONARY STENT INTERVENTION;  Surgeon: Kathleene Hazel, MD;  [facility]: [facility];  Service: Cardiovascular;  Laterality: N/A;   LEFT HEART CATH AND CORONARY ANGIOGRAPHY N/A 12/17/2021   Procedure: LEFT HEART CATH AND CORONARY ANGIOGRAPHY;  Surgeon: Kathleene Hazel, MD;  Location: [facility];  Service: Cardiovascular;  Laterality: N/A;   Social History   Occupational History   Not on file  Tobacco Use   Smoking status: Former    Current packs/day: 0.00    Types: Cigarettes    Quit date: 04/2020    Years since quitting: 2.7   Smokeless tobacco: Current  Vaping Use   Vaping status: Some Days  Substance and Sexual Activity   Alcohol use: Never   Drug use: Yes    Types: Marijuana   Sexual activity: Yes

## 2023-01-19 NOTE — Progress Notes (Signed)
Patient is inquiring about injections into the shoulder.

## 2023-01-24 ENCOUNTER — Ambulatory Visit: Payer: Medicare Other | Admitting: Sports Medicine

## 2023-01-24 ENCOUNTER — Encounter: Payer: Self-pay | Admitting: Sports Medicine

## 2023-01-24 DIAGNOSIS — M12811 Other specific arthropathies, not elsewhere classified, right shoulder: Secondary | ICD-10-CM

## 2023-01-24 DIAGNOSIS — M66321 Spontaneous rupture of flexor tendons, right upper arm: Secondary | ICD-10-CM

## 2023-01-24 DIAGNOSIS — M25511 Pain in right shoulder: Secondary | ICD-10-CM | POA: Diagnosis not present

## 2023-01-24 DIAGNOSIS — G8929 Other chronic pain: Secondary | ICD-10-CM

## 2023-01-24 DIAGNOSIS — M75101 Unspecified rotator cuff tear or rupture of right shoulder, not specified as traumatic: Secondary | ICD-10-CM

## 2023-01-24 DIAGNOSIS — M12812 Other specific arthropathies, not elsewhere classified, left shoulder: Secondary | ICD-10-CM

## 2023-01-24 MED ORDER — METHYLPREDNISOLONE ACETATE 40 MG/ML IJ SUSP
40.0000 mg | INTRAMUSCULAR | Status: AC | PRN
  Administered 2023-01-24: 40 mg via INTRA_ARTICULAR

## 2023-01-24 MED ORDER — LIDOCAINE HCL 1 % IJ SOLN
2.0000 mL | INTRAMUSCULAR | Status: AC | PRN
  Administered 2023-01-24: 2 mL

## 2023-01-24 MED ORDER — BUPIVACAINE HCL 0.25 % IJ SOLN
2.0000 mL | INTRAMUSCULAR | Status: AC | PRN
  Administered 2023-01-24: 2 mL via INTRA_ARTICULAR

## 2023-01-24 NOTE — Progress Notes (Signed)
Luis Larson - 67 y.o. male MRN [id number]  Date of birth: November 22, 1955  Office Visit Note: Visit Date: 01/24/2023 PCP: Marcine Matar, MD Referred by: Marcine Matar, MD  Subjective: Chief Complaint  Patient presents with   Right Shoulder - Follow-up, Pain   HPI: Luis Larson is a pleasant 67 y.o. male who presents today for chronic right shoulder pain.  Luis Larson has had chronic right shoulder pain for years.  He has MRI confirmed complete tear of the proximal head of the bicep tendon as well as supraspinatus tearing with retraction.  He has bilateral rotator cuff arthropathy, pursuing nonsurgical management at this time.  We did perform ultrasound-guided proximal biceps tendon sheath injection which gave him excellent relief of his anterior shoulder pain although still having some pain over the posterior lateral aspect with reaching.  Not taking any medication consistently.  Pertinent ROS were reviewed with the patient and found to be negative unless otherwise specified above in HPI.   Assessment & Plan: Visit Diagnoses:  1. Chronic right shoulder pain   2. Rotator cuff arthropathy of both shoulders   3. Nontraumatic tear of supraspinatus tendon, right   4. Nontraumatic rupture of right proximal biceps tendon    Plan: Discussed with Deniece Portela the nature of his shoulder pain with multiple rotator cuff tearing. He does have home exercises for the shoulder to maintain stability and strengthening of the rotator cuff, he will continue these.  Through shared decision making, we proceeded with subacromial joint injection, patient tolerated well.  Discussed if this is giving him good benefit and he does not desire surgical management, we can consider infrequent injections at least 3 months apart.  We did discuss that if he wishes to pursue intervention, we will get him with my partner, Dr. August Saucer. May use ice or over-the-counter anti-inflammatories only as needed for postinjection pain.  He will  follow-up with me as needed.  Follow-up: Return in about 3 months (around 04/26/2023), or if symptoms worsen or fail to improve.   Meds & Orders: No orders of the defined types were placed in this encounter.   Orders Placed This Encounter  Procedures   Large Joint Inj: R subacromial bursa     Procedures: Large Joint Inj: R subacromial bursa on 01/24/2023 10:36 AM Indications: pain Details: 22 G 1.5 in needle, posterior approach Medications: 2 mL lidocaine 1 %; 2 mL bupivacaine 0.25 %; 40 mg methylPREDNISolone acetate 40 MG/ML Outcome: tolerated well, no immediate complications  Subacromial Joint Injection, Right Shoulder After discussion on risks/benefits/indications, informed verbal consent was obtained. A timeout was then performed. Patient was seated on table in exam room. The patient's shoulder was prepped with betadine and alcohol swabs and utilizing posterior approach a 22G, 1.5" needle was directed anteriorly and laterally into the patient's subacromial space was injected with 2:2:1 mixture of lidocaine:bupivicaine:depomedrol with appreciation of free-flowing of the injectate into the bursal space. Patient tolerated the procedure well without immediate complications.   Procedure, treatment alternatives, risks and benefits explained, specific risks discussed. Consent was given by the patient. Immediately prior to procedure a time out was called to verify the correct patient, procedure, equipment, support staff and site/side marked as required. Patient was prepped and draped in the usual sterile fashion.          Clinical History: No specialty comments available.  He reports that he quit smoking about 2 years ago. His smoking use included cigarettes. He uses smokeless tobacco.  Recent Labs  05/13/22 1634 10/26/22 1019  HGBA1C 6.6 9.8*    Objective:    Physical Exam  Gen: Well-appearing, in no acute distress; non-toxic CV: Well-perfused. Warm.  Resp: Breathing unlabored  on room air; no wheezing. Psych: Fluid speech in conversation; appropriate affect; normal thought process Neuro: Sensation intact throughout. No gross coordination deficits.   Ortho Exam - Right shoulder: No TTP over the [facility] joint or the anterior aspect of the bicep tendon the glenohumeral joint.  There is some pain with mild weakness with resisted abduction and empty can testing.  Imaging: MR SHOULDER RIGHT WO CONTRAST CLINICAL DATA:  Chronic right shoulder pain. Decreased range of motion.  EXAM: MRI OF THE RIGHT SHOULDER WITHOUT CONTRAST  TECHNIQUE: Multiplanar, multisequence MR imaging of the shoulder was performed. No intravenous contrast was administered.  COMPARISON:  None Available.  FINDINGS: Rotator cuff: Mild tendinosis of the supraspinatus tendon with a 12 mm full-thickness tear along the anterior aspect and 9 mm of retraction. Moderate tendinosis of the infraspinatus tendon with a interstitial tear at the musculotendinous junction. Teres minor tendon is intact. Mild tendinosis of the subscapularis tendon with a partial-thickness tear.  Muscles: No muscle atrophy or edema. No intramuscular fluid collection or hematoma.  Biceps Long Head: Complete tear of the proximal long head of the biceps tendon with a stump of tissue attached at the bicipito-labral complex.  Acromioclavicular Joint: Moderate arthropathy of the acromioclavicular joint. No subacromial/subdeltoid bursal fluid.  Glenohumeral Joint: Small joint effusion. Partial-thickness cartilage loss of the glenohumeral joint.  Labrum: Superior labral degeneration. Posterior labral tear with a 2 mm paralabral cyst.  Bones: No fracture or dislocation. No aggressive osseous lesion.  Other: No fluid collection or hematoma.  IMPRESSION: 1. Mild tendinosis of the supraspinatus tendon with a 12 mm full-thickness tear along the anterior aspect and 9 mm of retraction. 2. Moderate tendinosis of the infraspinatus  tendon with a interstitial tear at the musculotendinous junction. 3. Mild tendinosis of the subscapularis tendon with a partial-thickness tear. 4. Complete tear of the proximal long head of the biceps tendon with a stump of tissue attached at the bicipito-labral complex.  Electronically Signed   By: Elige Ko M.D.   On: 07/07/2022 07:10    Past Medical/Family/Surgical/Social History: Medications & Allergies reviewed per EMR, new medications updated. 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   PAD (peripheral artery disease) (HCC) 02/09/2021   Former smoker 08/26/2020   Influenza vaccine needed 03/13/2020   Insomnia 03/13/2020   Primary osteoarthritis of both shoulders 07/24/2019   Morbid obesity (HCC) 07/24/2019   Chronic pain of both shoulders 03/19/2019   Hyperlipidemia 12/16/2017   Other male erectile dysfunction 10/04/2013   Colon cancer screening 10/04/2013   Diabetes (HCC) 12/22/2012   Essential hypertension 12/22/2012   Essential hypertension, benign 12/22/2012   Type II or unspecified type diabetes mellitus without mention of complication, uncontrolled 10/17/2012   Past Medical History:  Diagnosis Date   Acute DM type 2 causing complication (HCC) 06/12/2022   Chronic pain of both shoulders 03/19/2019   Colon cancer screening  10/04/2013   Coronary artery disease 06/12/2022   Coronary artery disease involving native coronary artery of native heart without angina pectoris 01/21/2022   Diabetes (HCC)  12/22/2012   Diabetes mellitus without complication (HCC)    Dyspnea on exertion 12/09/2021   Essential hypertension 12/22/2012   Essential hypertension, benign 12/22/2012   Former smoker 08/26/2020   Hemoptysis 06/12/2022   Hyperlipidemia 12/16/2017   Influenza vaccine needed 03/13/2020   Insomnia 03/13/2020   Morbid obesity (HCC) 07/24/2019   Neuropathy, arm, 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   Other male erectile dysfunction 10/04/2013   PAD (peripheral artery disease) (HCC) 02/09/2021   Primary osteoarthritis of both shoulders 07/24/2019   Type II or unspecified type diabetes mellitus without mention of complication, uncontrolled 10/17/2012   Unstable angina (HCC)    Family History  Problem Relation Age of Onset   Diabetes Mother    Heart disease Mother    Heart disease Father    Past Surgical History:  Procedure Laterality Date   CORONARY PRESSURE/FFR STUDY N/A 12/17/2021   Procedure: INTRAVASCULAR PRESSURE WIRE/FFR STUDY;  Surgeon: Kathleene Hazel, MD;  Location: [facility];  Service: Cardiovascular;  Laterality: N/A;   CORONARY STENT INTERVENTION N/A 12/17/2021   Procedure: CORONARY STENT INTERVENTION;  Surgeon: Kathleene Hazel, MD;  Location: [facility];  Service: Cardiovascular;  Laterality: N/A;   LEFT HEART CATH AND CORONARY ANGIOGRAPHY N/A 12/17/2021   Procedure: LEFT HEART CATH AND CORONARY ANGIOGRAPHY;  Surgeon: Kathleene Hazel, MD;  Location: [facility];  Service: Cardiovascular;  Laterality: N/A;   Social History   Occupational History   Not on file  Tobacco Use   Smoking status: Former    Current packs/day: 0.00    Types: Cigarettes    Quit date: 04/2020    Years since quitting: 2.7   Smokeless tobacco: Current  Vaping Use   Vaping status: Some Days  Substance and Sexual Activity   Alcohol use: Never   Drug use: Yes    Types: Marijuana   Sexual activity: Yes

## 2023-01-24 NOTE — Progress Notes (Signed)
Patient says that the front of his shoulder is feeling great from the injection. He is still having pain in the back of his shoulder and is still interested in additional injection today.

## 2023-03-08 ENCOUNTER — Other Ambulatory Visit: Payer: Self-pay | Admitting: Internal Medicine

## 2023-03-08 DIAGNOSIS — R0600 Dyspnea, unspecified: Secondary | ICD-10-CM

## 2023-03-14 ENCOUNTER — Other Ambulatory Visit: Payer: Self-pay | Admitting: Internal Medicine

## 2023-03-14 DIAGNOSIS — I152 Hypertension secondary to endocrine disorders: Secondary | ICD-10-CM

## 2023-03-14 DIAGNOSIS — E1169 Type 2 diabetes mellitus with other specified complication: Secondary | ICD-10-CM

## 2023-03-14 DIAGNOSIS — I2510 Atherosclerotic heart disease of native coronary artery without angina pectoris: Secondary | ICD-10-CM

## 2023-04-12 ENCOUNTER — Other Ambulatory Visit: Payer: Self-pay | Admitting: Internal Medicine

## 2023-04-20 ENCOUNTER — Other Ambulatory Visit: Payer: Self-pay | Admitting: Internal Medicine

## 2023-04-20 DIAGNOSIS — I2510 Atherosclerotic heart disease of native coronary artery without angina pectoris: Secondary | ICD-10-CM

## 2023-04-20 DIAGNOSIS — E1169 Type 2 diabetes mellitus with other specified complication: Secondary | ICD-10-CM

## 2023-04-20 MED ORDER — GLIMEPIRIDE 4 MG PO TABS: 4.0000 mg | ORAL_TABLET | Freq: Every day | ORAL | 1 refills | Status: DC

## 2023-04-20 MED ORDER — ROSUVASTATIN CALCIUM 40 MG PO TABS: 40.0000 mg | ORAL_TABLET | Freq: Every day | ORAL | 1 refills | Status: DC

## 2023-04-27 ENCOUNTER — Other Ambulatory Visit: Payer: Self-pay | Admitting: Internal Medicine

## 2023-04-27 DIAGNOSIS — I152 Hypertension secondary to endocrine disorders: Secondary | ICD-10-CM

## 2023-04-27 MED ORDER — METFORMIN HCL ER 500 MG PO TB24: 500.0000 mg | ORAL_TABLET | Freq: Every day | ORAL | 1 refills | Status: DC

## 2023-04-27 MED ORDER — LISINOPRIL 30 MG PO TABS: 30.0000 mg | ORAL_TABLET | Freq: Every day | ORAL | 1 refills | Status: DC

## 2023-05-02 ENCOUNTER — Ambulatory Visit (INDEPENDENT_AMBULATORY_CARE_PROVIDER_SITE_OTHER): Payer: Medicare HMO | Admitting: Sports Medicine

## 2023-05-02 ENCOUNTER — Encounter: Payer: Self-pay | Admitting: Sports Medicine

## 2023-05-02 DIAGNOSIS — M25511 Pain in right shoulder: Secondary | ICD-10-CM

## 2023-05-02 DIAGNOSIS — M12812 Other specific arthropathies, not elsewhere classified, left shoulder: Secondary | ICD-10-CM | POA: Diagnosis not present

## 2023-05-02 DIAGNOSIS — M12811 Other specific arthropathies, not elsewhere classified, right shoulder: Secondary | ICD-10-CM | POA: Diagnosis not present

## 2023-05-02 DIAGNOSIS — G8929 Other chronic pain: Secondary | ICD-10-CM

## 2023-05-02 DIAGNOSIS — M75101 Unspecified rotator cuff tear or rupture of right shoulder, not specified as traumatic: Secondary | ICD-10-CM | POA: Diagnosis not present

## 2023-05-02 MED ORDER — METHYLPREDNISOLONE ACETATE 40 MG/ML IJ SUSP
40.0000 mg | INTRAMUSCULAR | Status: AC | PRN
  Administered 2023-05-02: 40 mg via INTRA_ARTICULAR

## 2023-05-02 MED ORDER — BUPIVACAINE HCL 0.25 % IJ SOLN
2.0000 mL | INTRAMUSCULAR | Status: AC | PRN
  Administered 2023-05-02: 2 mL via INTRA_ARTICULAR

## 2023-05-02 MED ORDER — LIDOCAINE HCL 1 % IJ SOLN
2.0000 mL | INTRAMUSCULAR | Status: AC | PRN
  Administered 2023-05-02: 2 mL

## 2023-05-02 NOTE — Progress Notes (Signed)
Patient says that he got good relief from the injection and his pain began to return about 2.5 to 3 weeks ago. He says that he has not had any new injuries, just return of pain.

## 2023-05-02 NOTE — Progress Notes (Addendum)
Luis Larson - 68 y.o. male MRN [id number]  Date of birth: [birth year]  Office Visit Note: Visit Date: 05/02/2023 PCP: Marcine Matar, MD Referred by: Marcine Matar, MD  Subjective: Chief Complaint  Patient presents with   Right Shoulder - Follow-up   HPI: Luis Larson is a pleasant 68 y.o. male who presents today for follow-up of chronic right shoulder pain.  He has chronic right shoulder pain with known significant rotator cuff arthropathy with supraspinatus retraction as well as complete tear of the proximal bicep tendon.  He has done well with injections in the past, the last for back in early October, he is hopeful for repeat injection today.  He does have home exercises and is very active with his buddy with upper extremity use.  Pertinent ROS were reviewed with the patient and found to be negative unless otherwise specified above in HPI.   Assessment & Plan: Visit Diagnoses:  1. Chronic right shoulder pain   2. Rotator cuff arthropathy of both shoulders   3. Nontraumatic tear of supraspinatus tendon, right    Plan: Impression is acute exacerbation of chronic right shoulder pain with global rotator cuff arthropathy and supraspinatus/bicep tendon tearing.  She has received good relief in the past with injections, through shared decision making to proceed with subacromial joint injection today.  He may use ice/heat or over-the-counter anti-inflammatories only as needed for pain control.  I would like him to remain active after 48 hours of rest with his home exercises for his physical activity of the upper extremity.  In the past, we did perform a biceps tendon sheath injection as well, I will see him back in 1 week to reevaluate the shoulder and hopefully his pain is settled down completely.  If not, we will consider GHJ or bicep tendon injection.  Follow-up: Return in about 1 week (around 05/09/2023) for R-shoulder; consider GHJ or bicep inj if pain not improved.   Meds &  Orders: No orders of the defined types were placed in this encounter.   Orders Placed This Encounter  Procedures   Large Joint Inj: R subacromial bursa     Procedures: Large Joint Inj: R subacromial bursa on 05/02/2023 10:21 AM Indications: pain Details: 22 G 1.5 in needle, posterior approach Medications: 2 mL lidocaine 1 %; 2 mL bupivacaine 0.25 %; 40 mg methylPREDNISolone acetate 40 MG/ML Outcome: tolerated well, no immediate complications  Subacromial Joint Injection, Right Shoulder After discussion on risks/benefits/indications, informed verbal consent was obtained. A timeout was then performed. Patient was seated on table in exam room. The patient's shoulder was prepped with betadine and alcohol swabs and utilizing posterior approach a 22G, 1.5" needle was directed anteriorly and laterally into the patient's subacromial space was injected with 2:2:1 mixture of lidocaine:bupivicaine:depomedrol with appreciation of free-flowing of the injectate into the bursal space. Patient tolerated the procedure well without immediate complications.   Procedure, treatment alternatives, risks and benefits explained, specific risks discussed. Consent was given by the patient. Immediately prior to procedure a time out was called to verify the correct patient, procedure, equipment, support staff and site/side marked as required. Patient was prepped and draped in the usual sterile fashion.          Clinical History: No specialty comments available.  He reports that he quit smoking about 3 years ago. His smoking use included cigarettes. He uses smokeless tobacco.  Recent Labs    05/13/22 1634 10/26/22 1019  HGBA1C 6.6 9.8*    Objective:  Physical Exam  Gen: Well-appearing, in no acute distress; non-toxic CV: Well-perfused. Warm.  Resp: Breathing unlabored on room air; no wheezing. Psych: Fluid speech in conversation; appropriate affect; normal thought process  Ortho Exam - Right shoulder:  No AC joint TTP, no redness swelling or effusion.  There is tenderness over the anterior aspect bicep tendon and the anterior joint recess.  There is some pain and mild weakness with resisted abduction and empty can testing.  Imaging:  Narrative & Impression  CLINICAL DATA:  Chronic right shoulder pain. Decreased range of motion.   EXAM: MRI OF THE RIGHT SHOULDER WITHOUT CONTRAST   TECHNIQUE: Multiplanar, multisequence MR imaging of the shoulder was performed. No intravenous contrast was administered.   COMPARISON:  None Available.   FINDINGS: Rotator cuff: Mild tendinosis of the supraspinatus tendon with a 12 mm full-thickness tear along the anterior aspect and 9 mm of retraction. Moderate tendinosis of the infraspinatus tendon with a interstitial tear at the musculotendinous junction. Teres minor tendon is intact. Mild tendinosis of the subscapularis tendon with a partial-thickness tear.   Muscles: No muscle atrophy or edema. No intramuscular fluid collection or hematoma.   Biceps Long Head: Complete tear of the proximal long head of the biceps tendon with a stump of tissue attached at the bicipito-labral complex.   Acromioclavicular Joint: Moderate arthropathy of the acromioclavicular joint. No subacromial/subdeltoid bursal fluid.   Glenohumeral Joint: Small joint effusion. Partial-thickness cartilage loss of the glenohumeral joint.   Labrum: Superior labral degeneration. Posterior labral tear with a 2 mm paralabral cyst.   Bones: No fracture or dislocation. No aggressive osseous lesion.   Other: No fluid collection or hematoma.   IMPRESSION: 1. Mild tendinosis of the supraspinatus tendon with a 12 mm full-thickness tear along the anterior aspect and 9 mm of retraction. 2. Moderate tendinosis of the infraspinatus tendon with a interstitial tear at the musculotendinous junction. 3. Mild tendinosis of the subscapularis tendon with a partial-thickness tear. 4.  Complete tear of the proximal long head of the biceps tendon with a stump of tissue attached at the bicipito-labral complex.     Electronically Signed   By: Elige Ko M.D.   On: 07/07/2022 07:10    Past Medical/Family/Surgical/Social History: Medications & Allergies reviewed per EMR, new medications updated. 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   PAD (peripheral artery disease) (HCC) 02/09/2021   Former smoker 08/26/2020   Influenza vaccine needed 03/13/2020   Insomnia 03/13/2020   Primary osteoarthritis of both shoulders 07/24/2019   Morbid obesity (HCC) 07/24/2019   Chronic pain of both shoulders 03/19/2019   Hyperlipidemia 12/16/2017   Other male erectile dysfunction 10/04/2013   Colon cancer screening 10/04/2013   Diabetes (HCC) 12/22/2012   Essential hypertension 12/22/2012   Essential hypertension, benign 12/22/2012   Type II or unspecified type diabetes mellitus without mention of complication, uncontrolled 10/17/2012   Past Medical History:  Diagnosis Date   Acute DM type 2 causing complication (HCC) 06/12/2022   Chronic pain of both shoulders 03/19/2019   Colon cancer screening 10/04/2013  Coronary artery disease 06/12/2022   Coronary artery disease involving native coronary artery of native heart without angina pectoris 01/21/2022   Diabetes (HCC) 12/22/2012   Diabetes mellitus without complication (HCC)    Dyspnea on exertion 12/09/2021   Essential hypertension 12/22/2012   Essential hypertension,  benign 12/22/2012   Former smoker 08/26/2020   Hemoptysis 06/12/2022   Hyperlipidemia 12/16/2017   Influenza vaccine needed 03/13/2020   Insomnia 03/13/2020   Morbid obesity (HCC) 07/24/2019   Neuropathy, arm, 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   Other male erectile dysfunction 10/04/2013   PAD (peripheral artery disease) (HCC) 02/09/2021   Primary osteoarthritis of both shoulders 07/24/2019   Type II or unspecified type diabetes mellitus without mention of complication, uncontrolled 10/17/2012   Unstable angina (HCC)    Family History  Problem Relation Age of Onset   Diabetes Mother    Heart disease Mother    Heart disease Father    Past Surgical History:  Procedure Laterality Date   CORONARY PRESSURE/FFR STUDY N/A 12/17/2021   Procedure: INTRAVASCULAR PRESSURE WIRE/FFR STUDY;  Surgeon: Kathleene Hazel, MD;  Location: [facility];  Service: Cardiovascular;  Laterality: N/A;   CORONARY STENT INTERVENTION N/A 12/17/2021   Procedure: CORONARY STENT INTERVENTION;  Surgeon: Kathleene Hazel, MD;  Location: [facility];  Service: Cardiovascular;  Laterality: N/A;   LEFT HEART CATH AND CORONARY ANGIOGRAPHY N/A 12/17/2021   Procedure: LEFT HEART CATH AND CORONARY ANGIOGRAPHY;  Surgeon: Kathleene Hazel, MD;  Location: [facility];  Service: Cardiovascular;  Laterality: N/A;   Social History   Occupational History   Not on file  Tobacco Use   Smoking status: Former    Current packs/day: 0.00    Types: Cigarettes    Quit date: 04/2020    Years since quitting: 3.0   Smokeless tobacco: Current  Vaping Use   Vaping status: Some Days  Substance and Sexual Activity   Alcohol use: Never   Drug use: Yes    Types: Marijuana   Sexual activity: Yes

## 2023-05-11 ENCOUNTER — Ambulatory Visit (INDEPENDENT_AMBULATORY_CARE_PROVIDER_SITE_OTHER): Payer: Medicare HMO | Admitting: Sports Medicine

## 2023-05-11 ENCOUNTER — Other Ambulatory Visit: Payer: Self-pay

## 2023-05-11 ENCOUNTER — Encounter: Payer: Self-pay | Admitting: Sports Medicine

## 2023-05-11 DIAGNOSIS — R29898 Other symptoms and signs involving the musculoskeletal system: Secondary | ICD-10-CM | POA: Diagnosis not present

## 2023-05-11 DIAGNOSIS — M66321 Spontaneous rupture of flexor tendons, right upper arm: Secondary | ICD-10-CM | POA: Diagnosis not present

## 2023-05-11 DIAGNOSIS — G5621 Lesion of ulnar nerve, right upper limb: Secondary | ICD-10-CM | POA: Diagnosis not present

## 2023-05-11 DIAGNOSIS — M25511 Pain in right shoulder: Secondary | ICD-10-CM

## 2023-05-11 DIAGNOSIS — G8929 Other chronic pain: Secondary | ICD-10-CM

## 2023-05-11 NOTE — Progress Notes (Signed)
Patient says that he got good relief from the last injection. He says that he still feels it pinching with certain movements. He is still interested in another injection today.

## 2023-05-11 NOTE — Progress Notes (Addendum)
Luis Larson - 68 y.o. male MRN [id number]  Date of birth: 12-Nov-1955  Office Visit Note: Visit Date: 05/11/2023 PCP: Marcine Matar, MD Referred by: Marcine Matar, MD  Subjective: Chief Complaint  Patient presents with   Right Shoulder - Follow-up   HPI: Luis Larson is a pleasant 68 y.o. male who presents today for follow-up of chronic right shoulder pain.  Right shoulder -right shoulder is feeling better after the subacromial joint injection.  He still having some pain over the anterior aspect, pointing to the biceps tendon region.  He did slightly exacerbate this with doing a abduction exercises at the gym.  Right hand/finger weakness -he has weakness as well as a chronic numbness of the fourth and fifth digit.  He states about 2 years ago he had an episode where he was reaching the arm backward and he felt a sharp stabbing pain over the top and posterior aspect of the shoulder that shot all the way down the arm.  Since then he has had chronic numbness in the fourth and fifth digit and has weakness with those 2 fingers.  Pertinent ROS were reviewed with the patient and found to be negative unless otherwise specified above in HPI.   Assessment & Plan: Visit Diagnoses:  1. Chronic right shoulder pain   2. Neuritis of right ulnar nerve   3. Right hand weakness   4. Nontraumatic rupture of right proximal biceps tendon    Plan: Impression is acute on chronic right shoulder pain with rotator cuff arthropathy and a proximal biceps tendon rupture with a stump still left at the proximal bicipito-labral complex.  He is symptomatic right at this location, we did perform an ultrasound-guided biceps tendon sheath and anterior joint injection, patient tolerated well.  Advised on 48 hours of modified rest/activity.  I am okay with him getting back into the gym this weekend, he would like to do his shoulder strengthening exercises.  May use over-the-counter anti-inflammatories for pain as  needed.  At the end of our visit, he did note chronic numbness and weakness of the fourth and fifth digit.  He does have weakness with finger adduction/abduction.  I would like to start with EMG/nerve conduction study to see how his nerve is firing and evaluate for any degrees of impingement with a partner Dr. Alvester Morin.  Could consider future workup with the neck including starting with x-rays and possible MRI but we will start with the NCS first.  He will follow-up with me in about 2-3 weeks following this to review and discuss next steps.  Follow-up: Return for f/u 2-3 weeks after EMG/NCS (cervical XR?).   Meds & Orders: No orders of the defined types were placed in this encounter.   Orders Placed This Encounter  Procedures   US Guided Needle Placement - No Linked Charges   Ambulatory referral to Physical Medicine Rehab     Procedures: US-Guided Biceps tendon sheath injection, right shoulder  After discussion on risks/benefits/indications, informed verbal consent was obtained. A timeout was then performed. Patient was placed in supine position on the table in exam room. The patient's anterior shoulder was prepped with betadine and alcohol swabs. Utilizing ultrasound guidance, the biceps tendon sheath was identified in a short-axis view. Using a 22G, 1.5" needle under ultrasound guidance, the tendon sheath was injected from a lateral-medial direction with a 1:1:1 lidocaine:bupivicaine:betamethasone with visualization of injectate flow within the tendon sheath via an in-plane technique. Patient tolerated the procedure well without immediate complications.  Clinical History: No specialty comments available.  He reports that he quit smoking about 3 years ago. His smoking use included cigarettes. He uses smokeless tobacco.  Recent Labs    05/13/22 1634 10/26/22 1019  HGBA1C 6.6 9.8*    Objective:    Physical Exam  Gen: Well-appearing, in no acute distress; non-toxic CV:  Well-perfused. Warm.  Resp: Breathing unlabored on room air; no wheezing. Psych: Fluid speech in conversation; appropriate affect; normal thought process  Ortho Exam - Right shoulder: + TTP over the bicipital groove of the right shoulder.  There is no redness swelling or effusion.  Improved resisted abduction although still mild weakness and pain.  Right hand: There is diminished sensation with sharp and dull testing on the palmar aspect of the fourth and fifth digit.  Negative Tinel's at Guyon's canal, equivocal at the cubital tunnel.  There is weakness with finger adduction and abduction.  Imaging: No results found.  Past Medical/Family/Surgical/Social History: Medications & Allergies reviewed per EMR, new medications updated. 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   PAD (peripheral artery disease) (HCC) 02/09/2021   Former smoker 08/26/2020   Influenza vaccine needed 03/13/2020   Insomnia 03/13/2020   Primary osteoarthritis of both shoulders 07/24/2019   Morbid obesity (HCC) 07/24/2019   Chronic pain of both shoulders 03/19/2019   Hyperlipidemia 12/16/2017   Other male erectile dysfunction 10/04/2013   Colon cancer screening 10/04/2013   Diabetes (HCC) 12/22/2012   Essential hypertension 12/22/2012   Essential hypertension, benign 12/22/2012   Type II or unspecified type diabetes mellitus without mention of complication, uncontrolled 10/17/2012   Past Medical History:  Diagnosis Date   Acute DM type 2 causing  complication (HCC) 06/12/2022   Chronic pain of both shoulders 03/19/2019   Colon cancer screening 10/04/2013   Coronary artery disease 06/12/2022   Coronary artery disease involving native coronary artery of native heart without angina pectoris 01/21/2022   Diabetes (HCC) 12/22/2012   Diabetes mellitus without complication (HCC)    Dyspnea on exertion 12/09/2021   Essential hypertension 12/22/2012   Essential hypertension, benign 12/22/2012   Former smoker 08/26/2020   Hemoptysis 06/12/2022   Hyperlipidemia 12/16/2017   Influenza vaccine needed 03/13/2020   Insomnia 03/13/2020   Morbid obesity (HCC) 07/24/2019   Neuropathy, arm, 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   Other male erectile dysfunction 10/04/2013   PAD (peripheral artery disease) (HCC) 02/09/2021   Primary osteoarthritis of both shoulders 07/24/2019   Type II or unspecified type diabetes mellitus without mention of complication, uncontrolled 10/17/2012   Unstable angina (HCC)    Family History  Problem Relation Age of Onset   Diabetes Mother    Heart disease Mother    Heart disease Father    Past Surgical History:  Procedure Laterality Date   CORONARY PRESSURE/FFR STUDY N/A 12/17/2021   Procedure: INTRAVASCULAR PRESSURE WIRE/FFR STUDY;  Surgeon: Kathleene Hazel, MD;  Location: [facility];  Service: Cardiovascular;  Laterality: N/A;   CORONARY STENT INTERVENTION N/A 12/17/2021   Procedure: CORONARY STENT INTERVENTION;  Surgeon: Kathleene Hazel, MD;  Location: [facility];  Service: Cardiovascular;  Laterality: N/A;   LEFT HEART CATH AND CORONARY ANGIOGRAPHY N/A 12/17/2021   Procedure: LEFT HEART CATH AND CORONARY ANGIOGRAPHY;  Surgeon: Kathleene Hazel, MD;  Location: [facility];  Service: Cardiovascular;  Laterality: N/A;   Social History   Occupational History   Not on file  Tobacco Use   Smoking status: Former    Current packs/day: 0.00    Types: Cigarettes    Quit date: 04/2020     Years since quitting: 3.0   Smokeless tobacco: Current  Vaping Use   Vaping status: Some Days  Substance and Sexual Activity   Alcohol use: Never   Drug use: Yes    Types: Marijuana   Sexual activity: Yes   I spent 33 minutes in the care of the patient today including face-to-face time, preparation to see the patient, as well as review of previous imaging, discussion on diagnostic workup of finger numbness and hand weakness, guidance on exercise regimen/home therapy for the above diagnoses.   Madelyn Brunner, DO Primary Care Sports Medicine Physician  [facility]  This note was dictated using Dragon naturally speaking software and may contain errors in syntax, spelling, or content which have not been identified prior to signing this note.

## 2023-05-13 ENCOUNTER — Encounter: Payer: Self-pay | Admitting: Physical Medicine and Rehabilitation

## 2023-05-13 ENCOUNTER — Ambulatory Visit (INDEPENDENT_AMBULATORY_CARE_PROVIDER_SITE_OTHER): Payer: Medicare HMO | Admitting: Physical Medicine and Rehabilitation

## 2023-05-13 DIAGNOSIS — R29898 Other symptoms and signs involving the musculoskeletal system: Secondary | ICD-10-CM

## 2023-05-13 DIAGNOSIS — R200 Anesthesia of skin: Secondary | ICD-10-CM

## 2023-05-13 DIAGNOSIS — M25531 Pain in right wrist: Secondary | ICD-10-CM

## 2023-05-13 DIAGNOSIS — R202 Paresthesia of skin: Secondary | ICD-10-CM | POA: Diagnosis not present

## 2023-05-13 DIAGNOSIS — M25521 Pain in right elbow: Secondary | ICD-10-CM | POA: Diagnosis not present

## 2023-05-13 NOTE — Progress Notes (Signed)
Luis Larson - 69 y.o. male MRN [id number]  Date of birth: 1956-01-08  Office Visit Note: Visit Date: 05/13/2023 PCP: Marcine Matar, MD Referred by: Madelyn Brunner, DO  Subjective: Chief Complaint  Patient presents with   Right Hand - Pain, Numbness   HPI: Luis Larson is a 68 y.o. male who comes in today at the request of Dr. Madelyn Brunner for evaluation and management of chronic, worsening and severe pain, numbness and tingling in the Right upper extremities.  Patient is Right hand dominant.  He reports in the records show that he went to the emergency department in October 2022 after reaching over in his truck with his right arm holding a bag of groceries and immediately feeling sharp shooting pain and paresthesia and tingling in the shoulder blade shoulder and down the arm along the ulnar side it to the middle 2 digits in the last digit on the right hand.  At that time it was felt like he either had some type of neuropathy or possible radiculopathy.  He was given some medication and time and this seemed to get better over time.  He did not have any official workup with electrodiagnostic study etc.  Now he is complaining of chronic numbness in the fourth and fifth digit as well as weakness in the right hand particularly with gripping objects and weakness with those 2 fingers.  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  He has worked in [employer] for a long time.  His case is complicated by history of significant diabetes with hemoglobin A1c of 9.81-year ago.  No formal history of polyneuropathy.  He does not take insulin.  He has not had any cervical MRI or advanced imaging.   I spent more than 30 minutes speaking face-to-face with the patient with 50% of the time in counseling and discussing coordination of care.      Review of Systems  Musculoskeletal:  Positive  for joint pain and neck pain.  Neurological:  Positive for tingling and weakness.   Otherwise per HPI.  Assessment & Plan: Visit Diagnoses:    ICD-10-CM   1. Numbness and tingling in right hand  R20.0 NCV with EMG (electromyography)   R20.2     2. Right hand weakness  R29.898 NCV with EMG (electromyography)    3. Pain in right elbow  M25.521 NCV with EMG (electromyography)    4. Pain in right wrist  M25.531        Plan: Impression: Clinically his exam is most consistent with a severe ulnar nerve neuropathy less likely at the elbow but the mechanism of injury 2 years ago does not quite fit with that.  Electrodiagnostic study performed today.  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  Cannot rule out a lower cord brachial plexus stretch injury.    There is no significant electrodiagnostic evidence of any other focal nerve entrapment or generalized peripheral neuropathy.   Recommendations: 1.  Follow-up with referring physician. 2.  Continue current management of symptoms. 3.  Suggest cervical MRI and clinical correlation is paramount.  Meds & Orders: No orders of the defined types were placed in this encounter.   Orders Placed This Encounter  Procedures   NCV with EMG (  electromyography)    Follow-up: Return for Madelyn Brunner, DO.   Procedures: No procedures performed  EMG & NCV Findings: Evaluation of the right median motor nerve showed reduced amplitude (4.9 mV).  The right median (across palm) sensory nerve showed prolonged distal peak latency (Wrist, 3.9 ms).  All remaining nerves (as indicated in the following tables) were within normal limits.    Needle evaluation of the right abductor pollicis brevis muscle showed diminished recruitment.  The right first dorsal interosseous muscle showed increased insertional activity, slightly  increased spontaneous activity, slightly increased polyphasic potentials, and diminished recruitment.  The right Ext Digitorum muscle showed increased insertional activity, slightly increased spontaneous activity, and slightly increased polyphasic potentials.  All remaining muscles (as indicated in the following table) showed no evidence of electrical instability.    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  Cannot rule out a lower cord brachial plexus stretch injury.    There is no significant electrodiagnostic evidence of any other focal nerve entrapment or generalized peripheral neuropathy.   Recommendations: 1.  Follow-up with referring physician. 2.  Continue current management of symptoms. 3.  Suggest cervical MRI and clinical correlation is paramount.  ___________________________ Elease Hashimoto Board Certified, American Board of Physical Medicine and Rehabilitation    Nerve Conduction Studies Anti Sensory Summary Table   Stim Site NR Peak (ms) Norm Peak (ms) P-T Amp (V) Norm P-T Amp Site1 Site2 Delta-P (ms) Dist (cm) Vel (m/s) Norm Vel (m/s)  Right Median Acr Palm Anti Sensory (2nd Digit)  32.3C  Wrist    *3.9 <3.6 14.0 >10 Wrist Palm 2.0 0.0    Palm    1.9 <2.0 5.1         Right Radial Anti Sensory (Base 1st Digit)  31.8C  Wrist    2.1 <3.1 9.9  Wrist Base 1st Digit 2.1 0.0    Right Ulnar Anti Sensory (5th Digit)  32.5C  Wrist    3.3 <3.7 21.9 >15.0 Wrist 5th Digit 3.3 14.0 42 >38   Motor Summary Table   Stim Site NR Onset (ms) Norm Onset (ms) O-P Amp (mV) Norm O-P Amp Site1 Site2 Delta-0 (ms) Dist (cm) Vel (m/s) Norm Vel (m/s)  Right Median Motor (Abd Poll Brev)  31.9C  Wrist    3.7 <4.2 *4.9 >5 Elbow Wrist 4.7 24.0 51 >50  Elbow    8.4  2.4         Right Ulnar Motor (Abd Dig Min)  32.1C  Wrist    3.1 <4.2  6.8 >3 B Elbow Wrist 4.2 23.0 55 >53  B Elbow    7.3  6.3  A Elbow B Elbow 1.3 10.0 77 >53  A Elbow    8.6  5.9          EMG   Side Muscle Nerve Root Ins Act Fibs Psw Amp Dur Poly Recrt Int Dennie Bible Comment  Right Abd Poll Brev Median C8-T1 Nml Nml Nml Nml Nml 0 *Reduced Nml   Right 1stDorInt Ulnar C8-T1 *Incr *1+ *1+ Nml Nml *1+ *Reduced Nml   Right Deltoid Axillary C5-6 Nml Nml Nml Nml Nml 0 Nml Nml   Right Ext Digitorum  Radial (Post Int) C7-8 *Incr *1+ *1+ Nml Nml *1+ Nml Nml   Right Triceps Radial C6-7-8 Nml Nml Nml Nml Nml 0 Nml Nml     Nerve Conduction Studies  Anti Sensory Left/Right Comparison   Stim Site L Lat (ms) R Lat (ms) L-R Lat (ms) L Amp (V) R Amp (V) L-R Amp (%) Site1 Site2 L Vel (m/s) R Vel (m/s) L-R Vel (m/s)  Median Acr Palm Anti Sensory (2nd Digit)  32.3C  Wrist  *3.9   14.0  Wrist Palm     Palm  1.9   5.1        Radial Anti Sensory (Base 1st Digit)  31.8C  Wrist  2.1   9.9  Wrist Base 1st Digit     Ulnar Anti Sensory (5th Digit)  32.5C  Wrist  3.3   21.9  Wrist 5th Digit  42    Motor Left/Right Comparison   Stim Site L Lat (ms) R Lat (ms) L-R Lat (ms) L Amp (mV) R Amp (mV) L-R Amp (%) Site1 Site2 L Vel (m/s) R Vel (m/s) L-R Vel (m/s)  Median Motor (Abd Poll Brev)  31.9C  Wrist  3.7   *4.9  Elbow Wrist  51   Elbow  8.4   2.4        Ulnar Motor (Abd Dig Min)  32.1C  Wrist  3.1   6.8  B Elbow Wrist  55   B Elbow  7.3   6.3  A Elbow B Elbow  77   A Elbow  8.6   5.9           Waveforms:            Clinical History: No specialty comments available.   He reports that he quit smoking about 3 years ago. His smoking use included cigarettes. He uses smokeless tobacco.  Recent Labs    10/26/22 1019  HGBA1C 9.8*    Objective:  VS:  HT:    WT:   BMI:     BP:   HR: bpm  TEMP: ( )  RESP:  Physical Exam Vitals and nursing note reviewed.  Constitutional:      General: He is not in acute distress.    Appearance: Normal appearance. He is  well-developed. He is obese.  HENT:     Head: Normocephalic and atraumatic.  Eyes:     Conjunctiva/sclera: Conjunctivae normal.     Pupils: Pupils are equal, round, and reactive to light.  Cardiovascular:     Rate and Rhythm: Normal rate.     Pulses: Normal pulses.     Heart sounds: Normal heart sounds.  Pulmonary:     Effort: Pulmonary effort is normal. No respiratory distress.  Musculoskeletal:        General: Tenderness present.     Cervical back: Normal range of motion and neck supple. No rigidity.     Right lower leg: No edema.     Left lower leg: No edema.     Comments: Inspection reveals atrophy of the right first dorsal interosseous as well as hypothenar musculature but no atrophy of the bilateral APB or left FDI or hand intrinsics. There is no swelling, color changes, allodynia or dystrophic changes.  There is 3+ out of 5 strength with finger abduction on the right with good finger extension and flexion.  There is impaired sensation to light touch in the right ulnar nerve distribution.  Equivocal Wartenberg's sign on the right. There is a negative Tinel's test at the bilateral wrist and elbow. There is a negative Phalen's test bilaterally. There is a negative Hoffmann's test bilaterally.  Skin:    General: Skin is warm and dry.  Findings: No erythema or rash.  Neurological:     General: No focal deficit present.     Mental Status: He is alert and oriented to person, place, and time.     Cranial Nerves: No cranial nerve deficit.     Sensory: Sensory deficit present.     Motor: Weakness present. No abnormal muscle tone.     Coordination: Coordination normal.     Gait: Gait normal.  Psychiatric:        Mood and Affect: Mood normal.        Behavior: Behavior normal.        Thought Content: Thought content normal.     Ortho Exam  Imaging: No results found.  Past Medical/Family/Surgical/Social History: Medications & Allergies reviewed per EMR, new medications  updated. 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   PAD (peripheral artery disease) (HCC) 02/09/2021   Former smoker 08/26/2020   Influenza vaccine needed 03/13/2020   Insomnia 03/13/2020   Primary osteoarthritis of both shoulders 07/24/2019   Morbid obesity (HCC) 07/24/2019   Chronic pain of both shoulders 03/19/2019   Hyperlipidemia 12/16/2017   Other male erectile dysfunction 10/04/2013   Colon cancer screening 10/04/2013   Diabetes (HCC) 12/22/2012   Essential hypertension 12/22/2012   Essential hypertension, benign 12/22/2012   Type II or unspecified type diabetes mellitus without mention of complication, uncontrolled 10/17/2012   Past Medical History:  Diagnosis Date   Acute DM type 2 causing complication (HCC) 06/12/2022   Chronic pain of both shoulders 03/19/2019   Colon cancer screening 10/04/2013   Coronary artery disease 06/12/2022   Coronary artery disease involving native coronary artery of native heart without angina pectoris 01/21/2022   Diabetes (HCC) 12/22/2012   Diabetes mellitus without complication (HCC)    Dyspnea on exertion 12/09/2021   Essential hypertension 12/22/2012   Essential hypertension, benign 12/22/2012   Former smoker 08/26/2020   Hemoptysis 06/12/2022   Hyperlipidemia 12/16/2017   Influenza vaccine needed 03/13/2020   Insomnia 03/13/2020   Morbid obesity (HCC) 07/24/2019   Neuropathy, arm, 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   Other male erectile dysfunction 10/04/2013   PAD (peripheral artery  disease) (HCC) 02/09/2021   Primary osteoarthritis of both shoulders 07/24/2019   Type II or unspecified type diabetes mellitus without mention of complication, uncontrolled 10/17/2012   Unstable angina (HCC)    Family History  Problem Relation Age of Onset   Diabetes Mother    Heart disease Mother    Heart disease Father    Past Surgical History:  Procedure Laterality Date   CORONARY PRESSURE/FFR STUDY N/A 12/17/2021   Procedure: INTRAVASCULAR PRESSURE WIRE/FFR STUDY;  Surgeon: Kathleene Hazel, MD;  Location: [facility];  Service: Cardiovascular;  Laterality: N/A;   CORONARY STENT INTERVENTION N/A 12/17/2021   Procedure: CORONARY STENT INTERVENTION;  Surgeon: Kathleene Hazel, MD;  Location: [facility];  Service: Cardiovascular;  Laterality: N/A;   LEFT HEART CATH AND CORONARY ANGIOGRAPHY N/A 12/17/2021   Procedure: LEFT HEART CATH AND CORONARY ANGIOGRAPHY;  Surgeon: Kathleene Hazel, MD;  Location:  [facility];  Service: Cardiovascular;  Laterality: N/A;   Social History   Occupational History   Not on file  Tobacco Use   Smoking status: Former    Current packs/day: 0.00    Types: Cigarettes    Quit date: 04/2020    Years since quitting: 3.0   Smokeless tobacco: Current  Vaping Use   Vaping status: Some Days  Substance and Sexual Activity   Alcohol use: Never   Drug use: Yes    Types: Marijuana   Sexual activity: Yes

## 2023-05-13 NOTE — Procedures (Signed)
EMG & NCV Findings: Evaluation of the right median motor nerve showed reduced amplitude (4.9 mV).  The right median (across palm) sensory nerve showed prolonged distal peak latency (Wrist, 3.9 ms).  All remaining nerves (as indicated in the following tables) were within normal limits.    Needle evaluation of the right abductor pollicis brevis muscle showed diminished recruitment.  The right first dorsal interosseous muscle showed increased insertional activity, slightly increased spontaneous activity, slightly increased polyphasic potentials, and diminished recruitment.  The right Ext Digitorum muscle showed increased insertional activity, slightly increased spontaneous activity, and slightly increased polyphasic potentials.  All remaining muscles (as indicated in the following table) showed no evidence of electrical instability.    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  Cannot rule out a lower cord brachial plexus stretch injury.    There is no significant electrodiagnostic evidence of any other focal nerve entrapment or generalized peripheral neuropathy.   Recommendations: 1.  Follow-up with referring physician. 2.  Continue current management of symptoms. 3.  Suggest cervical MRI and clinical correlation is paramount.  ___________________________ Luis Larson Board Certified, American Board of Physical Medicine and Rehabilitation    Nerve Conduction Studies Anti Sensory Summary Table   Stim Site NR Peak (ms) Norm Peak (ms) P-T Amp (V) Norm P-T Amp Site1 Site2 Delta-P (ms) Dist (cm) Vel (m/s) Norm Vel (m/s)  Right Median Acr Palm Anti Sensory (2nd Digit)  32.3C  Wrist    *3.9 <3.6 14.0 >10 Wrist Palm 2.0 0.0    Palm    1.9 <2.0 5.1         Right Radial Anti Sensory (Base 1st Digit)  31.8C  Wrist    2.1 <3.1 9.9  Wrist  Base 1st Digit 2.1 0.0    Right Ulnar Anti Sensory (5th Digit)  32.5C  Wrist    3.3 <3.7 21.9 >15.0 Wrist 5th Digit 3.3 14.0 42 >38   Motor Summary Table   Stim Site NR Onset (ms) Norm Onset (ms) O-P Amp (mV) Norm O-P Amp Site1 Site2 Delta-0 (ms) Dist (cm) Vel (m/s) Norm Vel (m/s)  Right Median Motor (Abd Poll Brev)  31.9C  Wrist    3.7 <4.2 *4.9 >5 Elbow Wrist 4.7 24.0 51 >50  Elbow    8.4  2.4         Right Ulnar Motor (Abd Dig Min)  32.1C  Wrist    3.1 <4.2 6.8 >3 B Elbow Wrist 4.2 23.0 55 >53  B Elbow    7.3  6.3  A Elbow B Elbow 1.3 10.0 77 >53  A Elbow    8.6  5.9          EMG   Side Muscle Nerve Root Ins Act Fibs Psw Amp Dur Poly Recrt Int Dennie Bible Comment  Right Abd Poll Brev Median C8-T1 Nml Nml Nml Nml Nml 0 *Reduced Nml   Right 1stDorInt Ulnar C8-T1 *Incr *1+ *1+ Nml Nml *1+ *Reduced Nml   Right Deltoid Axillary C5-6 Nml Nml Nml Nml Nml 0 Nml Nml   Right Ext Digitorum  Radial (Post Int) C7-8 *Incr *1+ *1+ Nml Nml *1+ Nml Nml   Right Triceps Radial C6-7-8 Nml Nml Nml Nml Nml 0 Nml Nml     Nerve Conduction Studies Anti Sensory Left/Right Comparison   Stim Site L Lat (ms) R Lat (ms) L-R Lat (ms) L  Amp (V) R Amp (V) L-R Amp (%) Site1 Site2 L Vel (m/s) R Vel (m/s) L-R Vel (m/s)  Median Acr Palm Anti Sensory (2nd Digit)  32.3C  Wrist  *3.9   14.0  Wrist Palm     Palm  1.9   5.1        Radial Anti Sensory (Base 1st Digit)  31.8C  Wrist  2.1   9.9  Wrist Base 1st Digit     Ulnar Anti Sensory (5th Digit)  32.5C  Wrist  3.3   21.9  Wrist 5th Digit  42    Motor Left/Right Comparison   Stim Site L Lat (ms) R Lat (ms) L-R Lat (ms) L Amp (mV) R Amp (mV) L-R Amp (%) Site1 Site2 L Vel (m/s) R Vel (m/s) L-R Vel (m/s)  Median Motor (Abd Poll Brev)  31.9C  Wrist  3.7   *4.9  Elbow Wrist  51   Elbow  8.4   2.4        Ulnar Motor (Abd Dig Min)  32.1C  Wrist  3.1   6.8  B Elbow Wrist  55   B Elbow  7.3   6.3  A Elbow B Elbow  77   A Elbow  8.6   5.9           Waveforms:

## 2023-05-13 NOTE — Progress Notes (Signed)
Functional Pain Scale - descriptive words and definitions  Distressing (6)    Pain is present/unable to complete most ADLs limited by pain/sleep is difficult and active distraction is only marginal. Moderate range order  Average Pain 6  RUE NCS, pain and numbness in right hand to elbow. Loss of strength. He has difficultly grasping and unable to hold for long periods. Right handed

## 2023-05-16 ENCOUNTER — Encounter: Payer: Self-pay | Admitting: Internal Medicine

## 2023-05-16 ENCOUNTER — Ambulatory Visit: Payer: Medicare HMO | Attending: Internal Medicine | Admitting: Internal Medicine

## 2023-05-16 DIAGNOSIS — J438 Other emphysema: Secondary | ICD-10-CM | POA: Diagnosis not present

## 2023-05-16 DIAGNOSIS — Z23 Encounter for immunization: Secondary | ICD-10-CM

## 2023-05-16 DIAGNOSIS — E1159 Type 2 diabetes mellitus with other circulatory complications: Secondary | ICD-10-CM

## 2023-05-16 DIAGNOSIS — I2510 Atherosclerotic heart disease of native coronary artery without angina pectoris: Secondary | ICD-10-CM | POA: Diagnosis not present

## 2023-05-16 DIAGNOSIS — E66813 Obesity, class 3: Secondary | ICD-10-CM | POA: Diagnosis not present

## 2023-05-16 DIAGNOSIS — E119 Type 2 diabetes mellitus without complications: Secondary | ICD-10-CM

## 2023-05-16 DIAGNOSIS — Z6841 Body Mass Index (BMI) 40.0 and over, adult: Secondary | ICD-10-CM

## 2023-05-16 DIAGNOSIS — Z7985 Long-term (current) use of injectable non-insulin antidiabetic drugs: Secondary | ICD-10-CM

## 2023-05-16 DIAGNOSIS — I152 Hypertension secondary to endocrine disorders: Secondary | ICD-10-CM

## 2023-05-16 DIAGNOSIS — E1169 Type 2 diabetes mellitus with other specified complication: Secondary | ICD-10-CM

## 2023-05-16 DIAGNOSIS — Z7984 Long term (current) use of oral hypoglycemic drugs: Secondary | ICD-10-CM

## 2023-05-16 LAB — POCT GLYCOSYLATED HEMOGLOBIN (HGB A1C): HbA1c, POC (controlled diabetic range): 6.2 % (ref 0.0–7.0)

## 2023-05-16 LAB — GLUCOSE, POCT (MANUAL RESULT ENTRY): POC Glucose: 113 mg/dL — AB (ref 70–99)

## 2023-05-16 MED ORDER — FREESTYLE LIBRE 3 PLUS SENSOR MISC: 6 refills | Status: DC

## 2023-05-16 MED ORDER — SEMAGLUTIDE (1 MG/DOSE) 4 MG/3ML ~~LOC~~ SOPN: 1.0000 mg | PEN_INJECTOR | SUBCUTANEOUS | 3 refills | Status: DC

## 2023-05-16 NOTE — Progress Notes (Signed)
Patient ID: Luis Larson, male    DOB: Aug 09, 1955  MRN: [id number]  CC: Diabetes (DM f/u. /Pt recently had an insurance change & [employer] is now requesting a PA for Ozempic /Instructed to bring meds to next appt/Yes to flu vax.)   Subjective: Luis Larson is a 68 y.o. male who presents for chronic ds management. His concerns today include:  Pt with hx of HTN, DM, HL, former tob dep (quit 04/2020), ED, underdeveloped pectoralis muscle right side, OA knees and shoulders, emphysematous changes on CT   DM/Obesity: Results for orders placed or performed in visit on 05/16/23  POCT glucose (manual entry)   Collection Time: 05/16/23  2:13 PM  Result Value Ref Range   POC Glucose 113 (A) 70 - 99 mg/dl  POCT glycosylated hemoglobin (Hb A1C)   Collection Time: 05/16/23  2:23 PM  Result Value Ref Range   Hemoglobin A1C     HbA1c POC (<> result, manual entry)     HbA1c, POC (prediabetic range)     HbA1c, POC (controlled diabetic range) 6.2 0.0 - 7.0 %  Patient is supposed to be on Ozempic 0.5 mg once a week, Farxiga 10 mg daily, Amaryl 4 mg daily and metformin XR 500 mg 1 tablet daily.  No longer checks BS; wants CGM -He has changed insurance.  Insurance now requires prior approval for Ozempic. Doing well on Ozempic without S.E; decreases appetite for the 1st several days after he takes his weekly dose. No longer eats pasta -down 9 lbs since last visit -goes to gym 3x/wk; walks on [place] and swims.  HTN/CAD/PAD:  On Crestor 40 mg, Lisinopril 30 mg, hydrochlorothiazide 25 mg, ASA. No CP/SOB/LE edema  COPD:  Seen on CT chest. uses Albuterol occasionally. No chronic cough  HM:  yes to flu.  Had [facility] exam 9 mths ago [facility]   Patient Active Problem List   Diagnosis Date Noted   Acute DM type 2 causing complication (HCC) 06/12/2022   Hyperlipidemia 06/12/2022   Essential hypertension, benign 06/12/2022   Hemoptysis 06/12/2022   Coronary artery disease 06/12/2022   Coronary  artery disease involving native coronary artery of native heart without angina pectoris 01/21/2022   Unstable angina (HCC)    Dyspnea on exertion 12/09/2021   Diabetes mellitus without complication (HCC) 12/08/2021   Neuropathy, arm, 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   PAD (peripheral artery disease) (HCC) 02/09/2021   Former smoker 08/26/2020   Influenza vaccine needed 03/13/2020   Insomnia 03/13/2020   Primary osteoarthritis of both shoulders 07/24/2019   Morbid obesity (HCC) 07/24/2019   Chronic pain of both shoulders 03/19/2019   Hyperlipidemia 12/16/2017   Other male erectile dysfunction 10/04/2013   Colon cancer screening 10/04/2013   Diabetes (HCC) 12/22/2012   Essential hypertension 12/22/2012   Essential hypertension, benign 12/22/2012   Type II or unspecified type diabetes mellitus without mention of complication, uncontrolled 10/17/2012     Current Outpatient Medications on File Prior to Visit  Medication Sig Dispense Refill   albuterol (VENTOLIN HFA) 108 (90 Base) MCG/ACT inhaler TAKE 2 PUFFS BY MOUTH EVERY 6 HOURS AS NEEDED FOR WHEEZE OR SHORTNESS OF BREATH 8.5 each 2   amLODipine (NORVASC) 10 MG tablet TAKE 1 TABLET BY MOUTH EVERY DAY 90 tablet 0   aspirin EC 81 MG tablet Take 1 tablet (81 mg total) by mouth daily. 30 tablet    budesonide-formoterol (SYMBICORT) 80-4.5 MCG/ACT inhaler Inhale 2 puffs into the lungs 2 (two) times daily. 10.2  g 3   dapagliflozin propanediol (FARXIGA) 10 MG TABS tablet Take 1 tablet (10 mg total) by mouth daily before breakfast. 90 tablet 1   glimepiride (AMARYL) 4 MG tablet Take 1 tablet (4 mg total) by mouth daily with breakfast. 90 tablet 1   hydrochlorothiazide (HYDRODIURIL) 25 MG tablet TAKE 1 TABLET (25 MG TOTAL) BY MOUTH DAILY. 90 tablet 0   lisinopril (ZESTRIL) 30 MG tablet Take 1 tablet (30 mg total) by mouth daily. 90 tablet 1   metFORMIN (GLUCOPHAGE-XR) 500 MG 24 hr tablet Take 1 tablet (500 mg total) by mouth daily with breakfast. 90 tablet 1    nitroGLYCERIN (NITROSTAT) 0.4 MG SL tablet Place 0.4 mg under the tongue every 5 (five) minutes as needed for chest pain.     rosuvastatin (CRESTOR) 40 MG tablet Take 1 tablet (40 mg total) by mouth daily. 90 tablet 1   sildenafil (VIAGRA) 100 MG tablet TAKE 1/2 TO 1 TAB BY MOUTH 1 HOUR PRIOR TO INTERCOURSE AS NEEDED 10 tablet 1   traZODone (DESYREL) 50 MG tablet TAKE 0.5-1 TABLETS (25-50 MG TOTAL) BY MOUTH AT BEDTIME AS NEEDED FOR SLEEP. 30 tablet 2   No current facility-administered medications on file prior to visit.    Allergies  Allergen Reactions   Shellfish Allergy Swelling    Social History   Socioeconomic History   Marital status: Single    Spouse name: Not on file   Number of children: Not on file   Years of education: Not on file   Highest education level: Not on file  Occupational History   Not on file  Tobacco Use   Smoking status: Former    Current packs/day: 0.00    Types: Cigarettes    Quit date: 04/2020    Years since quitting: 3.0   Smokeless tobacco: Current  Vaping Use   Vaping status: Some Days  Substance and Sexual Activity   Alcohol use: Never   Drug use: Yes    Types: Marijuana   Sexual activity: Yes  Other Topics Concern   Not on file  Social History Narrative   ** Merged History Encounter **       Social Drivers of Health   Financial Resource Strain: Low Risk  (06/25/2022)   Overall Financial Resource Strain (CARDIA)    Difficulty of Paying Living Expenses: Not hard at all  Food Insecurity: No Food Insecurity (06/25/2022)   Hunger Vital Sign    Worried About Running Out of Food in the Last Year: Never true    Ran Out of Food in the Last Year: Never true  Transportation Needs: No Transportation Needs (06/25/2022)   PRAPARE - Administrator, Civil Service (Medical): No    Lack of Transportation (Non-Medical): No  Physical Activity: Insufficiently Active (06/25/2022)   Exercise Vital Sign    Days of Exercise per Week: 2 days     Minutes of Exercise per Session: 30 min  Stress: No Stress Concern Present (06/25/2022)   [employer] of Occupational Health - Occupational Stress Questionnaire    Feeling of Stress : Not at all  Social Connections: Moderately Integrated (06/25/2022)   Social Connection and Isolation Panel [NHANES]    Frequency of Communication with Friends and Family: More than three times a week    Frequency of Social Gatherings with Friends and Family: Once a week    Attends Religious Services: More than 4 times per year    Active Member of Clubs or Organizations: No  Attends Banker Meetings: Never    Marital Status: Living with partner  Intimate Partner Violence: Not At Risk (06/25/2022)   Humiliation, Afraid, Rape, and Kick questionnaire    Fear of Current or Ex-Partner: No    Emotionally Abused: No    Physically Abused: No    Sexually Abused: No    Family History  Problem Relation Age of Onset   Diabetes Mother    Heart disease Mother    Heart disease Father     Past Surgical History:  Procedure Laterality Date   CORONARY PRESSURE/FFR STUDY N/A 12/17/2021   Procedure: INTRAVASCULAR PRESSURE WIRE/FFR STUDY;  Surgeon: Kathleene Hazel, MD;  Location: [facility];  Service: Cardiovascular;  Laterality: N/A;   CORONARY STENT INTERVENTION N/A 12/17/2021   Procedure: CORONARY STENT INTERVENTION;  Surgeon: Kathleene Hazel, MD;  Location: [facility];  Service: Cardiovascular;  Laterality: N/A;   LEFT HEART CATH AND CORONARY ANGIOGRAPHY N/A 12/17/2021   Procedure: LEFT HEART CATH AND CORONARY ANGIOGRAPHY;  Surgeon: Kathleene Hazel, MD;  Location: [facility];  Service: Cardiovascular;  Laterality: N/A;    ROS: Review of Systems Negative except as stated above  PHYSICAL EXAM: BP (!) 145/70 (BP Location: Left Arm, Patient Position: Sitting, Cuff Size: Large)   Pulse 77   Temp 97.9 F (36.6 C) (Oral)   Ht 5\' 11"  (1.803 m)   Wt 297 lb  (134.7 kg)   SpO2 92%   BMI 41.42 kg/m   Wt Readings from Last 3 Encounters:  05/16/23 297 lb (134.7 kg)  12/23/22 (!) 306 lb (138.8 kg)  10/26/22 (!) 308 lb (139.7 kg)    Physical Exam   General appearance - alert, well appearing, older caucasian male and in no distress Mental status - normal mood, behavior, speech, dress, motor activity, and thought processes Neck - supple, no significant adenopathy Chest - clear to auscultation, no wheezes, rales or rhonchi, symmetric air entry Heart - normal rate, regular rhythm, normal S1, S2, no murmurs, rubs, clicks or gallops Extremities - peripheral pulses normal, no pedal edema, no clubbing or cyanosis     Latest Ref Rng & Units 06/25/2022   11:30 AM 01/19/2022   10:30 AM 12/10/2021   11:57 AM  CMP  Glucose 70 - 99 mg/dL 161  096  045   BUN 8 - 27 mg/dL 15  16  16    Creatinine 0.76 - 1.27 mg/dL 4.09  8.11  9.14   Sodium 134 - 144 mmol/L 137  137  138   Potassium 3.5 - 5.2 mmol/L 4.2  4.6  4.6   Chloride 96 - 106 mmol/L 100  98  103   CO2 20 - 29 mmol/L 23  25  25    Calcium 8.6 - 10.2 mg/dL 9.2  9.7  9.5   Total Protein 6.0 - 8.5 g/dL  6.8    Total Bilirubin 0.0 - 1.2 mg/dL  0.3    Alkaline Phos 44 - 121 IU/L  87    AST 0 - 40 IU/L  17    ALT 0 - 44 IU/L  19     Lipid Panel     Component Value Date/Time   CHOL 112 01/19/2022 1030   TRIG 91 01/19/2022 1030   HDL 41 01/19/2022 1030   CHOLHDL 2.7 01/19/2022 1030   CHOLHDL 4.1 09/16/2014 1744   VLDL 22 09/16/2014 1744   LDLCALC 53 01/19/2022 1030    CBC  Component Value Date/Time   WBC 7.3 06/25/2022 1130   WBC 7.9 06/06/2013 1156   RBC 5.31 06/25/2022 1130   RBC 5.71 06/06/2013 1156   HGB 14.8 06/25/2022 1130   HCT 44.7 06/25/2022 1130   PLT 227 06/25/2022 1130   MCV 84 06/25/2022 1130   MCH 27.9 06/25/2022 1130   MCH 29.1 06/06/2013 1156   MCHC 33.1 06/25/2022 1130   MCHC 34.6 06/06/2013 1156   RDW 14.2 06/25/2022 1130   LYMPHSABS 2.4 06/06/2013 1156    MONOABS 0.6 06/06/2013 1156   EOSABS 0.2 06/06/2013 1156   BASOSABS 0.0 06/06/2013 1156    ASSESSMENT AND PLAN: 1. Type 2 diabetes mellitus with morbid obesity (HCC) (Primary) At goal.  Commended him on changing his eating habits and trying to exercise regularly.  We agreed to increase Ozempic to 1 mg once a week.  Advised to watch out for any significant side effects with vomiting, abdominal pain, severe diarrhea or constipation.  Message sent to our pharmacy tech to get prior approval.  Once he gets the 1 mg dose of Ozempic, advised to stop the Amaryl.  Continue metformin and Farxiga.  On next visit, if A1c is still at goal, we will discontinue metformin - POCT glycosylated hemoglobin (Hb A1C) - POCT glucose (manual entry) - Continuous Glucose Sensor (FREESTYLE LIBRE 3 PLUS SENSOR) MISC; Change sensor every 15 days.  Dispense: 2 each; Refill: 6 - Semaglutide, 1 MG/DOSE, 4 MG/3ML SOPN; Inject 1 mg as directed once a week.  Dispense: 3 mL; Refill: 3 - Microalbumin / creatinine urine ratio - CBC - Comprehensive metabolic panel - Lipid panel  2. Diabetes mellitus treated with oral medication (HCC) 3. Long-term (current) use of injectable non-insulin antidiabetic drugs See #1 above.  4. Hypertension associated with type 2 diabetes mellitus (HCC) Take lisinopril 30 mg and HCTZ 25 mg as soon as he returns home.  Continue current medications.  5. Coronary artery disease involving native coronary artery of native heart without angina pectoris Stable.  Continue aspirin and Cozaar.  6. Paraseptal emphysema (HCC) Stable with minimal symptoms.  Continue albuterol as needed  7. Encounter for immunization - Flu vaccine trivalent PF, 6mos and older(Flulaval,Afluria,Fluarix,Fluzone)  Patient was given the opportunity to ask questions.  Patient verbalized understanding of the plan and was able to repeat key elements of the plan.   This documentation was completed using Public librarian.  Any transcriptional errors are unintentional.  Orders Placed This Encounter  Procedures   Flu vaccine trivalent PF, 6mos and older(Flulaval,Afluria,Fluarix,Fluzone)   Microalbumin / creatinine urine ratio   CBC   Comprehensive metabolic panel   Lipid panel   POCT glycosylated hemoglobin (Hb A1C)   POCT glucose (manual entry)     Requested Prescriptions   Signed Prescriptions Disp Refills   Continuous Glucose Sensor (FREESTYLE LIBRE 3 PLUS SENSOR) MISC 2 each 6    Sig: Change sensor every 15 days.   Semaglutide, 1 MG/DOSE, 4 MG/3ML SOPN 3 mL 3    Sig: Inject 1 mg as directed once a week.    Return in about 4 months (around 09/13/2023) for Sign release to get eye exam [facility].  MWV with CMA after 06/25/2023.  Jonah Blue, MD, FACP

## 2023-05-16 NOTE — Patient Instructions (Addendum)
Increase Ozempic to 1 mg daily.  Once you start the 1 mg stop glimepiride.

## 2023-05-17 ENCOUNTER — Other Ambulatory Visit: Payer: Self-pay

## 2023-05-17 LAB — COMPREHENSIVE METABOLIC PANEL
ALT: 21 [IU]/L (ref 0–44)
AST: 18 [IU]/L (ref 0–40)
Albumin: 4.6 g/dL (ref 3.9–4.9)
Alkaline Phosphatase: 81 [IU]/L (ref 44–121)
BUN/Creatinine Ratio: 18 (ref 10–24)
BUN: 18 mg/dL (ref 8–27)
Bilirubin Total: 0.2 mg/dL (ref 0.0–1.2)
CO2: 22 mmol/L (ref 20–29)
Calcium: 9.7 mg/dL (ref 8.6–10.2)
Chloride: 101 mmol/L (ref 96–106)
Creatinine, Ser: 1.02 mg/dL (ref 0.76–1.27)
Globulin, Total: 2.2 g/dL (ref 1.5–4.5)
Glucose: 97 mg/dL (ref 70–99)
Potassium: 4.6 mmol/L (ref 3.5–5.2)
Sodium: 139 mmol/L (ref 134–144)
Total Protein: 6.8 g/dL (ref 6.0–8.5)
eGFR: 82 mL/min/{1.73_m2} (ref >59)

## 2023-05-17 LAB — CBC
Hematocrit: 47.7 % (ref 37.5–51.0)
Hemoglobin: 15.4 g/dL (ref 13.0–17.7)
MCH: 28.4 pg (ref 26.6–33.0)
MCHC: 32.3 g/dL (ref 31.5–35.7)
MCV: 88 fL (ref 79–97)
Platelets: 276 10*3/uL (ref 150–450)
RBC: 5.43 x10E6/uL (ref 4.14–5.80)
RDW: 14.8 % (ref 11.6–15.4)
WBC: 13.1 10*3/uL — ABNORMAL HIGH (ref 3.4–10.8)

## 2023-05-17 LAB — LIPID PANEL
Chol/HDL Ratio: 2.8 {ratio} (ref 0.0–5.0)
Cholesterol, Total: 122 mg/dL (ref 100–199)
HDL: 43 mg/dL (ref >39)
LDL Chol Calc (NIH): 52 mg/dL (ref 0–99)
Triglycerides: 162 mg/dL — ABNORMAL HIGH (ref 0–149)
VLDL Cholesterol Cal: 27 mg/dL (ref 5–40)

## 2023-05-17 LAB — MICROALBUMIN / CREATININE URINE RATIO
Creatinine, Urine: 106.1 mg/dL
Microalb/Creat Ratio: 10 mg/g{creat} (ref 0–29)
Microalbumin, Urine: 10.1 ug/mL

## 2023-05-18 ENCOUNTER — Encounter: Payer: Self-pay | Admitting: Internal Medicine

## 2023-05-18 ENCOUNTER — Telehealth: Payer: Self-pay

## 2023-05-18 NOTE — Telephone Encounter (Signed)
 Pharmacy Patient Advocate Encounter  Received notification from [employer] that Prior Authorization for ozempic  has been APPROVED from 05/17/2023 to 04/11/2024

## 2023-05-22 ENCOUNTER — Other Ambulatory Visit: Payer: Self-pay | Admitting: Internal Medicine

## 2023-05-22 DIAGNOSIS — E1169 Type 2 diabetes mellitus with other specified complication: Secondary | ICD-10-CM

## 2023-05-23 ENCOUNTER — Other Ambulatory Visit: Payer: Self-pay

## 2023-05-23 ENCOUNTER — Telehealth: Payer: Self-pay

## 2023-05-23 NOTE — Telephone Encounter (Signed)
 Pharmacy Patient Advocate Encounter  Received notification from [employer] that Prior Authorization for {FREESTYLE LIBRE 3 PLUS SENSOR has been APPROVED from 04/13/2023 to 04/11/2024   PA #/Case ID/Reference #: [id number]

## 2023-05-23 NOTE — Telephone Encounter (Signed)
 Discontinued 05/16/23 due to change in dose.  Requested Prescriptions  Pending Prescriptions Disp Refills   OZEMPIC , 0.25 OR 0.5 MG/DOSE, 2 MG/3ML SOPN [Pharmacy Med Name: OZEMPIC  0.25-0.5 MG/DOSE PEN]      Sig: INJECT 0.5 MG INTO THE SKIN ONCE A WEEK. PLEASE SCHEDULE PCP APPT WITH DR. Lincoln Renshaw FOR MORE REFILLS     Endocrinology:  Diabetes - GLP-1 Receptor Agonists - semaglutide  Passed - 05/23/2023  3:07 PM      Passed - HBA1C in normal range and within 180 days    HbA1c, POC (controlled diabetic range)  Date Value Ref Range Status  05/16/2023 6.2 0.0 - 7.0 % Final         Passed - Cr in normal range and within 360 days    Creatinine  Date Value Ref Range Status  07/24/2019 66.8 20.0 - 300.0 mg/dL Final   Creat  Date Value Ref Range Status  09/16/2014 0.87 0.50 - 1.35 mg/dL Final   Creatinine, Ser  Date Value Ref Range Status  05/16/2023 1.02 0.76 - 1.27 mg/dL Final         Passed - Valid encounter within last 6 months    Recent Outpatient Visits           1 week ago Type 2 diabetes mellitus with morbid obesity (HCC)   [facility] Dept Of [place]. [facility] Concetta Dee B, MD   5 months ago Type 2 diabetes mellitus with morbid obesity [facility])   [facility] - A Dept Of [place]. [facility] Lawrance Presume, MD   6 months ago Encounter for medication review and counseling   [place] [facility] - A Dept Of [place]. [facility] Freada Jacobs, [place] L, RPH-CPP   6 months ago Type 2 diabetes mellitus with morbid obesity [facility])   [place] [facility] - A Dept Of [place]. [facility] Lawrance Presume, MD   11 months ago Encounter for [employer] annual wellness exam   [place] [facility] [place] - A Dept Of . [facility] Lawrance Presume, MD       Future Appointments             In 2 days Revankar, Micael Adas, MD [facility] at [facility]   In 3 months Lawrance Presume, MD [facility] [place] - A Dept Of [place]. [facility]

## 2023-05-25 ENCOUNTER — Ambulatory Visit: Payer: Medicare HMO | Attending: Cardiology | Admitting: Cardiology

## 2023-05-25 ENCOUNTER — Encounter: Payer: Self-pay | Admitting: Cardiology

## 2023-05-25 VITALS — BP 130/70 | HR 52 | Ht 71.0 in | Wt 297.1 lb

## 2023-05-25 DIAGNOSIS — E119 Type 2 diabetes mellitus without complications: Secondary | ICD-10-CM | POA: Diagnosis not present

## 2023-05-25 DIAGNOSIS — I10 Essential (primary) hypertension: Secondary | ICD-10-CM | POA: Diagnosis not present

## 2023-05-25 DIAGNOSIS — E782 Mixed hyperlipidemia: Secondary | ICD-10-CM

## 2023-05-25 DIAGNOSIS — I2510 Atherosclerotic heart disease of native coronary artery without angina pectoris: Secondary | ICD-10-CM

## 2023-05-25 NOTE — Progress Notes (Signed)
Cardiology Office Note:    Date:  05/25/2023   ID:  Luis Larson, DOB 1955-10-02, MRN [id number]  PCP:  Marcine Matar, MD  Cardiologist:  Garwin Brothers, MD   Referring MD: Marcine Matar, MD    ASSESSMENT:    1. Coronary artery disease involving native coronary artery of native heart without angina pectoris   2. Essential hypertension   3. Diabetes mellitus without complication (HCC)   4. Mixed hyperlipidemia   5. Morbid obesity (HCC)    PLAN:    In order of problems listed above:  Coronary artery disease: Secondary prevention stressed with the patient.  Importance of compliance with diet medication stressed and vocalized understanding.  He mentions to me that he walks half an hour a day on a daily basis without any symptoms.  Discussed with the wife. Essential hypertension: Blood pressure stable and diet was emphasized.  Lifestyle modification urged. Mixed dyslipidemia: On lipid-lowering medications.  Lipids reviewed and found to be at goal and I congratulated him about this. Diabetes mellitus and morbid obesity: Lifestyle modification urged.  Diabetes mellitus is under good control.  He promises to do better with diet.  Risks of obesity explained. Patient will be seen in follow-up appointment in 9 months or earlier if the patient has any concerns.    Medication Adjustments/Labs and Tests Ordered: Current medicines are reviewed at length with the patient today.  Concerns regarding medicines are outlined above.  Orders Placed This Encounter  Procedures   EKG 12-Lead   No orders of the defined types were placed in this encounter.    No chief complaint on file.    History of Present Illness:    Luis Larson is a 68 y.o. male.  Patient has past medical history of artery disease, essential hypertension, mixed dyslipidemia, diabetes mellitus, morbid obesity.  He denies any problems at this time and takes care of activities of daily living.  No chest pain orthopnea  or PND.  At the time of my evaluation, the patient is alert awake oriented and in no distress.  Past Medical History:  Diagnosis Date   Acute DM type 2 causing complication (HCC) 06/12/2022   Chronic pain of both shoulders 03/19/2019   Colon cancer screening 10/04/2013   Coronary artery disease 06/12/2022   Coronary artery disease involving native coronary artery of native heart without angina pectoris 01/21/2022   Diabetes (HCC) 12/22/2012   Diabetes mellitus without complication (HCC)    Dyspnea on exertion 12/09/2021   Essential hypertension 12/22/2012   Essential hypertension, benign 12/22/2012   Former smoker 08/26/2020   Hemoptysis 06/12/2022   Hyperlipidemia 12/16/2017   Influenza vaccine needed 03/13/2020   Insomnia 03/13/2020   Morbid obesity (HCC) 07/24/2019   Neuropathy, arm, 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   Other male erectile dysfunction 10/04/2013   PAD (peripheral artery disease) (HCC) 02/09/2021   Primary osteoarthritis of both shoulders 07/24/2019   Type II or unspecified type diabetes mellitus without mention of complication, uncontrolled 10/17/2012   Unstable angina [facility])     Past Surgical History:  Procedure Laterality Date   CORONARY PRESSURE/FFR STUDY N/A 12/17/2021   Procedure: INTRAVASCULAR PRESSURE WIRE/FFR STUDY;  Surgeon: Kathleene Hazel, MD;  [facility];  Service: Cardiovascular;  Laterality: N/A;   CORONARY STENT INTERVENTION N/A 12/17/2021   Procedure: CORONARY STENT INTERVENTION;  Surgeon: Kathleene Hazel, MD;  Location: [facility];  Service: Cardiovascular;  Laterality: N/A;   LEFT HEART CATH AND CORONARY ANGIOGRAPHY N/A  12/17/2021   Procedure: LEFT HEART CATH AND CORONARY ANGIOGRAPHY;  Surgeon: Kathleene Hazel, MD;  Location: [facility];  Service: Cardiovascular;  Laterality: N/A;    Current Medications: Current Meds  Medication Sig   albuterol (VENTOLIN HFA) 108 (90 Base) MCG/ACT inhaler TAKE 2 PUFFS BY  MOUTH EVERY 6 HOURS AS NEEDED FOR WHEEZE OR SHORTNESS OF BREATH   amLODipine (NORVASC) 10 MG tablet TAKE 1 TABLET BY MOUTH EVERY DAY   aspirin EC 81 MG tablet Take 1 tablet (81 mg total) by mouth daily.   Continuous Glucose Sensor (FREESTYLE LIBRE 3 PLUS SENSOR) MISC Change sensor every 15 days.   dapagliflozin propanediol (FARXIGA) 10 MG TABS tablet Take 1 tablet (10 mg total) by mouth daily before breakfast.   glimepiride (AMARYL) 4 MG tablet Take 1 tablet (4 mg total) by mouth daily with breakfast.   hydrochlorothiazide (HYDRODIURIL) 25 MG tablet TAKE 1 TABLET (25 MG TOTAL) BY MOUTH DAILY.   lisinopril (ZESTRIL) 30 MG tablet Take 1 tablet (30 mg total) by mouth daily.   metFORMIN (GLUCOPHAGE-XR) 500 MG 24 hr tablet Take 1 tablet (500 mg total) by mouth daily with breakfast.   nitroGLYCERIN (NITROSTAT) 0.4 MG SL tablet Place 0.4 mg under the tongue every 5 (five) minutes as needed for chest pain.   Semaglutide, 1 MG/DOSE, 4 MG/3ML SOPN Inject 1 mg as directed once a week.   sildenafil (VIAGRA) 100 MG tablet TAKE 1/2 TO 1 TAB BY MOUTH 1 HOUR PRIOR TO INTERCOURSE AS NEEDED     Allergies:   Shellfish allergy   Social History   Socioeconomic History   Marital status: Single    Spouse name: Not on file   Number of children: Not on file   Years of education: Not on file   Highest education level: Not on file  Occupational History   Not on file  Tobacco Use   Smoking status: Former    Current packs/day: 0.00    Types: Cigarettes    Quit date: 04/2020    Years since quitting: 3.1   Smokeless tobacco: Current  Vaping Use   Vaping status: Some Days  Substance and Sexual Activity   Alcohol use: Never   Drug use: Yes    Types: Marijuana   Sexual activity: Yes  Other Topics Concern   Not on file  Social History Narrative   ** Merged History Encounter **       Social Drivers of Health   Financial Resource Strain: Low Risk  (06/25/2022)   Overall Financial Resource Strain (CARDIA)     Difficulty of Paying Living Expenses: Not hard at all  Food Insecurity: No Food Insecurity (06/25/2022)   Hunger Vital Sign    Worried About Running Out of Food in the Last Year: Never true    Ran Out of Food in the Last Year: Never true  Transportation Needs: No Transportation Needs (06/25/2022)   PRAPARE - Administrator, Civil Service (Medical): No    Lack of Transportation (Non-Medical): No  Physical Activity: Insufficiently Active (06/25/2022)   Exercise Vital Sign    Days of Exercise per Week: 2 days    Minutes of Exercise per Session: 30 min  Stress: No Stress Concern Present (06/25/2022)   [employer] of Occupational Health - Occupational Stress Questionnaire    Feeling of Stress : Not at all  Social Connections: Moderately Integrated (06/25/2022)   Social Connection and Isolation Panel [NHANES]    Frequency of Communication  with Friends and Family: More than three times a week    Frequency of Social Gatherings with Friends and Family: Once a week    Attends Religious Services: More than 4 times per year    Active Member of [employer] or Organizations: No    Attends Banker Meetings: Never    Marital Status: Living with partner     Family History: The patient's family history includes Diabetes in his mother; Heart disease in his father and mother.  ROS:   Please see the history of present illness.    All other systems reviewed and are negative.  EKGs/Labs/Other Studies Reviewed:    The following studies were reviewed today: .Marland KitchenEKG Interpretation Date/Time:  Wednesday May 25 2023 10:50:43 EST Ventricular Rate:  52 PR Interval:  162 QRS Duration:  104 QT Interval:  414 QTC Calculation: 385 R Axis:   53  Text Interpretation: Sinus bradycardia Incomplete right bundle branch block Possible Anteroseptal infarct (cited on or before 25-May-2023) When compared with ECG of 17-Dec-2021 08:42, No significant change was found Confirmed by Belva Crome [phone]) on 05/25/2023 11:14:07 AM     Recent Labs: 05/16/2023: ALT 21; BUN 18; Creatinine, Ser 1.02; Hemoglobin 15.4; Platelets 276; Potassium 4.6; Sodium 139  Recent Lipid Panel    Component Value Date/Time   CHOL 122 05/16/2023 1519   TRIG 162 (H) 05/16/2023 1519   HDL 43 05/16/2023 1519   CHOLHDL 2.8 05/16/2023 1519   CHOLHDL 4.1 09/16/2014 1744   VLDL 22 09/16/2014 1744   LDLCALC 52 05/16/2023 1519    Physical Exam:    VS:  BP 130/70   Pulse (!) 52   Ht 5\' 11"  (1.803 m)   Wt 297 lb 1.9 oz (134.8 kg)   SpO2 92%   BMI 41.44 kg/m     Wt Readings from Last 3 Encounters:  05/25/23 297 lb 1.9 oz (134.8 kg)  05/16/23 297 lb (134.7 kg)  12/23/22 (!) 306 lb (138.8 kg)     GEN: Patient is in no acute distress HEENT: Normal NECK: No JVD; No carotid bruits LYMPHATICS: No lymphadenopathy CARDIAC: Hear sounds regular, 2/6 systolic murmur at the apex. RESPIRATORY:  Clear to auscultation without rales, wheezing or rhonchi  ABDOMEN: Soft, non-tender, non-distended MUSCULOSKELETAL:  No edema; No deformity  SKIN: Warm and dry NEUROLOGIC:  Alert and oriented x 3 PSYCHIATRIC:  Normal affect   Signed, Garwin Brothers, MD  05/25/2023 11:17 AM    [facility]

## 2023-05-25 NOTE — Patient Instructions (Signed)
Medication Instructions:  Your physician recommends that you continue on your current medications as directed. Please refer to the Current Medication list given to you today.  *If you need a refill on your cardiac medications before your next appointment, please call your pharmacy*   Lab Work: None Ordered If you have labs (blood work) drawn today and your tests are completely normal, you will receive your results only by: MyChart Message (if you have MyChart) OR A paper copy in the mail If you have any lab test that is abnormal or we need to change your treatment, we will call you to review the results.   Testing/Procedures: None Ordered   Follow-Up: At [facility], you and your health needs are our priority.  As part of our continuing mission to provide you with exceptional heart care, we have created designated Provider Care Teams.  These Care Teams include your primary Cardiologist (physician) and Advanced Practice Providers (APPs -  Physician Assistants and Nurse Practitioners) who all work together to provide you with the care you need, when you need it.  We recommend signing up for the patient portal called "MyChart".  Sign up information is provided on this After Visit Summary.  MyChart is used to connect with patients for Virtual Visits (Telemedicine).  Patients are able to view lab/test results, encounter notes, upcoming appointments, etc.  Non-urgent messages can be sent to your provider as well.   To learn more about what you can do with MyChart, go to [email or web address].    Your next appointment:   9 month follow up

## 2023-07-19 ENCOUNTER — Ambulatory Visit: Payer: Medicare HMO | Attending: Internal Medicine

## 2023-07-19 ENCOUNTER — Other Ambulatory Visit: Payer: Self-pay | Admitting: Internal Medicine

## 2023-07-19 DIAGNOSIS — I152 Hypertension secondary to endocrine disorders: Secondary | ICD-10-CM

## 2023-07-19 DIAGNOSIS — I2510 Atherosclerotic heart disease of native coronary artery without angina pectoris: Secondary | ICD-10-CM

## 2023-07-19 NOTE — Telephone Encounter (Signed)
 Requested Prescriptions  Pending Prescriptions Disp Refills   rosuvastatin (CRESTOR) 40 MG tablet [Pharmacy Med Name: ROSUVASTATIN CALCIUM 40 MG TAB] 90 tablet 1    Sig: TAKE 1 TABLET BY MOUTH EVERY DAY     Cardiovascular:  Antilipid - Statins 2 Failed - 07/19/2023  4:31 PM      Failed - Lipid Panel in normal range within the last 12 months    Cholesterol, Total  Date Value Ref Range Status  05/16/2023 122 100 - 199 mg/dL Final   LDL Chol Calc (NIH)  Date Value Ref Range Status  05/16/2023 52 0 - 99 mg/dL Final   HDL  Date Value Ref Range Status  05/16/2023 43 >39 mg/dL Final   Triglycerides  Date Value Ref Range Status  05/16/2023 162 (H) 0 - 149 mg/dL Final         Passed - Cr in normal range and within 360 days    Creatinine  Date Value Ref Range Status  07/24/2019 66.8 20.0 - 300.0 mg/dL Final   Creat  Date Value Ref Range Status  09/16/2014 0.87 0.50 - 1.35 mg/dL Final   Creatinine, Ser  Date Value Ref Range Status  05/16/2023 1.02 0.76 - 1.27 mg/dL Final         Passed - Patient is not pregnant      Passed - Valid encounter within last 12 months    Recent Outpatient Visits           2 months ago Type 2 diabetes mellitus with morbid obesity (HCC)   [place] [facility] A Dept Of [place]. [facility] Jonah Blue B, MD   6 months ago Type 2 diabetes mellitus with morbid obesity [facility])   [place] [facility] - A Dept Of [place]. [facility] Marcine Matar, MD   8 months ago Encounter for medication review and counseling   [place] [facility] [place] - A Dept Of [place]. [facility] Lois Huxley, Aubrey L, RPH-CPP   8 months ago Type 2 diabetes mellitus with morbid obesity [facility])   [place] [facility] - A Dept Of [place]. [facility] Marcine Matar, MD   1 year ago Encounter for [employer] annual wellness exam   [place] [facility] [place] - A Dept Of [place]. [facility] Marcine Matar, MD       Future Appointments             In 6 days Madelyn Brunner, DO [place]   In 2 months Laural Benes, Binnie Rail, MD [facility] - A Dept Of Eligha Bridegroom. [facility]             glimepiride (AMARYL) 4 MG tablet [Pharmacy Med Name: GLIMEPIRIDE 4 MG TABLET] 90 tablet 1    Sig: TAKE 1 TABLET BY MOUTH EVERY DAY WITH BREAKFAST     Endocrinology:  Diabetes - Sulfonylureas Passed - 07/19/2023  4:31 PM      Passed - HBA1C is between 0 and 7.9 and within 180 days    HbA1c, POC (controlled diabetic range)  Date Value Ref Range Status  05/16/2023 6.2 0.0 - 7.0 % Final         Passed - Cr in normal range and within 360 days    Creatinine  Date Value Ref Range Status  07/24/2019 66.8 20.0 - 300.0 mg/dL Final  Creat  Date Value Ref Range Status  09/16/2014 0.87 0.50 - 1.35 mg/dL Final   Creatinine, Ser  Date Value Ref Range Status  05/16/2023 1.02 0.76 - 1.27 mg/dL Final         Passed - Valid encounter within last 6 months    Recent Outpatient Visits           2 months ago Type 2 diabetes mellitus with morbid obesity (HCC)   [place] [facility] - A Dept Of [place]. [facility] Jonah Blue B, MD   6 months ago Type 2 diabetes mellitus with morbid obesity [facility])   [place] [facility] - A Dept Of [place]. [facility] Marcine Matar, MD   8 months ago Encounter for medication review and counseling   [place] [facility] [place] - A Dept Of [place]. [facility] Lois Huxley, Urie L, RPH-CPP   8 months ago Type 2 diabetes mellitus with morbid obesity [facility])   [place] [facility] - A Dept Of [place]. [facility] Marcine Matar, MD   1 year ago Encounter for [employer] annual wellness exam   [place] [facility] [place] - A Dept Of [place]. [facility] Marcine Matar, MD       Future  Appointments             In 6 days Madelyn Brunner, DO [place]   In 2 months Laural Benes, Binnie Rail, MD [facility] - A Dept Of Eligha Bridegroom. [facility]             lisinopril (ZESTRIL) 30 MG tablet [Pharmacy Med Name: LISINOPRIL 30 MG TABLET] 90 tablet 1    Sig: TAKE 1 TABLET BY MOUTH EVERY DAY     Cardiovascular:  ACE Inhibitors Passed - 07/19/2023  4:31 PM      Passed - Cr in normal range and within 180 days    Creatinine  Date Value Ref Range Status  07/24/2019 66.8 20.0 - 300.0 mg/dL Final   Creat  Date Value Ref Range Status  09/16/2014 0.87 0.50 - 1.35 mg/dL Final   Creatinine, Ser  Date Value Ref Range Status  05/16/2023 1.02 0.76 - 1.27 mg/dL Final         Passed - K in normal range and within 180 days    Potassium  Date Value Ref Range Status  05/16/2023 4.6 3.5 - 5.2 mmol/L Final         Passed - Patient is not pregnant      Passed - Last BP in normal range    BP Readings from Last 1 Encounters:  05/25/23 130/70         Passed - Valid encounter within last 6 months    Recent Outpatient Visits           2 months ago Type 2 diabetes mellitus with morbid obesity (HCC)   [place] [facility] - A Dept Of [place]. [facility] Jonah Blue B, MD   6 months ago Type 2 diabetes mellitus with morbid obesity [facility])   [place] [facility] - A Dept Of [place]. [facility] Marcine Matar, MD   8 months ago Encounter for medication review and counseling   [place] [facility] [place] - A Dept Of [place]. [facility] Eva, Cornelius Moras,  RPH-CPP   8 months ago Type 2 diabetes mellitus with morbid obesity (HCC)   [place] [facility] - A Dept Of [place]. [facility] Marcine Matar, MD   1 year ago Encounter for [employer] annual wellness exam   [place] [facility] [place] - A Dept Of [place]. [facility] Marcine Matar, MD       Future Appointments             In 6 days Madelyn Brunner, DO [place]   In 2 months Laural Benes, Binnie Rail, MD [facility] Health [facility] - A Dept Of Eligha Bridegroom. [facility]             metFORMIN (GLUCOPHAGE-XR) 500 MG 24 hr tablet [Pharmacy Med Name: METFORMIN HCL ER 500 MG TABLET] 90 tablet 1    Sig: TAKE 1 TABLET BY MOUTH EVERY DAY WITH BREAKFAST     Endocrinology:  Diabetes - Biguanides Failed - 07/19/2023  4:31 PM      Failed - B12 Level in normal range and within 720 days    No results found for: "VITAMINB12"       Failed - CBC within normal limits and completed in the last 12 months    WBC  Date Value Ref Range Status  05/16/2023 13.1 (H) 3.4 - 10.8 x10E3/uL Final  06/06/2013 7.9 4.0 - 10.5 K/uL Final   RBC  Date Value Ref Range Status  05/16/2023 5.43 4.14 - 5.80 x10E6/uL Final  06/06/2013 5.71 4.22 - 5.81 MIL/uL Final   Hemoglobin  Date Value Ref Range Status  05/16/2023 15.4 13.0 - 17.7 g/dL Final   Hematocrit  Date Value Ref Range Status  05/16/2023 47.7 37.5 - 51.0 % Final   MCHC  Date Value Ref Range Status  05/16/2023 32.3 31.5 - 35.7 g/dL Final  09/81/1914 78.2 30.0 - 36.0 g/dL Final   [facility]  Date Value Ref Range Status  05/16/2023 28.4 26.6 - 33.0 pg Final  06/06/2013 29.1 26.0 - 34.0 pg Final   MCV  Date Value Ref Range Status  05/16/2023 88 79 - 97 fL Final   No results found for: "PLTCOUNTKUC", "LABPLAT", "POCPLA" RDW  Date Value Ref Range Status  05/16/2023 14.8 11.6 - 15.4 % Final         Passed - Cr in normal range and within 360 days    Creatinine  Date Value Ref Range Status  07/24/2019 66.8 20.0 - 300.0 mg/dL Final   Creat  Date Value Ref Range Status  09/16/2014 0.87 0.50 - 1.35 mg/dL Final   Creatinine, Ser  Date Value Ref Range Status  05/16/2023 1.02 0.76 - 1.27 mg/dL Final         Passed - HBA1C is between 0 and 7.9 and within 180 days    HbA1c, POC (controlled  diabetic range)  Date Value Ref Range Status  05/16/2023 6.2 0.0 - 7.0 % Final         Passed - eGFR in normal range and within 360 days    GFR, Est African American  Date Value Ref Range Status  09/16/2014 >89 mL/min Final   GFR calc Af Amer  Date Value Ref Range Status  03/13/2020 93 >59 mL/min/1.73 Final    Comment:    **In accordance with recommendations from the NKF-ASN Task force,**   [facility] is in the process of updating its eGFR calculation to the   2021 CKD-EPI  creatinine equation that estimates kidney function   without a race variable.    GFR, Est Non African American  Date Value Ref Range Status  09/16/2014 >89 mL/min Final    Comment:      The estimated GFR is a calculation valid for adults (>=29 years old) that uses the CKD-EPI algorithm to adjust for age and sex. It is   not to be used for children, pregnant women, hospitalized patients,    patients on dialysis, or with rapidly changing kidney function. According to the NKDEP, eGFR >89 is normal, 60-89 shows mild impairment, 30-59 shows moderate impairment, 15-29 shows severe impairment and <15 is ESRD.      GFR calc non Af Amer  Date Value Ref Range Status  03/13/2020 80 >59 mL/min/1.73 Final   eGFR  Date Value Ref Range Status  05/16/2023 82 >59 mL/min/1.73 Final         Passed - Valid encounter within last 6 months    Recent Outpatient Visits           2 months ago Type 2 diabetes mellitus with morbid obesity (HCC)   [place] [facility] Dept Of [place]. [facility] Jonah Blue B, MD   6 months ago Type 2 diabetes mellitus with morbid obesity [facility])   [place] [facility] - A Dept Of [place]. [facility] Marcine Matar, MD   8 months ago Encounter for medication review and counseling   [place] [facility] [place] - A Dept Of [place]. [facility] Lois Huxley, Simpson L, RPH-CPP   8 months ago Type 2 diabetes mellitus with  morbid obesity [facility])   [place] [facility] - A Dept Of [place]. [facility] Marcine Matar, MD   1 year ago Encounter for [employer] annual wellness exam   [place] [facility] [place] - A Dept Of [place]. [facility] Marcine Matar, MD       Future Appointments             In 6 days Madelyn Brunner, DO [place]   In 2 months Laural Benes, Binnie Rail, MD [facility] - A Dept Of Eligha Bridegroom. [facility]

## 2023-07-21 DIAGNOSIS — H25043 Posterior subcapsular polar age-related cataract, bilateral: Secondary | ICD-10-CM | POA: Diagnosis not present

## 2023-07-21 DIAGNOSIS — Z0100 Encounter for examination of eyes and vision without abnormal findings: Secondary | ICD-10-CM | POA: Diagnosis not present

## 2023-07-21 LAB — HM DIABETES EYE EXAM

## 2023-07-25 ENCOUNTER — Ambulatory Visit (INDEPENDENT_AMBULATORY_CARE_PROVIDER_SITE_OTHER): Admitting: Sports Medicine

## 2023-07-25 ENCOUNTER — Encounter: Payer: Self-pay | Admitting: Sports Medicine

## 2023-07-25 DIAGNOSIS — M12811 Other specific arthropathies, not elsewhere classified, right shoulder: Secondary | ICD-10-CM

## 2023-07-25 DIAGNOSIS — M5412 Radiculopathy, cervical region: Secondary | ICD-10-CM

## 2023-07-25 DIAGNOSIS — M25511 Pain in right shoulder: Secondary | ICD-10-CM | POA: Diagnosis not present

## 2023-07-25 DIAGNOSIS — M792 Neuralgia and neuritis, unspecified: Secondary | ICD-10-CM

## 2023-07-25 DIAGNOSIS — M12812 Other specific arthropathies, not elsewhere classified, left shoulder: Secondary | ICD-10-CM | POA: Diagnosis not present

## 2023-07-25 DIAGNOSIS — G8929 Other chronic pain: Secondary | ICD-10-CM

## 2023-07-25 MED ORDER — METHYLPREDNISOLONE ACETATE 40 MG/ML IJ SUSP
40.0000 mg | INTRAMUSCULAR | Status: AC | PRN
  Administered 2023-07-25: 40 mg via INTRA_ARTICULAR

## 2023-07-25 MED ORDER — BUPIVACAINE HCL 0.25 % IJ SOLN
2.0000 mL | INTRAMUSCULAR | Status: AC | PRN
  Administered 2023-07-25: 2 mL via INTRA_ARTICULAR

## 2023-07-25 MED ORDER — LIDOCAINE HCL 1 % IJ SOLN
2.0000 mL | INTRAMUSCULAR | Status: AC | PRN
  Administered 2023-07-25: 2 mL

## 2023-07-25 NOTE — Progress Notes (Signed)
 Patient says that his shoulder pain returned about 2 weeks ago and that he still has numbness. He says that he has debated doing surgery for his shoulder but is not sure if it is worth it with the long recovery.

## 2023-07-25 NOTE — Progress Notes (Signed)
 Luis Larson - 68 y.o. male MRN [id number]  Date of birth: December 06, 1955  Office Visit Note: Visit Date: 07/25/2023 PCP: Marcine Matar, MD Referred by: Marcine Matar, MD  Subjective: Chief Complaint  Patient presents with   Right Shoulder - Follow-up   HPI: Luis Larson is a pleasant 68 y.o. male who presents today for follow-up of chronic right shoulder pain, also with EMG/NCS review.  Right shoulder -he has known rupture of the proximal bicep tendon as well as a full-thickness tear of the supraspinatus.  Back in January of this year we did proceed with subacromial joint injection which gave him good relief until a few weeks ago.  He has pain with reaching maneuvers.  He is contemplating surgical repair, but would like to hold off for now.  Right hand/finger weakness -this persists.  He will have episodes of pain shooting all the way down from the neck/shoulder into the 4th and 5th digit.  He notes weakness that is chronic with the pinky finger and certain gripping motions.  He did have his EMG/nerve conduction study, see results below which were reviewed today.  He is a type-II diabetic, but well-controlled.  Lab Results  Component Value Date   HGBA1C 6.2 05/16/2023   Pertinent ROS were reviewed with the patient and found to be negative unless otherwise specified above in HPI.   Assessment & Plan: Visit Diagnoses:  1. Chronic right shoulder pain   2. Radiculopathy, cervical region   3. Radicular pain in right arm   4. Rotator cuff arthropathy of both shoulders    Plan: Impression is exacerbation of chronic right shoulder pain with full-thickness supraspinatus tearing and biceps proximal tendon rupture.  He did get relief in the past from both subacromial and bicep related injections.  Through shared decision making, we did proceed with subacromial joint injection into the right shoulder.  We will see how he does over the next few weeks, could consider bicep tendon injection  if all of his anterior shoulder pain is not improved follow-up.  We did review his nerve conduction study/EMG which shows likely evidence of C8 radiculopathy, but also with muscle weakness of the abductor pollicis brevis as well as dorsal interosseous muscles.  We will move forward with cervical MRI to evaluate any stenosis or impingement of the C8 nerve or other surrounding pathology, this was ordered today.  He will continue maintaining good glucose control with his semaglutide as well as glimepiride 4 mg with breakfast. F/u in 2 weeks.   Follow-up: 2 weeks    Meds & Orders: No orders of the defined types were placed in this encounter.   Orders Placed This Encounter  Procedures   Large Joint Inj   MR Cervical Spine w/o contrast     Procedures: Large Joint Inj: R subacromial bursa on 07/25/2023 9:02 AM Indications: pain Details: 22 G 1.5 in needle, posterior approach Medications: 2 mL lidocaine 1 %; 2 mL bupivacaine 0.25 %; 40 mg methylPREDNISolone acetate 40 MG/ML Outcome: tolerated well, no immediate complications  Subacromial Joint Injection, Right Shoulder After discussion on risks/benefits/indications, informed verbal consent was obtained. A timeout was then performed. Patient was seated on table in exam room. The patient's shoulder was prepped with betadine and alcohol swabs and utilizing posterior approach a 22G, 1.5" needle was directed anteriorly and laterally into the patient's subacromial space was injected with 2:2:1 mixture of lidocaine:bupivicaine:depomedrol with appreciation of free-flowing of the injectate into the bursal space. Patient tolerated the procedure well  without immediate complications.   Procedure, treatment alternatives, risks and benefits explained, specific risks discussed. Consent was given by the patient. Immediately prior to procedure a time out was called to verify the correct patient, procedure, equipment, support staff and site/side marked as required.  Patient was prepped and draped in the usual sterile fashion.          Clinical History: No specialty comments available.  He reports that he quit smoking about 3 years ago. His smoking use included cigarettes. He uses smokeless tobacco.  Recent Labs    10/26/22 1019 05/16/23 1423  HGBA1C 9.8* 6.2   *EMG/NCV reviewed today from 05/13/23:  EMG & NCV Findings: Evaluation of the right median motor nerve showed reduced amplitude (4.9 mV).  The right median (across palm) sensory nerve showed prolonged distal peak latency (Wrist, 3.9 ms).  All remaining nerves (as indicated in the following tables) were within normal limits.     Needle evaluation of the right abductor pollicis brevis muscle showed diminished recruitment.  The right first dorsal interosseous muscle showed increased insertional activity, slightly increased spontaneous activity, slightly increased polyphasic potentials, and diminished recruitment.  The right Ext Digitorum muscle showed increased insertional activity, slightly increased spontaneous activity, and slightly increased polyphasic potentials.  All remaining muscles (as indicated in the following table) showed no evidence of electrical instability.     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  Cannot rule out a lower cord brachial plexus stretch injury.     There is no significant electrodiagnostic evidence of any other focal nerve entrapment or generalized peripheral neuropathy.    Recommendations: 1.  Follow-up with referring physician. 2.  Continue current management of symptoms. 3.  Suggest cervical MRI and clinical correlation is paramount.  Objective:    Physical Exam  Gen: Well-appearing, in no acute distress; non-toxic CV: Well-perfused. Warm.  Resp: Breathing unlabored on room air; no wheezing. Psych:  Fluid speech in conversation; appropriate affect; normal thought process  Ortho Exam - Right shoulder: + TTP at Codman's point as well as a lesser degree in the bicipital groove.  There is pain with abduction and resisted ER. There is no redness, swelling, or effusion.  - Cervical: No midliness SP TTP. Equivocal spurling's test on the right.  There is some mild restriction with rotation both right and left.  There is weakness in the C8 myotome with wrist flexion and abduction of the fingers.  Imaging:  Narrative & Impression  CLINICAL DATA:  Chronic right shoulder pain. Decreased range of motion.   EXAM: MRI OF THE RIGHT SHOULDER WITHOUT CONTRAST   TECHNIQUE: Multiplanar, multisequence MR imaging of the shoulder was performed. No intravenous contrast was administered.   COMPARISON:  None Available.   FINDINGS: Rotator cuff: Mild tendinosis of the supraspinatus tendon with a 12 mm full-thickness tear along the anterior aspect and 9 mm of retraction. Moderate tendinosis of the infraspinatus tendon with a interstitial tear at the musculotendinous junction. Teres minor tendon is intact. Mild tendinosis of the subscapularis tendon with a partial-thickness tear.   Muscles: No muscle atrophy or edema. No intramuscular fluid collection or hematoma.   Biceps Long Head: Complete tear of the proximal long head of the biceps tendon with a stump of tissue attached at the bicipito-labral complex.   Acromioclavicular Joint: Moderate arthropathy of the acromioclavicular joint. No subacromial/subdeltoid bursal fluid.   Glenohumeral  Joint: Small joint effusion. Partial-thickness cartilage loss of the glenohumeral joint.   Labrum: Superior labral degeneration. Posterior labral tear with a 2 mm paralabral cyst.   Bones: No fracture or dislocation. No aggressive osseous lesion.   Other: No fluid collection or hematoma.   IMPRESSION: 1. Mild tendinosis of the supraspinatus tendon with a  12 mm full-thickness tear along the anterior aspect and 9 mm of retraction. 2. Moderate tendinosis of the infraspinatus tendon with a interstitial tear at the musculotendinous junction. 3. Mild tendinosis of the subscapularis tendon with a partial-thickness tear. 4. Complete tear of the proximal long head of the biceps tendon with a stump of tissue attached at the bicipito-labral complex.     Electronically Signed   By: Onnie Bilis M.D.    Past Medical/Family/Surgical/Social History: Medications & Allergies reviewed per EMR, new medications updated. 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   PAD (peripheral artery disease) (HCC) 02/09/2021   Former smoker 08/26/2020   Influenza vaccine needed 03/13/2020   Insomnia 03/13/2020   Primary osteoarthritis of both shoulders 07/24/2019   Morbid obesity (HCC) 07/24/2019   Chronic pain of both shoulders 03/19/2019   Hyperlipidemia 12/16/2017   Other male erectile dysfunction 10/04/2013   Colon cancer screening 10/04/2013   Diabetes (HCC) 12/22/2012   Essential hypertension 12/22/2012   Essential hypertension, benign 12/22/2012   Type II or unspecified type diabetes mellitus without mention of complication, uncontrolled 10/17/2012   Past Medical History:  Diagnosis Date   Acute DM type 2 causing complication (HCC) 06/12/2022   Chronic pain of both shoulders 03/19/2019   Colon cancer screening 10/04/2013   Coronary artery disease 06/12/2022   Coronary artery disease involving  native coronary artery of native heart without angina pectoris 01/21/2022   Diabetes (HCC) 12/22/2012   Diabetes mellitus without complication (HCC)    Dyspnea on exertion 12/09/2021   Essential hypertension 12/22/2012   Essential hypertension, benign 12/22/2012   Former smoker 08/26/2020   Hemoptysis 06/12/2022   Hyperlipidemia 12/16/2017   Influenza vaccine needed 03/13/2020   Insomnia 03/13/2020   Morbid obesity (HCC) 07/24/2019   Neuropathy, arm, 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   Other male erectile dysfunction 10/04/2013   PAD (peripheral artery disease) (HCC) 02/09/2021   Primary osteoarthritis of both shoulders 07/24/2019   Type II or unspecified type diabetes mellitus without mention of complication, uncontrolled 10/17/2012   Unstable angina (HCC)    Family History  Problem Relation Age of Onset   Diabetes Mother    Heart disease Mother    Heart disease Father    Past Surgical History:  Procedure Laterality Date   CORONARY PRESSURE/FFR STUDY N/A 12/17/2021   Procedure: INTRAVASCULAR PRESSURE WIRE/FFR STUDY;  Surgeon: Odie Benne, MD;  Location: [facility];  Service: Cardiovascular;  Laterality: N/A;   CORONARY STENT INTERVENTION N/A 12/17/2021   Procedure: CORONARY STENT INTERVENTION;  Surgeon: Odie Benne, MD;  Location: [facility];  Service: Cardiovascular;  Laterality: N/A;   LEFT HEART CATH AND CORONARY ANGIOGRAPHY N/A 12/17/2021   Procedure: LEFT HEART CATH  AND CORONARY ANGIOGRAPHY;  Surgeon: Odie Benne, MD;  Location: [facility];  Service: Cardiovascular;  Laterality: N/A;   Social History   Occupational History   Not on file  Tobacco Use   Smoking status: Former    Current packs/day: 0.00    Types: Cigarettes    Quit date: 04/2020    Years since quitting: 3.2   Smokeless tobacco: Current  Vaping Use   Vaping status: Some Days  Substance and Sexual Activity   Alcohol use: Never   Drug use: Yes    Types: Marijuana    Sexual activity: Yes

## 2023-08-08 ENCOUNTER — Other Ambulatory Visit: Payer: Self-pay

## 2023-08-08 ENCOUNTER — Other Ambulatory Visit (INDEPENDENT_AMBULATORY_CARE_PROVIDER_SITE_OTHER): Payer: Self-pay

## 2023-08-08 ENCOUNTER — Encounter: Payer: Self-pay | Admitting: Sports Medicine

## 2023-08-08 ENCOUNTER — Ambulatory Visit (INDEPENDENT_AMBULATORY_CARE_PROVIDER_SITE_OTHER): Admitting: Sports Medicine

## 2023-08-08 ENCOUNTER — Other Ambulatory Visit: Payer: Self-pay | Admitting: Internal Medicine

## 2023-08-08 DIAGNOSIS — M5030 Other cervical disc degeneration, unspecified cervical region: Secondary | ICD-10-CM | POA: Diagnosis not present

## 2023-08-08 DIAGNOSIS — G8929 Other chronic pain: Secondary | ICD-10-CM

## 2023-08-08 DIAGNOSIS — M25511 Pain in right shoulder: Secondary | ICD-10-CM

## 2023-08-08 DIAGNOSIS — M5412 Radiculopathy, cervical region: Secondary | ICD-10-CM

## 2023-08-08 DIAGNOSIS — E1169 Type 2 diabetes mellitus with other specified complication: Secondary | ICD-10-CM

## 2023-08-08 DIAGNOSIS — M66321 Spontaneous rupture of flexor tendons, right upper arm: Secondary | ICD-10-CM | POA: Diagnosis not present

## 2023-08-08 NOTE — Progress Notes (Signed)
 Luis Larson - 68 y.o. male MRN [id number]  Date of birth: 01-Oct-1955  Office Visit Note: Visit Date: 08/08/2023 PCP: Lawrance Presume, MD Referred by: Lawrance Presume, MD  Subjective: Chief Complaint  Patient presents with   Right Shoulder - Pain   HPI: Luis Larson is a pleasant 68 y.o. male who presents today for follow-up of right shoulder pain, also with RUE radiculopathy.  Right shoulder - had SAJ injection on 07/25/23 and this was quite helpful for the shoulder. Still has some residual anterior shoulder pain, pointing toward bicipital groove. But overall, feeling shoulder much better.  Chronic right hand/finger numbness - persistent. Had EMG which suggested C8 radiculopathy but also some muscle blunting of RUE. Recommended cervical MRI, has this upcoming on Friday. Will obtain baseline x-rays today.  Pertinent ROS were reviewed with the patient and found to be negative unless otherwise specified above in HPI.   Assessment & Plan: Visit Diagnoses:  1. Chronic right shoulder pain   2. Radiculopathy, cervical region   3. Nontraumatic rupture of right proximal biceps tendon   4. DDD (degenerative disc disease), cervical    Plan: Impression is chronic right shoulder pain with rotator cuff arthropathy and a proximal biceps tendon rupture with residual stump.  He did receive good relief after subacromial joint injection of the shoulder but still having some mild symptomatic pain over the bicep tendon sheath.  He would like to proceed with ultrasound-guided injection today, we did perform this and he tolerated well.  Advised on postinjection protocol.  He may then return to getting back into the gym and continuing his shoulder strengthening exercises after 48 hours from today's injection.  He has chronic numbness and weakness of the 4th and 5th digit of the right hand.  Previous EMG showed findings suggestive of C8 radiculopathy but also some weakness of the extensor muscles in the  forearm/wrist.  We did obtain cervical x-rays today but will be forward with MRI to evaluate for neural impingement to help discuss and guide management. Ok for OTC anti-inflammatories as needed.  Follow-up: Return for I will call/message after MRI to discuss next steps.   Meds & Orders: No orders of the defined types were placed in this encounter.   Orders Placed This Encounter  Procedures   US  Guided Needle Placement - No Linked Charges   XR Cervical Spine 2 or 3 views     Procedures: US -Guided Biceps tendon sheath injection, right shoulder  After discussion on risks/benefits/indications, informed verbal consent was obtained. A timeout was then performed. Patient was placed in supine position on the table in exam room. The patient's anterior shoulder was prepped with betadine and alcohol swabs. Utilizing ultrasound guidance, the biceps tendon sheath was identified in a short-axis view. Using a 22G, 1.5" needle under ultrasound guidance, the tendon sheath was injected from a lateral-medial direction with a 1:1:1 lidocaine :bupivicaine:betamethasone with visualization of injectate flow within the tendon sheath via an in-plane technique. Patient tolerated the procedure well without immediate complications.       Clinical History: No specialty comments available.  He reports that he quit smoking about 3 years ago. His smoking use included cigarettes. He uses smokeless tobacco.  Recent Labs    10/26/22 1019 05/16/23 1423  HGBA1C 9.8* 6.2    Objective:    Physical Exam  Gen: Well-appearing, in no acute distress; non-toxic CV: Well-perfused. Warm.  Resp: Breathing unlabored on room air; no wheezing. Psych: Fluid speech in conversation; appropriate affect; normal thought  process  Ortho Exam - Right shoulder: + mild TTP within the anterior bicipital groove.  Otherwise there is no pain or restriction with range of motion.  Imaging: XR Cervical Spine 2 or 3 views Result Date:  08/08/2023 2 views of the cervical spine including AP and lateral film were ordered and reviewed by myself.  X-ray demonstrates advanced cervical DDD most notable at the C5-C6 level with endplate sclerosis and anterior spurring.  There is a degree of narrowing between the C7-C8 location which is incompletely visualized given the soft tissue.  No acute fracture noted.   Past Medical/Family/Surgical/Social History: Medications & Allergies reviewed per EMR, new medications updated. 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   PAD (peripheral artery disease) (HCC) 02/09/2021   Former smoker 08/26/2020   Influenza vaccine needed 03/13/2020   Insomnia 03/13/2020   Primary osteoarthritis of both shoulders 07/24/2019   Morbid obesity (HCC) 07/24/2019   Chronic pain of both shoulders 03/19/2019   Hyperlipidemia 12/16/2017   Other male erectile dysfunction 10/04/2013   Colon cancer screening 10/04/2013   Diabetes (HCC) 12/22/2012   Essential hypertension 12/22/2012   Essential hypertension, benign 12/22/2012   Type II or unspecified type diabetes mellitus without mention of complication, uncontrolled 10/17/2012   Past Medical History:  Diagnosis Date   Acute DM type 2 causing complication (HCC) 06/12/2022   Chronic pain of both shoulders 03/19/2019   Colon cancer screening 10/04/2013   Coronary artery disease 06/12/2022   Coronary artery disease involving native coronary artery of native heart without angina pectoris 01/21/2022    Diabetes (HCC) 12/22/2012   Diabetes mellitus without complication (HCC)    Dyspnea on exertion 12/09/2021   Essential hypertension 12/22/2012   Essential hypertension, benign 12/22/2012   Former smoker 08/26/2020   Hemoptysis 06/12/2022   Hyperlipidemia 12/16/2017   Influenza vaccine needed 03/13/2020   Insomnia 03/13/2020   Morbid obesity (HCC) 07/24/2019   Neuropathy, arm, 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   Other male erectile dysfunction 10/04/2013   PAD (peripheral artery disease) (HCC) 02/09/2021   Primary osteoarthritis of both shoulders 07/24/2019   Type II or unspecified type diabetes mellitus without mention of complication, uncontrolled 10/17/2012   Unstable angina (HCC)    Family History  Problem Relation Age of Onset   Diabetes Mother    Heart disease Mother    Heart disease Father    Past Surgical History:  Procedure Laterality Date   CORONARY PRESSURE/FFR STUDY N/A 12/17/2021   Procedure: INTRAVASCULAR PRESSURE WIRE/FFR STUDY;  Surgeon: Odie Benne, MD;  Location: [facility];  Service: Cardiovascular;  Laterality: N/A;   CORONARY STENT INTERVENTION N/A 12/17/2021   Procedure: CORONARY STENT INTERVENTION;  Surgeon: Odie Benne, MD;  Location: [facility];  Service: Cardiovascular;  Laterality: N/A;   LEFT HEART CATH AND CORONARY ANGIOGRAPHY N/A 12/17/2021   Procedure: LEFT HEART CATH AND CORONARY ANGIOGRAPHY;  Surgeon: Odie Benne, MD;  Location: [facility];  Service: Cardiovascular;  Laterality: N/A;   Social History   Occupational History   Not on file  Tobacco Use   Smoking status: Former    Current packs/day: 0.00    Types: Cigarettes    Quit date: 04/2020    Years since quitting: 3.3   Smokeless tobacco: Current  Vaping Use   Vaping status: Some Days  Substance and Sexual Activity   Alcohol use: Never   Drug use: Yes    Types: Marijuana   Sexual activity: Yes

## 2023-08-10 ENCOUNTER — Encounter: Payer: Self-pay | Admitting: Sports Medicine

## 2023-08-12 ENCOUNTER — Other Ambulatory Visit

## 2023-08-15 ENCOUNTER — Encounter: Payer: Self-pay | Admitting: Sports Medicine

## 2023-08-28 ENCOUNTER — Other Ambulatory Visit: Payer: Self-pay | Admitting: Internal Medicine

## 2023-08-28 DIAGNOSIS — E1169 Type 2 diabetes mellitus with other specified complication: Secondary | ICD-10-CM

## 2023-08-29 ENCOUNTER — Ambulatory Visit
Admission: RE | Admit: 2023-08-29 | Discharge: 2023-08-29 | Disposition: A | Discharge status: Home / Self Care | Source: Ambulatory Visit | Attending: Sports Medicine | Admitting: Sports Medicine

## 2023-08-29 DIAGNOSIS — M5412 Radiculopathy, cervical region: Secondary | ICD-10-CM

## 2023-08-29 DIAGNOSIS — M792 Neuralgia and neuritis, unspecified: Secondary | ICD-10-CM

## 2023-08-29 DIAGNOSIS — M4802 Spinal stenosis, cervical region: Secondary | ICD-10-CM | POA: Diagnosis not present

## 2023-09-01 ENCOUNTER — Other Ambulatory Visit: Payer: Self-pay | Admitting: Internal Medicine

## 2023-09-01 DIAGNOSIS — E1159 Type 2 diabetes mellitus with other circulatory complications: Secondary | ICD-10-CM

## 2023-09-09 ENCOUNTER — Other Ambulatory Visit: Payer: Self-pay | Admitting: Internal Medicine

## 2023-09-09 DIAGNOSIS — E1159 Type 2 diabetes mellitus with other circulatory complications: Secondary | ICD-10-CM

## 2023-09-19 ENCOUNTER — Encounter: Payer: Self-pay | Admitting: Internal Medicine

## 2023-09-19 ENCOUNTER — Ambulatory Visit: Payer: Self-pay | Attending: Internal Medicine | Admitting: Internal Medicine

## 2023-09-19 DIAGNOSIS — I2510 Atherosclerotic heart disease of native coronary artery without angina pectoris: Secondary | ICD-10-CM | POA: Diagnosis not present

## 2023-09-19 DIAGNOSIS — E119 Type 2 diabetes mellitus without complications: Secondary | ICD-10-CM

## 2023-09-19 DIAGNOSIS — Z7985 Long-term (current) use of injectable non-insulin antidiabetic drugs: Secondary | ICD-10-CM

## 2023-09-19 DIAGNOSIS — Z6841 Body Mass Index (BMI) 40.0 and over, adult: Secondary | ICD-10-CM

## 2023-09-19 DIAGNOSIS — I10 Essential (primary) hypertension: Secondary | ICD-10-CM | POA: Diagnosis not present

## 2023-09-19 DIAGNOSIS — I152 Hypertension secondary to endocrine disorders: Secondary | ICD-10-CM

## 2023-09-19 DIAGNOSIS — Z7984 Long term (current) use of oral hypoglycemic drugs: Secondary | ICD-10-CM

## 2023-09-19 LAB — POCT GLYCOSYLATED HEMOGLOBIN (HGB A1C): HbA1c, POC (controlled diabetic range): 6.5 % (ref 0.0–7.0)

## 2023-09-19 MED ORDER — LISINOPRIL 30 MG PO TABS: 30.0000 mg | ORAL_TABLET | Freq: Every day | ORAL | 3 refills | Status: DC

## 2023-09-19 MED ORDER — AMLODIPINE BESYLATE 10 MG PO TABS: 10.0000 mg | ORAL_TABLET | Freq: Every day | ORAL | 3 refills | Status: DC

## 2023-09-19 MED ORDER — SEMAGLUTIDE (2 MG/DOSE) 8 MG/3ML ~~LOC~~ SOPN: 2.0000 mg | PEN_INJECTOR | SUBCUTANEOUS | 4 refills | Status: DC

## 2023-09-19 NOTE — Patient Instructions (Signed)
 VISIT SUMMARY:  Today, we reviewed your current medications and overall health status. Your blood pressure and blood sugar levels are well-controlled. We discussed adjustments to your diabetes medication to help with your recent weight plateau and slight weight gain. We also talked about the need to reschedule your overdue Medicare wellness visit.  YOUR PLAN:  -TYPE 2 DIABETES MELLITUS: Type 2 diabetes is a condition where your body does not use insulin  properly, leading to high blood sugar levels. Your blood sugar is well-controlled with an A1c of 6.5%. We will increase your Ozempic  dose to 2 mg once weekly and discontinue glimepiride . Continue taking metformin  500 mg and Farxiga . Please monitor for any side effects with the increased Ozempic  dose.  -HYPERTENSION: Hypertension, or high blood pressure, is when the force of the blood against your artery walls is too high. Your blood pressure is well-controlled at 125/73 mmHg. We will refill your lisinopril  30 mg and amlodipine  10 mg with a 90-day supply and three refills.  -HYPERLIPIDEMIA: Hyperlipidemia is when you have high levels of fats (lipids) in your blood, which can increase your risk of heart disease. Continue taking rosuvastatin  as prescribed.  -GENERAL HEALTH MAINTENANCE: You are overdue for your Medicare wellness visit. Please reschedule this appointment to stay on top of your general health needs.  INSTRUCTIONS:  Please monitor for any side effects with the increased Ozempic  dose. Reschedule your overdue Medicare wellness visit.

## 2023-09-19 NOTE — Progress Notes (Signed)
 Patient ID: Luis Larson, male    DOB: 1956/01/12  MRN: [id number]  CC: Medical Management of Chronic Issues (Discuss ozempic )   Subjective: Luis Larson is a 68 y.o. male who presents for chronic ds management. His concerns today include:  Pt with hx of HTN, DM, HL, former tob dep (quit 04/2020), ED, underdeveloped pectoralis muscle right side, OA knees and shoulders, emphysematous changes on CT   Discussed the use of AI scribe software for clinical note transcription with the patient, who gave verbal consent to proceed.  History of Present Illness Luis Larson is a 68 year old male with hypertension and type 2 diabetes who presents for medication management and follow-up.  HTN/CAD: His blood pressure is well-controlled with lisinopril  30 mg, hydrochlorothiazide  25 mg, and amlodipine  10 mg daily.  He is taking the rosuvastatin  and aspirin  daily as well.  He denies chest pain, shortness of breath, or leg swelling.  DM: Results for orders placed or performed in visit on 09/19/23  POCT glycosylated hemoglobin (Hb A1C)   Collection Time: 09/19/23  3:19 PM  Result Value Ref Range   Hemoglobin A1C     HbA1c POC (<> result, manual entry)     HbA1c, POC (prediabetic range)     HbA1c, POC (controlled diabetic range) 6.5 0.0 - 7.0 %  He is on Ozempic  1 mg weekly, which initially aided in weight loss, but he has recently experienced a plateau and slight weight gain. His current weight is 280 lbs, down from 308 lbs from when he initially started the Ozempic . He takes metformin  500 mg daily, glimepiride  4 mg, and Farxiga  10 mg for diabetes and heart health. He experiences occasional nausea, which he attributes to irregular eating habits. He has significantly altered his diet, eating once a day and avoiding foods like bread and pasta. His blood sugar levels are well-controlled, with a recent reading of 80. He uses a continuous glucose monitor and is in range 98% of the time over the past week.      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   PAD (peripheral artery disease) (HCC) 02/09/2021   Former smoker 08/26/2020   Influenza vaccine needed 03/13/2020   Insomnia 03/13/2020   Primary osteoarthritis of both shoulders 07/24/2019   Morbid obesity (HCC) 07/24/2019   Chronic pain of both shoulders 03/19/2019   Hyperlipidemia 12/16/2017   Other male erectile dysfunction 10/04/2013   Colon cancer screening 10/04/2013   Diabetes (HCC) 12/22/2012   Essential hypertension 12/22/2012   Essential hypertension, benign 12/22/2012   Type II or unspecified type diabetes mellitus without mention of complication, uncontrolled 10/17/2012     Current Outpatient Medications on File Prior to Visit  Medication Sig Dispense Refill   albuterol  (VENTOLIN  HFA) 108 (90 Base) MCG/ACT inhaler TAKE 2 PUFFS BY MOUTH EVERY 6 HOURS AS NEEDED FOR WHEEZE OR SHORTNESS OF BREATH 8.5 each 2   aspirin  EC 81 MG tablet Take 1 tablet (81 mg total) by mouth daily. 30 tablet    Continuous Glucose Sensor (FREESTYLE LIBRE 3 PLUS SENSOR) MISC  Change sensor every 15 days. 2 each 6   FARXIGA  10 MG TABS tablet TAKE 1 TABLET BY MOUTH DAILY BEFORE BREAKFAST. 90 tablet 1   hydrochlorothiazide  (HYDRODIURIL ) 25 MG tablet TAKE 1 TABLET EVERY DAY 90 tablet 2   metFORMIN  (GLUCOPHAGE -XR) 500 MG 24 hr tablet Take 1 tablet (500 mg total) by mouth daily with breakfast. 90 tablet 1   nitroGLYCERIN  (NITROSTAT ) 0.4 MG SL tablet Place 0.4 mg under the tongue every 5 (five) minutes  as needed for chest pain.     rosuvastatin  (CRESTOR ) 40 MG tablet Take 1 tablet (40 mg total) by mouth daily. 90 tablet 1   sildenafil  (VIAGRA ) 100 MG tablet TAKE 1/2 TO 1 TAB BY MOUTH 1 HOUR PRIOR TO INTERCOURSE AS NEEDED 10 tablet 1   traZODone  (DESYREL ) 50 MG tablet TAKE 0.5-1 TABLETS (25-50 MG TOTAL) BY MOUTH AT BEDTIME AS NEEDED FOR SLEEP. 30 tablet 2   No current facility-administered medications on file prior to visit.    Allergies  Allergen Reactions   Shellfish Allergy Swelling    Social History   Socioeconomic History   Marital status: Single    Spouse name: Not on file   Number of children: Not on file   Years of education: Not on file   Highest education level: Not on file  Occupational History   Not on file  Tobacco Use   Smoking status: Former    Current packs/day: 0.00    Types: Cigarettes    Quit date: 04/2020    Years since quitting: 3.4   Smokeless tobacco: Current  Vaping Use   Vaping status: Some Days  Substance and Sexual Activity   Alcohol use: Never   Drug use: Yes    Types: Marijuana   Sexual activity: Yes  Other Topics Concern   Not on file  Social History Narrative   ** Merged History Encounter **       Social Drivers of Health   Financial Resource Strain: Low Risk  (06/25/2022)   Overall Financial Resource Strain (CARDIA)    Difficulty of Paying Living Expenses: Not hard at all  Food Insecurity: No Food Insecurity (06/25/2022)   Hunger Vital Sign    Worried About Running Out of Food in the Last Year: Never true    Ran Out of Food in the Last Year: Never true  Transportation Needs: No Transportation Needs (06/25/2022)   PRAPARE - Administrator, Civil Service (Medical): No    Lack of Transportation (Non-Medical): No  Physical Activity: Insufficiently Active (06/25/2022)   Exercise Vital Sign    Days of Exercise per Week: 2 days    Minutes of Exercise per Session: 30 min  Stress: No Stress Concern Present (06/25/2022)    [employer] of Occupational Health - Occupational Stress Questionnaire    Feeling of Stress : Not at all  Social Connections: Moderately Integrated (06/25/2022)   Social Connection and Isolation Panel [NHANES]    Frequency of Communication with Friends and Family: More than three times a week    Frequency of Social Gatherings with Friends and Family: Once a week    Attends Religious Services: More than 4 times per year    Active Member of [employer] or Organizations: No    Attends Banker Meetings: Never    Marital Status: Living with partner  Intimate Partner Violence: Not At Risk (06/25/2022)   Humiliation, Afraid, Rape, and Kick questionnaire    Fear of Current or Ex-Partner: No  Emotionally Abused: No    Physically Abused: No    Sexually Abused: No    Family History  Problem Relation Age of Onset   Diabetes Mother    Heart disease Mother    Heart disease Father     Past Surgical History:  Procedure Laterality Date   CORONARY PRESSURE/FFR STUDY N/A 12/17/2021   Procedure: INTRAVASCULAR PRESSURE WIRE/FFR STUDY;  Surgeon: Luis Benne, MD;  Location: [facility];  Service: Cardiovascular;  Laterality: N/A;   CORONARY STENT INTERVENTION N/A 12/17/2021   Procedure: CORONARY STENT INTERVENTION;  Surgeon: Luis Benne, MD;  Location: [facility];  Service: Cardiovascular;  Laterality: N/A;   LEFT HEART CATH AND CORONARY ANGIOGRAPHY N/A 12/17/2021   Procedure: LEFT HEART CATH AND CORONARY ANGIOGRAPHY;  Surgeon: Luis Benne, MD;  Location: [facility];  Service: Cardiovascular;  Laterality: N/A;    ROS: Review of Systems Negative except as stated above  PHYSICAL EXAM: BP 125/73   Pulse (!) 54   Ht 5\' 11"  (1.803 m)   Wt 287 lb 3.2 oz (130.3 kg)   SpO2 93%   BMI 40.06 kg/m   Wt Readings from Last 3 Encounters:  09/19/23 287 lb 3.2 oz (130.3 kg)  05/25/23 297 lb 1.9 oz (134.8 kg)  05/16/23 297 lb (134.7 kg)     Physical Exam   General appearance - alert, well appearing, older Caucasian male and in no distress Mental status - normal mood, behavior, speech, dress, motor activity, and thought processes Neck - supple, no significant adenopathy Chest - clear to auscultation, no wheezes, rales or rhonchi, symmetric air entry Heart - normal rate, regular rhythm, normal S1, S2, no murmurs, rubs, clicks or gallops Ext: no edema    Latest Ref Rng & Units 05/16/2023    3:19 PM 06/25/2022   11:30 AM 01/19/2022   10:30 AM  CMP  Glucose 70 - 99 mg/dL 97  161  096   BUN 8 - 27 mg/dL 18  15  16    Creatinine 0.76 - 1.27 mg/dL 0.45  4.09  8.11   Sodium 134 - 144 mmol/L 139  137  137   Potassium 3.5 - 5.2 mmol/L 4.6  4.2  4.6   Chloride 96 - 106 mmol/L 101  100  98   CO2 20 - 29 mmol/L 22  23  25    Calcium  8.6 - 10.2 mg/dL 9.7  9.2  9.7   Total Protein 6.0 - 8.5 g/dL 6.8   6.8   Total Bilirubin 0.0 - 1.2 mg/dL 0.2   0.3   Alkaline Phos 44 - 121 IU/L 81   87   AST 0 - 40 IU/L 18   17   ALT 0 - 44 IU/L 21   19    Lipid Panel     Component Value Date/Time   CHOL 122 05/16/2023 1519   TRIG 162 (H) 05/16/2023 1519   HDL 43 05/16/2023 1519   CHOLHDL 2.8 05/16/2023 1519   CHOLHDL 4.1 09/16/2014 1744   VLDL 22 09/16/2014 1744   LDLCALC 52 05/16/2023 1519    CBC    Component Value Date/Time   WBC 13.1 (H) 05/16/2023 1519   WBC 7.9 06/06/2013 1156   RBC 5.43 05/16/2023 1519   RBC 5.71 06/06/2013 1156   HGB 15.4 05/16/2023 1519   HCT 47.7 05/16/2023 1519   PLT 276 05/16/2023 1519   MCV 88 05/16/2023 1519   MCH 28.4 05/16/2023 1519  MCH 29.1 06/06/2013 1156   MCHC 32.3 05/16/2023 1519   MCHC 34.6 06/06/2013 1156   RDW 14.8 05/16/2023 1519   LYMPHSABS 2.4 06/06/2013 1156   MONOABS 0.6 06/06/2013 1156   EOSABS 0.2 06/06/2013 1156   BASOSABS 0.0 06/06/2013 1156    ASSESSMENT AND PLAN: 1. Type 2 diabetes mellitus with morbid obesity (HCC) (Primary) At goal.  He is wanting the dose of  Ozempic  increased to achieve further weight loss as he feels he has plateaued.  Overall tolerating Ozempic  okay.  We increased the dose from 1 mg once a week to 2 mg once a week.  Again went over with him side effects to observe for including vomiting, abdominal pain, diarrhea or palpitations.  If any of these occur he should stop the medicine and come in.  Stop Amaryl .  Continue metformin  500 mg daily and Farxiga . - Semaglutide , 2 MG/DOSE, 8 MG/3ML SOPN; Inject 2 mg as directed once a week.  Dispense: 3 mL; Refill: 4 - POCT glycosylated hemoglobin (Hb A1C)  2. Diabetes mellitus treated with oral medication (HCC) See #1 above  3. Long-term (current) use of injectable non-insulin  antidiabetic drugs See #1 above  4. Hypertension associated with type 2 diabetes mellitus (HCC) At goal. Continue Norvasc  and Lisinopril  - amLODipine  (NORVASC ) 10 MG tablet; Take 1 tablet (10 mg total) by mouth daily.  Dispense: 90 tablet; Refill: 3 - lisinopril  (ZESTRIL ) 30 MG tablet; Take 1 tablet (30 mg total) by mouth daily.  Dispense: 90 tablet; Refill: 3  5. Coronary artery disease involving native coronary artery of native heart without angina pectoris Stable.  Continue aspirin  and rosuvastatin .  Not on beta-blocker.  Patient bradycardic in the 50s.  Patient was given the opportunity to ask questions.  Patient verbalized understanding of the plan and was able to repeat key elements of the plan.   This documentation was completed using Paediatric nurse.  Any transcriptional errors are unintentional.  Orders Placed This Encounter  Procedures   POCT glycosylated hemoglobin (Hb A1C)     Requested Prescriptions   Signed Prescriptions Disp Refills   amLODipine  (NORVASC ) 10 MG tablet 90 tablet 3    Sig: Take 1 tablet (10 mg total) by mouth daily.   lisinopril  (ZESTRIL ) 30 MG tablet 90 tablet 3    Sig: Take 1 tablet (30 mg total) by mouth daily.   Semaglutide , 2 MG/DOSE, 8 MG/3ML SOPN 3 mL  4    Sig: Inject 2 mg as directed once a week.    Return in about 4 months (around 01/19/2024) for Medicare Wellness Visit in 2-4   wks with CMA/LPN.  Concetta Dee, MD, FACP

## 2023-09-20 ENCOUNTER — Telehealth: Payer: Self-pay | Admitting: Internal Medicine

## 2023-09-20 NOTE — Telephone Encounter (Signed)
 Copied from CRM [id number]. 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 Please send new order to [facility].  Preferred Pharmacy [facility] - [place], [place] - [address] [place] [address] Phone: [phone] Fax: [phone]

## 2023-09-21 ENCOUNTER — Other Ambulatory Visit: Payer: Self-pay | Admitting: Internal Medicine

## 2023-09-21 ENCOUNTER — Other Ambulatory Visit: Payer: Self-pay | Admitting: Pharmacist

## 2023-09-21 DIAGNOSIS — E1159 Type 2 diabetes mellitus with other circulatory complications: Secondary | ICD-10-CM

## 2023-09-21 MED ORDER — DAPAGLIFLOZIN PROPANEDIOL 10 MG PO TABS: 10.0000 mg | ORAL_TABLET | Freq: Every day | ORAL | 1 refills | Status: DC

## 2023-09-21 MED ORDER — SEMAGLUTIDE (2 MG/DOSE) 8 MG/3ML ~~LOC~~ SOPN: 2.0000 mg | PEN_INJECTOR | SUBCUTANEOUS | 4 refills | Status: DC

## 2023-09-21 MED ORDER — AMLODIPINE BESYLATE 10 MG PO TABS: 10.0000 mg | ORAL_TABLET | Freq: Every day | ORAL | 3 refills | Status: DC

## 2023-09-21 NOTE — Telephone Encounter (Signed)
 Noted! Thank you

## 2023-10-16 ENCOUNTER — Other Ambulatory Visit: Payer: Self-pay | Admitting: Internal Medicine

## 2023-10-21 ENCOUNTER — Ambulatory Visit (INDEPENDENT_AMBULATORY_CARE_PROVIDER_SITE_OTHER): Admitting: Sports Medicine

## 2023-10-21 DIAGNOSIS — M25511 Pain in right shoulder: Secondary | ICD-10-CM | POA: Diagnosis not present

## 2023-10-21 DIAGNOSIS — M5030 Other cervical disc degeneration, unspecified cervical region: Secondary | ICD-10-CM | POA: Diagnosis not present

## 2023-10-21 DIAGNOSIS — M12811 Other specific arthropathies, not elsewhere classified, right shoulder: Secondary | ICD-10-CM

## 2023-10-21 DIAGNOSIS — M12812 Other specific arthropathies, not elsewhere classified, left shoulder: Secondary | ICD-10-CM

## 2023-10-21 DIAGNOSIS — M5412 Radiculopathy, cervical region: Secondary | ICD-10-CM

## 2023-10-21 DIAGNOSIS — E119 Type 2 diabetes mellitus without complications: Secondary | ICD-10-CM | POA: Diagnosis not present

## 2023-10-21 DIAGNOSIS — G8929 Other chronic pain: Secondary | ICD-10-CM

## 2023-10-21 MED ORDER — BUPIVACAINE HCL 0.25 % IJ SOLN
2.0000 mL | INTRAMUSCULAR | Status: AC | PRN
  Administered 2023-10-21: 2 mL via INTRA_ARTICULAR

## 2023-10-21 MED ORDER — METHYLPREDNISOLONE ACETATE 40 MG/ML IJ SUSP
40.0000 mg | INTRAMUSCULAR | Status: AC | PRN
  Administered 2023-10-21: 40 mg via INTRA_ARTICULAR

## 2023-10-21 MED ORDER — LIDOCAINE HCL 1 % IJ SOLN
2.0000 mL | INTRAMUSCULAR | Status: AC | PRN
  Administered 2023-10-21: 2 mL

## 2023-10-21 NOTE — Progress Notes (Signed)
 Patient says that his pain returned about 3 weeks ago. He says that the front of the shoulder still feels okay from the more recent injection, but the back of the shoulder and down the arm a bit are much more painful. He is here today for repeat injection.

## 2023-10-21 NOTE — Progress Notes (Signed)
 Luis Larson - 68 y.o. male MRN [id number]  Date of birth: [birth year]  Office Visit Note: Visit Date: 10/21/2023 PCP: Vicci Barnie NOVAK, MD Referred by: Vicci Barnie NOVAK, MD  Subjective: Chief Complaint  Patient presents with   Right Shoulder - Pain   HPI: Luis Larson is a pleasant 68 y.o. male who presents today for acute on chronic right shoulder pain. Also for cervical MRI review.  Right shoulder -he has received good relief from previous injections into the shoulder.  Last performed right subacromial joint injection 3 months ago which gave him good relief until the last 2-3 weeks when his pain started returning.  Has pain more so over the lateral side that radiates into the deltoid, worse with reaching maneuvers. He is not taking any consistent medication for pain.  Neck/arm - did have his cervical MRI, here to review as well. Mendel is not having any pain about the neck, PT continues with numbness and tingling down the lateral/ulnar arm past the elbow and into the 4th and 5th digit.  He does report some diminished grip strength but this has not changed over the past year or so.  He reports chronic numbness and tingling in the 4th and 5th digit for the last few years.  This does not bother him significantly.  He would not want to ascertain any surgical intervention at this point.  He is a type-II diabetic, but well-controlled at this point.  He is managed on semaglutide  2 mg IM weekly, metformin  500 mg XR daily as well as Farxiga  10 mg daily.  Lab Results  Component Value Date   HGBA1C 6.5 09/19/2023   Pertinent ROS were reviewed with the patient and found to be negative unless otherwise specified above in HPI.   Assessment & Plan: Visit Diagnoses:  1. Chronic right shoulder pain   2. Radiculopathy, cervical region   3. DDD (degenerative disc disease), cervical   4. Rotator cuff arthropathy of both shoulders   5. Diabetes mellitus without complication (HCC)    Plan:  Impression is acute on chronic right shoulder pain with rotator cuff arthropathy.  Was doing very well from previous injection until the last 2-3 weeks when pain became reexacerbated.  Through shared decision making, we did proceed with right shoulder subacromial joint injection, patient tolerated well.  Advised on postinjection protocol.  May use ice/heat or over-the-counter anti-inflammatories only as needed.  He will monitor his sugars for transient increase.  He will continue semaglutide , metformin  XR 500 mg daily and his Farxiga  10 mg daily.  Continue routine A1c follow-up per his PCP.  In terms of his right upper extremity radicular symptoms, we did review his cervical MRI which shows moderate to severe foraminal stenosis at multiple levels from the C5-C6-C7 juncture.  It is most likely his symptoms are emanating from neural impingement from the neck, possible he has some functional entrapment at the elbow as well.  We discussed possible treatment for this, he is not interested in any surgical intervention.  Given that his symptoms are stable and he is not having any worsening of motor function or strength, we will hold off and just continue to monitor this for now.  I did advise him that if he starts noticing any diminished strength in the future, he is to let me know, otherwise we will continue to monitor as needed.  Meds & Orders: No orders of the defined types were placed in this encounter.   Orders Placed This Encounter  Procedures  Large Joint Inj     Procedures: Large Joint Inj: R subacromial bursa on 10/21/2023 1:29 PM Indications: pain Details: 22 G 1.5 in needle, posterior approach Medications: 2 mL lidocaine  1 %; 2 mL bupivacaine  0.25 %; 40 mg methylPREDNISolone  acetate 40 MG/ML Outcome: tolerated well, no immediate complications  Subacromial Joint Injection, Right Shoulder After discussion on risks/benefits/indications, informed verbal consent was obtained. A timeout was then  performed. Patient was seated on table in exam room. The patient's shoulder was prepped with betadine and alcohol swabs and utilizing posterior approach a 22G, 1.5 needle was directed anteriorly and laterally into the patient's subacromial space was injected with 2:2:1 mixture of lidocaine :bupivicaine:depomedrol with appreciation of free-flowing of the injectate into the bursal space. Patient tolerated the procedure well without immediate complications.   Procedure, treatment alternatives, risks and benefits explained, specific risks discussed. Consent was given by the patient. Immediately prior to procedure a time out was called to verify the correct patient, procedure, equipment, support staff and site/side marked as required. Patient was prepped and draped in the usual sterile fashion.          Clinical History: No specialty comments available.  He reports that he quit smoking about 3 years ago. His smoking use included cigarettes. He uses smokeless tobacco.  Recent Labs    10/26/22 1019 05/16/23 1423 09/19/23 1519  HGBA1C 9.8* 6.2 6.5    Objective:    Physical Exam  Gen: Well-appearing, in no acute distress; non-toxic CV: Well-perfused. Warm.  Resp: Breathing unlabored on room air; no wheezing. Psych: Fluid speech in conversation; appropriate affect; normal thought process  Ortho Exam - Right shoulder:   Imaging:  *I did independently review and interpret cervical spine MRI.  There is moderate to severe bilateral foraminal stenosis from the C5-C7 juncture.  Posterior disc osteophyte complex at the C5-C6 level.  MR Cervical Spine w/o contrast CLINICAL DATA:  Cervical radiculopathy  EXAM: MRI CERVICAL SPINE WITHOUT CONTRAST  TECHNIQUE: Multiplanar, multisequence MR imaging of the cervical spine was performed. No intravenous contrast was administered.  COMPARISON:  None Available.  FINDINGS: Alignment: Grade 1 retrolisthesis at C5-6.  Vertebrae: No fracture,  evidence of discitis, or bone lesion. Modic type 3 changes at C5-6.  Cord: Normal signal and morphology.  Posterior Fossa, vertebral arteries, paraspinal tissues: Negative.  Disc levels:  C1-2: Unremarkable.  C2-3: Left-greater-than-right facet hypertrophy. There is no spinal canal stenosis. Mild left neural foraminal stenosis.  C3-4: Small disc bulge with right-greater-than-left uncovertebral hypertrophy and mild facet hypertrophy. There is no spinal canal stenosis. Moderate right neural foraminal stenosis.  C4-5: Small disc bulge with mild facet hypertrophy. There is no spinal canal stenosis. No neural foraminal stenosis.  C5-6: Intermediate sized disc osteophyte complex. Moderate spinal canal stenosis. Severe bilateral neural foraminal stenosis.  C6-7: Small disc bulge with bilateral uncovertebral hypertrophy. Mild spinal canal stenosis. Severe left neural foraminal stenosis.  C7-T1: Normal disc space and facet joints. There is no spinal canal stenosis. No neural foraminal stenosis.  IMPRESSION: 1. Moderate spinal canal stenosis and severe bilateral neural foraminal stenosis at C5-6. 2. Mild spinal canal stenosis and severe left neural foraminal stenosis at C6-7. 3. Moderate right C3-4 neural foraminal stenosis.  Electronically Signed   By: Franky Stanford M.D.   On: 10/02/2023 01:40  Past Medical/Family/Surgical/Social History: Medications & Allergies reviewed per EMR, new medications updated. Patient Active Problem List   Diagnosis Date Noted   Acute DM type 2 causing complication (HCC) 06/12/2022   Hyperlipidemia 06/12/2022  Essential hypertension, benign 06/12/2022   Hemoptysis 06/12/2022   Coronary artery disease 06/12/2022   Coronary artery disease involving native coronary artery of native heart without angina pectoris 01/21/2022   Unstable angina (HCC)    Dyspnea on exertion 12/09/2021   Diabetes mellitus without complication (HCC) 12/08/2021    Neuropathy, arm, 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   PAD (peripheral artery disease) (HCC) 02/09/2021   Former smoker 08/26/2020   Influenza vaccine needed 03/13/2020   Insomnia 03/13/2020   Primary osteoarthritis of both shoulders 07/24/2019   Morbid obesity (HCC) 07/24/2019   Chronic pain of both shoulders 03/19/2019   Hyperlipidemia 12/16/2017   Other male erectile dysfunction 10/04/2013   Colon cancer screening 10/04/2013   Diabetes (HCC) 12/22/2012   Essential hypertension 12/22/2012   Essential hypertension, benign 12/22/2012   Type II or unspecified type diabetes mellitus without mention of complication, uncontrolled 10/17/2012   Past Medical History:  Diagnosis Date   Acute DM type 2 causing complication (HCC) 06/12/2022   Chronic pain of both shoulders 03/19/2019   Colon cancer screening 10/04/2013   Coronary artery disease 06/12/2022   Coronary artery disease involving native coronary artery of native heart without angina pectoris 01/21/2022   Diabetes (HCC) 12/22/2012   Diabetes mellitus without complication (HCC)    Dyspnea on exertion 12/09/2021   Essential hypertension 12/22/2012   Essential hypertension, benign 12/22/2012   Former smoker 08/26/2020   Hemoptysis 06/12/2022   Hyperlipidemia 12/16/2017   Influenza vaccine needed 03/13/2020   Insomnia 03/13/2020   Morbid obesity (HCC) 07/24/2019   Neuropathy, arm, 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   Other male erectile dysfunction 10/04/2013   PAD (peripheral artery disease) (HCC) 02/09/2021   Primary osteoarthritis of both shoulders 07/24/2019   Type II or unspecified type diabetes mellitus without mention of complication, uncontrolled 10/17/2012   Unstable angina (HCC)    Family History  Problem Relation Age of Onset   Diabetes Mother    Heart disease Mother    Heart disease Father    Past Surgical History:  Procedure Laterality Date   CORONARY PRESSURE/FFR STUDY N/A 12/17/2021   Procedure: INTRAVASCULAR PRESSURE WIRE/FFR STUDY;  Surgeon:  Verlin Lonni BIRCH, MD;  Location: [facility];  Service: Cardiovascular;  Laterality: N/A;   CORONARY STENT INTERVENTION N/A 12/17/2021   Procedure: CORONARY STENT INTERVENTION;  Surgeon: Verlin Lonni BIRCH, MD;  Location: [facility];  Service: Cardiovascular;  Laterality: N/A;   LEFT HEART CATH AND CORONARY ANGIOGRAPHY N/A 12/17/2021   Procedure: LEFT HEART CATH AND CORONARY ANGIOGRAPHY;  Surgeon: Verlin Lonni BIRCH, MD;  Location: [facility];  Service: Cardiovascular;  Laterality: N/A;   Social History   Occupational History   Not on file  Tobacco Use   Smoking status: Former    Current packs/day: 0.00    Types: Cigarettes    Quit date: 04/2020    Years since quitting: 3.5   Smokeless tobacco: Current  Vaping Use   Vaping status: Some Days  Substance and Sexual Activity   Alcohol use: Never   Drug use: Yes    Types: Marijuana   Sexual activity: Yes

## 2023-10-24 ENCOUNTER — Ambulatory Visit: Admitting: Sports Medicine

## 2023-11-01 ENCOUNTER — Other Ambulatory Visit: Payer: Self-pay

## 2023-11-01 ENCOUNTER — Ambulatory Visit (INDEPENDENT_AMBULATORY_CARE_PROVIDER_SITE_OTHER): Admitting: Sports Medicine

## 2023-11-01 ENCOUNTER — Encounter: Payer: Self-pay | Admitting: Sports Medicine

## 2023-11-01 DIAGNOSIS — M25511 Pain in right shoulder: Secondary | ICD-10-CM | POA: Diagnosis not present

## 2023-11-01 DIAGNOSIS — M19011 Primary osteoarthritis, right shoulder: Secondary | ICD-10-CM | POA: Diagnosis not present

## 2023-11-01 DIAGNOSIS — G8929 Other chronic pain: Secondary | ICD-10-CM

## 2023-11-01 DIAGNOSIS — M66321 Spontaneous rupture of flexor tendons, right upper arm: Secondary | ICD-10-CM | POA: Diagnosis not present

## 2023-11-01 MED ORDER — BETAMETHASONE SOD PHOS & ACET 6 (3-3) MG/ML IJ SUSP
6.0000 mg | INTRAMUSCULAR | Status: AC | PRN
  Administered 2023-11-01: 6 mg via INTRA_ARTICULAR

## 2023-11-01 MED ORDER — BUPIVACAINE HCL 0.25 % IJ SOLN
1.0000 mL | INTRAMUSCULAR | Status: AC | PRN
  Administered 2023-11-01: 1 mL via INTRA_ARTICULAR

## 2023-11-01 MED ORDER — METHYLPREDNISOLONE ACETATE 40 MG/ML IJ SUSP
40.0000 mg | INTRAMUSCULAR | Status: AC | PRN
Start: 2023-11-01 — End: 2023-11-01
  Administered 2023-11-01: 40 mg via INTRA_ARTICULAR

## 2023-11-01 MED ORDER — LIDOCAINE HCL 1 % IJ SOLN
1.0000 mL | INTRAMUSCULAR | Status: AC | PRN
  Administered 2023-11-01: 1 mL

## 2023-11-01 NOTE — Progress Notes (Signed)
 Patient says that the last injection did help the posterior shoulder pain, and he is here for R-GHJ/bicep injection today. He says that he has had return of pain, and feels ready for repeat injection.

## 2023-11-01 NOTE — Progress Notes (Signed)
 Luis Larson - 68 y.o. male MRN [id number]  Date of birth: 07/26/1955  Office Visit Note: Visit Date: 11/01/2023 PCP: Vicci Barnie NOVAK, MD Referred by: Vicci Barnie NOVAK, MD  Subjective: Chief Complaint  Patient presents with   Right Shoulder - Pain   HPI: Luis Larson is a pleasant 68 y.o. male who presents today for chronic right shoulder pain.  Dontai is having ongoing right anterior shoulder pain.  Weeks ago we did perform subacromial joint injection and this helped all of his posterior pain but he still has pain over the anterior shoulder joint near the bicep region.  He would like to move forward with ultrasound-guided anterior shoulder injection today.  He is not taking any medications consistently for his pain. Recently returned from a family trip with fishing, clamming, crabbing.  Pertinent ROS were reviewed with the patient and found to be negative unless otherwise specified above in HPI.   Assessment & Plan: Visit Diagnoses:  1. Chronic right shoulder pain   2. Nontraumatic rupture of right proximal biceps tendon   3. Primary osteoarthritis, right shoulder    Plan: Impression is chronic right shoulder pain which is multifactorial, but most of his pain is emanating from the anterior shoulder joint as he does have MRI confirmed complete tear of the proximal long head of the bicep tendon with anterior cartilage loss and early OA.  Through shared decision making, did proceed with ultrasound-guided anterior glenohumeral joint injection placed in the injectate just superior to the bicipital groove as well.  Patient tolerated well.  Advised on postinjection protocol.  May use over-the-counter anti-inflammatories only as needed.  He has done well in the past with this set of injections, we will continue with non-surgical treatment at this time. F/u in 3 months.  Follow-up: Return in about 3 months (around 01/18/2024) for approximately 3 months for R-shoulder   Meds & Orders: No orders  of the defined types were placed in this encounter.   Orders Placed This Encounter  Procedures   Large Joint Inj: R glenohumeral   US  Guided Needle Placement - No Linked Charges     Procedures: Large Joint Inj: R glenohumeral on 11/01/2023 3:12 PM Indications: pain Details: 22 G 1.5 in needle, ultrasound-guided anterior approach Medications: 1 mL lidocaine  1 %; 1 mL bupivacaine  0.25 %; 40 mg methylPREDNISolone  acetate 40 MG/ML; 6 mg betamethasone  acetate-betamethasone  sodium phosphate 6 (3-3) MG/ML Outcome: tolerated well, no immediate complications  *Procedurally-successful anterior glenohumeral joint injection, right shoulder. Procedure, treatment alternatives, risks and benefits explained, specific risks discussed. Consent was given by the patient. Immediately prior to procedure a time out was called to verify the correct patient, procedure, equipment, support staff and site/side marked as required. Patient was prepped and draped in the usual sterile fashion.          Clinical History: No specialty comments available.  He reports that he quit smoking about 3 years ago. His smoking use included cigarettes. He uses smokeless tobacco.  Recent Labs    05/16/23 1423 09/19/23 1519  HGBA1C 6.2 6.5    Objective:    Physical Exam  Gen: Well-appearing, in no acute distress; non-toxic CV: Well-perfused. Warm.  Resp: Breathing unlabored on room air; no wheezing. Psych: Fluid speech in conversation; appropriate affect; normal thought process  Ortho Exam - Right shoulder: + Positive TTP in the bicipital groove and with deep palpation over the anterior joint recess.  No pain at Codman's point.  Good active range of motion.  Imaging:  Narrative & Impression  CLINICAL DATA:  Chronic right shoulder pain. Decreased range of motion.   EXAM: MRI OF THE RIGHT SHOULDER WITHOUT CONTRAST   TECHNIQUE: Multiplanar, multisequence MR imaging of the shoulder was performed. No intravenous  contrast was administered.   COMPARISON:  None Available.   FINDINGS: Rotator cuff: Mild tendinosis of the supraspinatus tendon with a 12 mm full-thickness tear along the anterior aspect and 9 mm of retraction. Moderate tendinosis of the infraspinatus tendon with a interstitial tear at the musculotendinous junction. Teres minor tendon is intact. Mild tendinosis of the subscapularis tendon with a partial-thickness tear.   Muscles: No muscle atrophy or edema. No intramuscular fluid collection or hematoma.   Biceps Long Head: Complete tear of the proximal long head of the biceps tendon with a stump of tissue attached at the bicipito-labral complex.   Acromioclavicular Joint: Moderate arthropathy of the acromioclavicular joint. No subacromial/subdeltoid bursal fluid.   Glenohumeral Joint: Small joint effusion. Partial-thickness cartilage loss of the glenohumeral joint.   Labrum: Superior labral degeneration. Posterior labral tear with a 2 mm paralabral cyst.   Bones: No fracture or dislocation. No aggressive osseous lesion.   Other: No fluid collection or hematoma.   IMPRESSION: 1. Mild tendinosis of the supraspinatus tendon with a 12 mm full-thickness tear along the anterior aspect and 9 mm of retraction. 2. Moderate tendinosis of the infraspinatus tendon with a interstitial tear at the musculotendinous junction. 3. Mild tendinosis of the subscapularis tendon with a partial-thickness tear. 4. Complete tear of the proximal long head of the biceps tendon with a stump of tissue attached at the bicipito-labral complex.     Electronically Signed   By: Julaine Blanch M.D.   On: 07/07/2022 07:10    Past Medical/Family/Surgical/Social History: Medications & Allergies reviewed per EMR, new medications updated. Patient Active Problem List   Diagnosis Date Noted   Acute DM type 2 causing complication (HCC) 06/12/2022   Hyperlipidemia 06/12/2022   Essential hypertension, benign  06/12/2022   Hemoptysis 06/12/2022   Coronary artery disease 06/12/2022   Coronary artery disease involving native coronary artery of native heart without angina pectoris 01/21/2022   Unstable angina (HCC)    Dyspnea on exertion 12/09/2021   Diabetes mellitus without complication (HCC) 12/08/2021   Neuropathy, arm, 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   PAD (peripheral artery disease) (HCC) 02/09/2021   Former smoker 08/26/2020   Influenza vaccine needed 03/13/2020   Insomnia 03/13/2020   Primary osteoarthritis of both shoulders 07/24/2019   Morbid obesity (HCC) 07/24/2019   Chronic pain of both shoulders 03/19/2019   Hyperlipidemia 12/16/2017   Other male erectile dysfunction 10/04/2013   Colon cancer screening 10/04/2013   Diabetes (HCC) 12/22/2012   Essential hypertension 12/22/2012   Essential hypertension, benign 12/22/2012   Type II or unspecified type diabetes mellitus without mention of complication, uncontrolled 10/17/2012   Past Medical History:  Diagnosis Date   Acute DM type 2 causing complication (HCC) 06/12/2022   Chronic pain of both shoulders 03/19/2019   Colon cancer screening 10/04/2013   Coronary artery disease 06/12/2022   Coronary artery disease involving native coronary artery of native heart without angina pectoris 01/21/2022   Diabetes (HCC) 12/22/2012   Diabetes mellitus without complication (HCC)    Dyspnea on exertion 12/09/2021   Essential hypertension 12/22/2012   Essential hypertension, benign 12/22/2012   Former smoker 08/26/2020   Hemoptysis 06/12/2022   Hyperlipidemia 12/16/2017   Influenza vaccine needed 03/13/2020   Insomnia 03/13/2020   Morbid obesity (  HCC) 07/24/2019   Neuropathy, arm, 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   Other male erectile dysfunction 10/04/2013   PAD (peripheral artery disease) (HCC) 02/09/2021   Primary osteoarthritis of both shoulders 07/24/2019   Type II or unspecified type diabetes mellitus without mention of complication, uncontrolled 10/17/2012    Unstable angina (HCC)    Family History  Problem Relation Age of Onset   Diabetes Mother    Heart disease Mother    Heart disease Father    Past Surgical History:  Procedure Laterality Date   CORONARY PRESSURE/FFR STUDY N/A 12/17/2021   Procedure: INTRAVASCULAR PRESSURE WIRE/FFR STUDY;  Surgeon: Verlin Lonni BIRCH, MD;  Location: [facility];  Service: Cardiovascular;  Laterality: N/A;   CORONARY STENT INTERVENTION N/A 12/17/2021   Procedure: CORONARY STENT INTERVENTION;  Surgeon: Verlin Lonni BIRCH, MD;  Location: [facility];  Service: Cardiovascular;  Laterality: N/A;   LEFT HEART CATH AND CORONARY ANGIOGRAPHY N/A 12/17/2021   Procedure: LEFT HEART CATH AND CORONARY ANGIOGRAPHY;  Surgeon: Verlin Lonni BIRCH, MD;  Location: [facility];  Service: Cardiovascular;  Laterality: N/A;   Social History   Occupational History   Not on file  Tobacco Use   Smoking status: Former    Current packs/day: 0.00    Types: Cigarettes    Quit date: 04/2020    Years since quitting: 3.5   Smokeless tobacco: Current  Vaping Use   Vaping status: Some Days  Substance and Sexual Activity   Alcohol use: Never   Drug use: Yes    Types: Marijuana   Sexual activity: Yes

## 2023-11-20 ENCOUNTER — Other Ambulatory Visit: Payer: Self-pay | Admitting: Internal Medicine

## 2023-11-20 DIAGNOSIS — I152 Hypertension secondary to endocrine disorders: Secondary | ICD-10-CM

## 2023-11-20 DIAGNOSIS — I2510 Atherosclerotic heart disease of native coronary artery without angina pectoris: Secondary | ICD-10-CM

## 2023-11-20 DIAGNOSIS — E1169 Type 2 diabetes mellitus with other specified complication: Secondary | ICD-10-CM

## 2023-11-30 ENCOUNTER — Other Ambulatory Visit: Payer: Self-pay | Admitting: Internal Medicine

## 2023-12-07 ENCOUNTER — Other Ambulatory Visit: Payer: Self-pay | Admitting: Internal Medicine

## 2024-01-17 ENCOUNTER — Ambulatory Visit

## 2024-01-18 ENCOUNTER — Encounter: Payer: Self-pay | Admitting: Sports Medicine

## 2024-01-18 ENCOUNTER — Ambulatory Visit: Admitting: Sports Medicine

## 2024-01-18 DIAGNOSIS — M66321 Spontaneous rupture of flexor tendons, right upper arm: Secondary | ICD-10-CM | POA: Diagnosis not present

## 2024-01-18 DIAGNOSIS — G8929 Other chronic pain: Secondary | ICD-10-CM | POA: Diagnosis not present

## 2024-01-18 DIAGNOSIS — M25511 Pain in right shoulder: Secondary | ICD-10-CM | POA: Diagnosis not present

## 2024-01-18 DIAGNOSIS — M12811 Other specific arthropathies, not elsewhere classified, right shoulder: Secondary | ICD-10-CM

## 2024-01-18 MED ORDER — BUPIVACAINE HCL 0.25 % IJ SOLN
2.0000 mL | INTRAMUSCULAR | Status: AC | PRN
  Administered 2024-01-18: 2 mL via INTRA_ARTICULAR

## 2024-01-18 MED ORDER — METHYLPREDNISOLONE ACETATE 40 MG/ML IJ SUSP
40.0000 mg | INTRAMUSCULAR | Status: AC | PRN
  Administered 2024-01-18: 40 mg via INTRA_ARTICULAR

## 2024-01-18 MED ORDER — LIDOCAINE HCL 1 % IJ SOLN
2.0000 mL | INTRAMUSCULAR | Status: AC | PRN
  Administered 2024-01-18: 2 mL

## 2024-01-18 NOTE — Progress Notes (Signed)
 Patient says that he has been building a lot at home, which has flared up his shoulder pain. He is here today for repeat injection.

## 2024-01-18 NOTE — Progress Notes (Signed)
 Luis Larson - 68 y.o. male MRN [id number]  Date of birth: 04/28/1955  Office Visit Note: Visit Date: 01/18/2024 PCP: Vicci Barnie NOVAK, MD Referred by: Vicci Barnie NOVAK, MD  Subjective: Chief Complaint  Patient presents with   Right Shoulder - Follow-up   HPI: Luis Larson is a pleasant 68 y.o. male who presents today for chronic right shoulder pain.  He does have MRI confirmed notable rotator cuff arthropathy, has done well from subacromial joint injections, the last was back in early July.  At times he has received anterior joint/bicep tendon injections when he feels he needs it.  He recently he has been doing a lot of building and projects at home as well as raising a crockpot overhead which has flared up his shoulder.  Again more with reaching and activation of the rotator cuff.  Not taking any specific medication for this.  He would like to proceed with injection today.  Pertinent ROS were reviewed with the patient and found to be negative unless otherwise specified above in HPI.   Assessment & Plan: Visit Diagnoses:  1. Chronic right shoulder pain   2. Rotator cuff arthropathy of right shoulder   3. Nontraumatic rupture of right proximal biceps tendon    Plan: Impression is chronic right shoulder pain with a degree of arthritis but more so global rotator cuff arthropathy with tendinosis and a prior complete tear of the bicep tendon, his pain has been exacerbated here recently as he has been doing a lot of home projects and building with overhead work.  Given his exacerbation, we did proceed with subacromial joint injection for the right shoulder, patient tolerated well.  Advised on postinjection protocol.  May use ice/heat or Tylenol  as needed for pain relief in the short-term.  Will continue to stay active with his daily activities with his shoulder/arms.  Given he has done well with this regimen, we will follow-up as needed for infrequent injections, home therapy and continue with  nonsurgical treatment at this time.  Can consider anterior shoulder joint or bicep specific injections in future as needed if not receiving full relief with above.  Follow-up: Return in about 2 weeks (around 02/01/2024) for R-shoulder .   Meds & Orders: No orders of the defined types were placed in this encounter.   Orders Placed This Encounter  Procedures   Large Joint Inj     Procedures: Large Joint Inj: R subacromial bursa on 01/18/2024 9:09 AM Indications: pain Details: 22 G 1.5 in needle, posterior approach Medications: 2 mL lidocaine  1 %; 2 mL bupivacaine  0.25 %; 40 mg methylPREDNISolone  acetate 40 MG/ML Outcome: tolerated well, no immediate complications  Subacromial Joint Injection, Right Shoulder After discussion on risks/benefits/indications, informed verbal consent was obtained. A timeout was then performed. Patient was seated on table in exam room. The patient's shoulder was prepped with betadine and alcohol swabs and utilizing posterior approach a 22G, 1.5 needle was directed anteriorly and laterally into the patient's subacromial space was injected with 2:2:1 mixture of lidocaine :bupivicaine:depomedrol with appreciation of free-flowing of the injectate into the bursal space. Patient tolerated the procedure well without immediate complications.   Procedure, treatment alternatives, risks and benefits explained, specific risks discussed. Consent was given by the patient. Immediately prior to procedure a time out was called to verify the correct patient, procedure, equipment, support staff and site/side marked as required. Patient was prepped and draped in the usual sterile fashion.          Clinical History: No  specialty comments available.  He reports that he quit smoking about 3 years ago. His smoking use included cigarettes. He uses smokeless tobacco.  Recent Labs    05/16/23 1423 09/19/23 1519  HGBA1C 6.2 6.5    Objective:    Physical Exam  Gen:  Well-appearing, in no acute distress; non-toxic CV: Well-perfused. Warm.  Resp: Breathing unlabored on room air; no wheezing. Psych: Fluid speech in conversation; appropriate affect; normal thought process  Ortho Exam - Right shoulder: There is a mild degree of swelling over the subacromial bursa region.  No specific joint effusion.  There is pain with activation of the rotator cuff in all planes, positive empty can test/drop arm test.  Imaging:  Narrative & Impression  CLINICAL DATA:  Chronic right shoulder pain. Decreased range of motion.   EXAM: MRI OF THE RIGHT SHOULDER WITHOUT CONTRAST   TECHNIQUE: Multiplanar, multisequence MR imaging of the shoulder was performed. No intravenous contrast was administered.   COMPARISON:  None Available.   FINDINGS: Rotator cuff: Mild tendinosis of the supraspinatus tendon with a 12 mm full-thickness tear along the anterior aspect and 9 mm of retraction. Moderate tendinosis of the infraspinatus tendon with a interstitial tear at the musculotendinous junction. Teres minor tendon is intact. Mild tendinosis of the subscapularis tendon with a partial-thickness tear.   Muscles: No muscle atrophy or edema. No intramuscular fluid collection or hematoma.   Biceps Long Head: Complete tear of the proximal long head of the biceps tendon with a stump of tissue attached at the bicipito-labral complex.   Acromioclavicular Joint: Moderate arthropathy of the acromioclavicular joint. No subacromial/subdeltoid bursal fluid.   Glenohumeral Joint: Small joint effusion. Partial-thickness cartilage loss of the glenohumeral joint.   Labrum: Superior labral degeneration. Posterior labral tear with a 2 mm paralabral cyst.   Bones: No fracture or dislocation. No aggressive osseous lesion.   Other: No fluid collection or hematoma.   IMPRESSION: 1. Mild tendinosis of the supraspinatus tendon with a 12 mm full-thickness tear along the anterior aspect and  9 mm of retraction. 2. Moderate tendinosis of the infraspinatus tendon with a interstitial tear at the musculotendinous junction. 3. Mild tendinosis of the subscapularis tendon with a partial-thickness tear. 4. Complete tear of the proximal long head of the biceps tendon with a stump of tissue attached at the bicipito-labral complex.     Electronically Signed   By: Julaine Blanch M.D.   On: 07/07/2022 07:10    Past Medical/Family/Surgical/Social History: Medications & Allergies reviewed per EMR, new medications updated. 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   PAD (peripheral artery disease) 02/09/2021   Former smoker 08/26/2020   Influenza vaccine needed 03/13/2020   Insomnia 03/13/2020   Primary osteoarthritis of both shoulders 07/24/2019   Morbid obesity (HCC) 07/24/2019   Chronic pain of both shoulders 03/19/2019   Hyperlipidemia 12/16/2017   Other male erectile dysfunction 10/04/2013   Colon cancer screening 10/04/2013   Diabetes (HCC) 12/22/2012   Essential hypertension 12/22/2012   Essential hypertension, benign 12/22/2012   Type II or unspecified type diabetes  mellitus without mention of complication, uncontrolled 10/17/2012   Past Medical History:  Diagnosis Date   Acute DM type 2 causing complication (HCC) 06/12/2022   Chronic pain of both shoulders 03/19/2019   Colon cancer screening 10/04/2013   Coronary artery disease 06/12/2022   Coronary artery disease involving native coronary artery of native heart  without angina pectoris 01/21/2022   Diabetes (HCC) 12/22/2012   Diabetes mellitus without complication (HCC)    Dyspnea on exertion 12/09/2021   Essential hypertension 12/22/2012   Essential hypertension, benign 12/22/2012   Former smoker 08/26/2020   Hemoptysis 06/12/2022   Hyperlipidemia 12/16/2017   Influenza vaccine needed 03/13/2020   Insomnia 03/13/2020   Morbid obesity (HCC) 07/24/2019   Neuropathy, arm, 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   Other male erectile dysfunction 10/04/2013   PAD (peripheral artery disease) 02/09/2021   Primary osteoarthritis of both shoulders 07/24/2019   Type II or unspecified type diabetes mellitus without mention of complication, uncontrolled 10/17/2012   Unstable angina (HCC)    Family History  Problem Relation Age of Onset   Diabetes Mother    Heart disease Mother    Heart disease Father    Past Surgical History:  Procedure Laterality Date   CORONARY PRESSURE/FFR STUDY N/A 12/17/2021   Procedure: INTRAVASCULAR PRESSURE WIRE/FFR STUDY;  Surgeon: Verlin Lonni BIRCH, MD;  Location: [facility];  Service: Cardiovascular;  Laterality: N/A;   CORONARY STENT INTERVENTION N/A 12/17/2021   Procedure: CORONARY STENT INTERVENTION;  Surgeon: Verlin Lonni BIRCH, MD;  Location: [facility];  Service: Cardiovascular;  Laterality: N/A;   LEFT HEART CATH AND CORONARY ANGIOGRAPHY N/A 12/17/2021   Procedure: LEFT HEART CATH AND CORONARY ANGIOGRAPHY;  Surgeon: Verlin Lonni BIRCH, MD;  Location: [facility];  Service: Cardiovascular;  Laterality: N/A;   Social History   Occupational History   Not on file  Tobacco Use   Smoking status: Former    Current packs/day: 0.00    Types: Cigarettes    Quit date: 04/2020    Years since quitting: 3.7   Smokeless tobacco: Current  Vaping Use   Vaping status: Some Days  Substance and Sexual Activity   Alcohol use: Never   Drug use: Yes    Types: Marijuana   Sexual activity: Yes

## 2024-02-01 ENCOUNTER — Other Ambulatory Visit: Payer: Self-pay

## 2024-02-01 ENCOUNTER — Ambulatory Visit (INDEPENDENT_AMBULATORY_CARE_PROVIDER_SITE_OTHER): Admitting: Sports Medicine

## 2024-02-01 ENCOUNTER — Encounter: Payer: Self-pay | Admitting: Sports Medicine

## 2024-02-01 DIAGNOSIS — M19011 Primary osteoarthritis, right shoulder: Secondary | ICD-10-CM | POA: Diagnosis not present

## 2024-02-01 DIAGNOSIS — M25511 Pain in right shoulder: Secondary | ICD-10-CM

## 2024-02-01 DIAGNOSIS — M66321 Spontaneous rupture of flexor tendons, right upper arm: Secondary | ICD-10-CM | POA: Diagnosis not present

## 2024-02-01 DIAGNOSIS — G8929 Other chronic pain: Secondary | ICD-10-CM

## 2024-02-01 NOTE — Progress Notes (Signed)
 Luis Larson - 68 y.o. male MRN [id number]  Date of birth: Jan 16, 1956  Office Visit Note: Visit Date: 02/01/2024 PCP: Vicci Barnie NOVAK, MD Referred by: Vicci Barnie NOVAK, MD  Subjective: Chief Complaint  Patient presents with   Right Shoulder - Follow-up   HPI: Luis Larson is a pleasant 68 y.o. male who presents today for follow-up of right shoulder pain.  Luis Larson is having definite pain over the anterior aspect of the shoulder.  The rotator cuff and posterior shoulder has done well from subacromial joint injection.  He did aggravate the shoulder lifting up a few weeks ago.  Not taking any medication specifically for this.  Pertinent ROS were reviewed with the patient and found to be negative unless otherwise specified above in HPI.   Assessment & Plan: Visit Diagnoses:  1. Chronic right shoulder pain   2. Nontraumatic rupture of right proximal biceps tendon   3. Primary osteoarthritis, right shoulder    Plan: Impression is acute on chronic right anterior shoulder pain which is emanating specifically from his proximal bicep tendon with previous MRI confirmed tearing with residual stump at the bicipito-labral complex.  In months past he did receive good relief from ultrasound-guided injection, through shared decision making we did proceed with this for the proximal bicep tendon sheath today, patient tolerated well.  Advised on postinjection protocol.  After 48 to 72 hours from injection, he may get back to routine therapy as his pain allows.  Continue strengthening and stabilization about the rotator cuff itself. F/u in 3 months.   Follow-up: Return in about 3 months (around 05/03/2024) for R-shoulder .   Meds & Orders: No orders of the defined types were placed in this encounter.   Orders Placed This Encounter  Procedures   US  Guided Needle Placement - No Linked Charges     Procedures: US -Guided Biceps tendon sheath injection, Right shoulder  After discussion on  risks/benefits/indications, informed verbal consent was obtained. A timeout was then performed. Patient was placed in supine position on the table in exam room. The patient's anterior shoulder was prepped with betadine and alcohol swabs. Utilizing ultrasound guidance, the biceps tendon sheath was identified in a short-axis view. Using a 25G, 1.5 needle under ultrasound guidance, the tendon sheath was injected from a lateral-medial direction with a 2:2:1 lidocaine :bupivicaine:depomedrol with visualization of injectate flow within the tendon sheath via an in-plane technique. Patient tolerated the procedure well without immediate complications.        Clinical History: No specialty comments available.  He reports that he quit smoking about 3 years ago. His smoking use included cigarettes. He uses smokeless tobacco.  Recent Labs    05/16/23 1423 09/19/23 1519  HGBA1C 6.2 6.5    Objective:    Physical Exam  Gen: Well-appearing, in no acute distress; non-toxic CV: Well-perfused. Warm.  Resp: Breathing unlabored on room air; no wheezing. Psych: Fluid speech in conversation; appropriate affect; normal thought process  Ortho Exam - Right shoulder: No redness swelling or effusion. + TTP within the proximal bicipital groove.  Imaging: Narrative & Impression  CLINICAL DATA:  Chronic right shoulder pain. Decreased range of motion.   EXAM: MRI OF THE RIGHT SHOULDER WITHOUT CONTRAST   TECHNIQUE: Multiplanar, multisequence MR imaging of the shoulder was performed. No intravenous contrast was administered.   COMPARISON:  None Available.   FINDINGS: Rotator cuff: Mild tendinosis of the supraspinatus tendon with a 12 mm full-thickness tear along the anterior aspect and 9 mm of retraction. Moderate tendinosis  of the infraspinatus tendon with a interstitial tear at the musculotendinous junction. Teres minor tendon is intact. Mild tendinosis of the subscapularis tendon with  a partial-thickness tear.   Muscles: No muscle atrophy or edema. No intramuscular fluid collection or hematoma.   Biceps Long Head: Complete tear of the proximal long head of the biceps tendon with a stump of tissue attached at the bicipito-labral complex.   Acromioclavicular Joint: Moderate arthropathy of the acromioclavicular joint. No subacromial/subdeltoid bursal fluid.   Glenohumeral Joint: Small joint effusion. Partial-thickness cartilage loss of the glenohumeral joint.   Labrum: Superior labral degeneration. Posterior labral tear with a 2 mm paralabral cyst.   Bones: No fracture or dislocation. No aggressive osseous lesion.   Other: No fluid collection or hematoma.   IMPRESSION: 1. Mild tendinosis of the supraspinatus tendon with a 12 mm full-thickness tear along the anterior aspect and 9 mm of retraction. 2. Moderate tendinosis of the infraspinatus tendon with a interstitial tear at the musculotendinous junction. 3. Mild tendinosis of the subscapularis tendon with a partial-thickness tear. 4. Complete tear of the proximal long head of the biceps tendon with a stump of tissue attached at the bicipito-labral complex.     Electronically Signed   By: Julaine Blanch M.D.   On: 07/07/2022 07:10    Past Medical/Family/Surgical/Social History: Medications & Allergies reviewed per EMR, new medications updated. 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   PAD (peripheral artery disease) 02/09/2021   Former smoker  08/26/2020   Influenza vaccine needed 03/13/2020   Insomnia 03/13/2020   Primary osteoarthritis of both shoulders 07/24/2019   Morbid obesity (HCC) 07/24/2019   Chronic pain of both shoulders 03/19/2019   Hyperlipidemia 12/16/2017   Other male erectile dysfunction 10/04/2013   Colon cancer screening 10/04/2013   Diabetes (HCC) 12/22/2012   Essential hypertension 12/22/2012   Essential hypertension, benign 12/22/2012   Type II or unspecified type diabetes mellitus without mention of complication, uncontrolled 10/17/2012   Past Medical History:  Diagnosis Date   Acute DM type 2 causing complication (HCC) 06/12/2022   Chronic pain of both shoulders 03/19/2019   Colon cancer screening 10/04/2013   Coronary artery disease 06/12/2022   Coronary artery disease involving native coronary artery of native heart without angina pectoris 01/21/2022   Diabetes (HCC) 12/22/2012   Diabetes mellitus without complication (HCC)    Dyspnea on exertion 12/09/2021   Essential hypertension 12/22/2012   Essential hypertension, benign 12/22/2012   Former smoker 08/26/2020   Hemoptysis 06/12/2022   Hyperlipidemia 12/16/2017   Influenza vaccine needed 03/13/2020   Insomnia 03/13/2020   Morbid obesity (HCC) 07/24/2019   Neuropathy, arm, 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   Other male erectile dysfunction 10/04/2013   PAD (peripheral artery disease) 02/09/2021   Primary osteoarthritis of both shoulders 07/24/2019   Type II or unspecified type diabetes mellitus without mention of complication, uncontrolled 10/17/2012   Unstable angina (HCC)    Family History  Problem Relation Age of Onset   Diabetes Mother    Heart disease Mother    Heart disease Father  Past Surgical History:  Procedure Laterality Date   CORONARY PRESSURE/FFR STUDY N/A 12/17/2021   Procedure: INTRAVASCULAR PRESSURE WIRE/FFR STUDY;  Surgeon: Verlin Lonni BIRCH, MD;  Location: [facility];  Service: Cardiovascular;  Laterality: N/A;    CORONARY STENT INTERVENTION N/A 12/17/2021   Procedure: CORONARY STENT INTERVENTION;  Surgeon: Verlin Lonni BIRCH, MD;  Location: [facility];  Service: Cardiovascular;  Laterality: N/A;   LEFT HEART CATH AND CORONARY ANGIOGRAPHY N/A 12/17/2021   Procedure: LEFT HEART CATH AND CORONARY ANGIOGRAPHY;  Surgeon: Verlin Lonni BIRCH, MD;  Location: [facility];  Service: Cardiovascular;  Laterality: N/A;   Social History   Occupational History   Not on file  Tobacco Use   Smoking status: Former    Current packs/day: 0.00    Types: Cigarettes    Quit date: 04/2020    Years since quitting: 3.8   Smokeless tobacco: Current  Vaping Use   Vaping status: Some Days  Substance and Sexual Activity   Alcohol use: Never   Drug use: Yes    Types: Marijuana   Sexual activity: Yes

## 2024-02-09 ENCOUNTER — Other Ambulatory Visit: Payer: Self-pay | Admitting: Internal Medicine

## 2024-02-09 DIAGNOSIS — E1169 Type 2 diabetes mellitus with other specified complication: Secondary | ICD-10-CM

## 2024-02-13 ENCOUNTER — Encounter: Payer: Self-pay | Admitting: Radiology

## 2024-03-12 ENCOUNTER — Other Ambulatory Visit: Payer: Self-pay | Admitting: Internal Medicine

## 2024-03-12 DIAGNOSIS — E1169 Type 2 diabetes mellitus with other specified complication: Secondary | ICD-10-CM

## 2024-04-07 ENCOUNTER — Other Ambulatory Visit: Payer: Self-pay | Admitting: Internal Medicine

## 2024-04-07 DIAGNOSIS — I152 Hypertension secondary to endocrine disorders: Secondary | ICD-10-CM

## 2024-04-13 ENCOUNTER — Ambulatory Visit: Admitting: Sports Medicine

## 2024-04-13 ENCOUNTER — Encounter: Payer: Self-pay | Admitting: Sports Medicine

## 2024-04-13 DIAGNOSIS — M19011 Primary osteoarthritis, right shoulder: Secondary | ICD-10-CM | POA: Diagnosis not present

## 2024-04-13 DIAGNOSIS — M25511 Pain in right shoulder: Secondary | ICD-10-CM

## 2024-04-13 DIAGNOSIS — M12811 Other specific arthropathies, not elsewhere classified, right shoulder: Secondary | ICD-10-CM

## 2024-04-13 DIAGNOSIS — G8929 Other chronic pain: Secondary | ICD-10-CM | POA: Diagnosis not present

## 2024-04-13 MED ORDER — LIDOCAINE HCL 1 % IJ SOLN
2.0000 mL | INTRAMUSCULAR | Status: AC | PRN
  Administered 2024-04-13: 2 mL

## 2024-04-13 MED ORDER — BUPIVACAINE HCL 0.25 % IJ SOLN
2.0000 mL | INTRAMUSCULAR | Status: AC | PRN
  Administered 2024-04-13: 2 mL via INTRA_ARTICULAR

## 2024-04-13 MED ORDER — METHYLPREDNISOLONE ACETATE 40 MG/ML IJ SUSP
40.0000 mg | INTRAMUSCULAR | Status: AC | PRN
  Administered 2024-04-13: 40 mg via INTRA_ARTICULAR

## 2024-04-13 NOTE — Progress Notes (Signed)
 "  Luis Larson - 69 y.o. male MRN [id number]  Date of birth: 02/06/1956  Office Visit Note: Visit Date: 04/13/2024 PCP: Vicci Barnie NOVAK, MD Referred by: Vicci Barnie NOVAK, MD  Subjective: Chief Complaint  Patient presents with   Right Shoulder - Follow-up   HPI: Luis Larson is a pleasant 69 y.o. male who presents today for acute on chronic right shoulder pain.  [name] has known and MRI confirmed notable rotator cuff tear arthropathy but has done well with months relief from previous subacromial joint injections.  At times he has received bicipital sheath injections given his proximal bicep rupture/stump.  He has been very active with cooking and home projects here over the holidays.  Having pain with reaching up and out with the arm.  He would like to proceed with a subacromial joint injection today.  He is not taking medication for this.   Pertinent ROS were reviewed with the patient and found to be negative unless otherwise specified above in HPI.   Assessment & Plan: Visit Diagnoses:  1. Chronic right shoulder pain   2. Rotator cuff arthropathy of right shoulder   3. Primary osteoarthritis, right shoulder    Plan: Impression is chronic right shoulder pain with a degree of arthritic change but more so global rotator cuff tear arthropathy.  He has no interest in surgical intervention at this time and has done well with conservative treatment as well as infrequent injections.  Through shared decision making, we did proceed with subacromial joint injection, patient tolerated well.  May use ice/heat and/or Tylenol  or over-the-counter anti-inflammatories as needed.  Will continue remaining active, HEP as indicated.  Will see him back in a few weeks to see how the shoulder is doing and see if needed for shoulder joint or bicipital groove injection. Otherwise, f/u PRN.  Meds & Orders: No orders of the defined types were placed in this encounter.   Orders Placed This Encounter  Procedures    Large Joint Inj     Procedures: Large Joint Inj: R subacromial bursa on 04/13/2024 1:03 PM Indications: pain Details: 22 G 1.5 in needle, posterior approach Medications: 2 mL lidocaine  1 %; 2 mL bupivacaine  0.25 %; 40 mg methylPREDNISolone  acetate 40 MG/ML Outcome: tolerated well, no immediate complications  Subacromial Joint Injection, Right Shoulder After discussion on risks/benefits/indications, informed verbal consent was obtained. A timeout was then performed. Patient was seated on table in exam room. The patient's shoulder was prepped with betadine and alcohol swabs and utilizing posterior approach a 22G, 1.5 needle was directed anteriorly and laterally into the patient's subacromial space was injected with 2:2:1 mixture of lidocaine :bupivicaine:depomedrol with appreciation of free-flowing of the injectate into the bursal space. Patient tolerated the procedure well without immediate complications.   Procedure, treatment alternatives, risks and benefits explained, specific risks discussed. Consent was given by the patient. Immediately prior to procedure a time out was called to verify the correct patient, procedure, equipment, support staff and site/side marked as required. Patient was prepped and draped in the usual sterile fashion.          Clinical History: No specialty comments available.  He reports that he quit smoking about 4 years ago. His smoking use included cigarettes. He smoked an average of 2 packs per day. He uses smokeless tobacco.  Recent Labs    05/16/23 1423 09/19/23 1519  HGBA1C 6.2 6.5    Objective:    Physical Exam  Gen: Well-appearing, in no acute distress; non-toxic CV: Well-perfused. Warm.  Resp: Breathing unlabored on room air; no wheezing. Psych: Fluid speech in conversation; appropriate affect; normal thought process  Ortho Exam - Right shoulder: + TTP around Codman's point.  There is no significant effusion.  There is pain with reaching overhead  in flexion and endrange abduction.  There is relatively well-preserved strength with resisted abduction and ER/IR.  Imaging: No results found.  Past Medical/Family/Surgical/Social History: Medications & Allergies reviewed per EMR, new medications updated. 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   PAD (peripheral artery disease) 02/09/2021   Former smoker 08/26/2020   Influenza vaccine needed 03/13/2020   Insomnia 03/13/2020   Primary osteoarthritis of both shoulders 07/24/2019   Morbid obesity (HCC) 07/24/2019   Chronic pain of both shoulders 03/19/2019   Hyperlipidemia 12/16/2017   Other male erectile dysfunction 10/04/2013   Colon cancer screening 10/04/2013   Diabetes (HCC) 12/22/2012   Essential hypertension 12/22/2012   Essential hypertension, benign 12/22/2012   Type II or unspecified type diabetes mellitus without mention of complication, uncontrolled 10/17/2012   Past Medical History:  Diagnosis Date   Acute DM type 2 causing complication (HCC) 06/12/2022   Chronic pain of both shoulders 03/19/2019   Colon cancer screening 10/04/2013   Coronary artery disease 06/12/2022   Coronary artery disease involving native coronary artery of native heart without angina pectoris 01/21/2022   Diabetes (HCC) 12/22/2012   Diabetes mellitus without complication (HCC)    Dyspnea on exertion 12/09/2021   Essential hypertension 12/22/2012   Essential hypertension, benign 12/22/2012   Former smoker 08/26/2020   Hemoptysis  06/12/2022   Hyperlipidemia 12/16/2017   Influenza vaccine needed 03/13/2020   Insomnia 03/13/2020   Morbid obesity (HCC) 07/24/2019   Neuropathy, arm, 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   Other male erectile dysfunction 10/04/2013   PAD (peripheral artery disease) 02/09/2021   Primary osteoarthritis of both shoulders 07/24/2019   Type II or unspecified type diabetes mellitus without mention of complication, uncontrolled 10/17/2012   Unstable angina (HCC)    Family History  Problem Relation Age of Onset   Diabetes Mother    Heart disease Mother    Heart disease Father    Past Surgical History:  Procedure Laterality Date   CORONARY PRESSURE/FFR STUDY N/A 12/17/2021   Procedure: INTRAVASCULAR PRESSURE WIRE/FFR STUDY;  Surgeon: Verlin Lonni BIRCH, MD;  Location: [facility];  Service: Cardiovascular;  Laterality: N/A;   CORONARY STENT INTERVENTION N/A 12/17/2021   Procedure: CORONARY STENT INTERVENTION;  Surgeon: Verlin Lonni BIRCH, MD;  Location: [facility];  Service: Cardiovascular;  Laterality: N/A;   LEFT HEART CATH AND CORONARY ANGIOGRAPHY N/A 12/17/2021   Procedure: LEFT HEART CATH AND CORONARY ANGIOGRAPHY;  Surgeon: Verlin Lonni BIRCH, MD;  Location: [facility];  Service: Cardiovascular;  Laterality: N/A;   Social History   Occupational History   Not on file  Tobacco Use   Smoking status: Former    Current packs/day: 0.00    Average packs/day: 2.0 packs/day    Types: Cigarettes    Quit date: 04/2020    Years since quitting:  4.0   Smokeless tobacco: Current  Vaping Use   Vaping status: Some Days  Substance and Sexual Activity   Alcohol use: Never   Drug use: Yes    Types: Marijuana   Sexual activity: Yes   "

## 2024-04-13 NOTE — Progress Notes (Signed)
 Patient says that his shoulder has been hurting again for about a month. It does hurt more this time than it has previously, although he says he did a lot over the holidays.

## 2024-04-23 ENCOUNTER — Other Ambulatory Visit: Payer: Self-pay | Admitting: Internal Medicine

## 2024-04-23 DIAGNOSIS — E1169 Type 2 diabetes mellitus with other specified complication: Secondary | ICD-10-CM

## 2024-04-24 ENCOUNTER — Ambulatory Visit: Attending: Internal Medicine

## 2024-04-25 ENCOUNTER — Other Ambulatory Visit: Payer: Self-pay | Admitting: Internal Medicine

## 2024-04-25 DIAGNOSIS — E1169 Type 2 diabetes mellitus with other specified complication: Secondary | ICD-10-CM

## 2024-04-27 ENCOUNTER — Encounter: Payer: Self-pay | Admitting: Sports Medicine

## 2024-04-27 ENCOUNTER — Ambulatory Visit: Admitting: Sports Medicine

## 2024-04-27 ENCOUNTER — Other Ambulatory Visit: Payer: Self-pay

## 2024-04-27 DIAGNOSIS — G8929 Other chronic pain: Secondary | ICD-10-CM | POA: Diagnosis not present

## 2024-04-27 DIAGNOSIS — M25511 Pain in right shoulder: Secondary | ICD-10-CM

## 2024-04-27 DIAGNOSIS — M19011 Primary osteoarthritis, right shoulder: Secondary | ICD-10-CM | POA: Diagnosis not present

## 2024-04-27 DIAGNOSIS — M66321 Spontaneous rupture of flexor tendons, right upper arm: Secondary | ICD-10-CM | POA: Diagnosis not present

## 2024-04-27 NOTE — Progress Notes (Signed)
 "  Luis Larson - 69 y.o. male MRN [id number]  Date of birth: 1955-05-25  Office Visit Note: Visit Date: 04/27/2024 PCP: Vicci Barnie NOVAK, MD Referred by: Vicci Barnie NOVAK, MD  Subjective: Chief Complaint  Patient presents with   Right Shoulder - Follow-up   HPI: Luis Larson is a pleasant 69 y.o. male who presents today for f/u of chronic right shoulder pain.  Luis Larson has had pain in the shoulder for years which has been managed by infrequent injections, HEP and OTC medications as needed.  Has confirmed notable rotator cuff tear arthropathy, shoulder OA, as well as proximal bicep tendinopathy and tearing.  Having pain more so in the anterior aspect of the shoulder and bicep region.  Lateral shoulder/RC pain has improved after previous SAJ injection.  He is a type-II diabetic, but well-controlled. On metformin  XR 500mg  every day.  Lab Results  Component Value Date   HGBA1C 6.5 09/19/2023    Pertinent ROS were reviewed with the patient and found to be negative unless otherwise specified above in HPI.   Assessment & Plan: Visit Diagnoses:  1. Chronic right shoulder pain   2. Nontraumatic rupture of right proximal biceps tendon   3. Primary osteoarthritis, right shoulder    Plan: Impression is chronic right shoulder pain which is multifactorial with previous MRI confirmed rotator cuff tear arthropathy, arthritic change and residual stump at the bicipital labral complex.  More of his pain is emanating from this anterior shoulder and proximal bicep/labral complex.  Through shared decision making, we did proceed with ultrasound-guided injection today, patient tolerated well.  May use ice/heat and/or medication for any postinjection pain.  After 2-3 days from injection, he may get back to routine activity and therapy for the shoulder as his pain allows to keep the shoulder and rotator cuff stable.  She will follow-up in 3 months for follow-up. May call or return sooner if any issues  arise.  Follow-up: Return in about 3 months (around 07/26/2024) for R-shoulder.   Meds & Orders: No orders of the defined types were placed in this encounter.   Orders Placed This Encounter  Procedures   US  Guided Needle Placement - No Linked Charges     Procedures: US -Guided Biceps tendon sheath injection, Right shoulder  After discussion on risks/benefits/indications, informed verbal consent was obtained. A timeout was then performed. Patient was placed in supine position on the table in exam room. The patient's anterior shoulder was prepped with betadine and alcohol swabs. Utilizing ultrasound guidance, the biceps tendon sheath was identified in a short-axis view. Using a 22G, 1.5 needle under ultrasound guidance, the tendon sheath was injected from a lateral-medial direction with a 2:2:1 lidocaine :bupivicaine:depomedrol with visualization of injectate flow within the tendon sheath via an in-plane technique. Patient tolerated the procedure well without immediate complications.       Clinical History: No specialty comments available.  He reports that he quit smoking about 4 years ago. His smoking use included cigarettes. He smoked an average of 2 packs per day. He uses smokeless tobacco.  Recent Labs    05/16/23 1423 09/19/23 1519  HGBA1C 6.2 6.5    Objective:    Physical Exam  Gen: Well-appearing, in no acute distress; non-toxic CV: Well-perfused. Warm.  Resp: Breathing unlabored on room air; no wheezing. Psych: Fluid speech in conversation; appropriate affect; normal thought process  Ortho Exam - Right shoulder: + TTP bicipital groove and anterior joint recess.  No redness swelling or effusion about the shoulder.  Imaging:  *  Previous MRI reviewed today.  Narrative & Impression  CLINICAL DATA:  Chronic right shoulder pain. Decreased range of motion.   EXAM: MRI OF THE RIGHT SHOULDER WITHOUT CONTRAST   TECHNIQUE: Multiplanar, multisequence MR imaging of the shoulder  was performed. No intravenous contrast was administered.   COMPARISON:  None Available.   FINDINGS: Rotator cuff: Mild tendinosis of the supraspinatus tendon with a 12 mm full-thickness tear along the anterior aspect and 9 mm of retraction. Moderate tendinosis of the infraspinatus tendon with a interstitial tear at the musculotendinous junction. Teres minor tendon is intact. Mild tendinosis of the subscapularis tendon with a partial-thickness tear.   Muscles: No muscle atrophy or edema. No intramuscular fluid collection or hematoma.   Biceps Long Head: Complete tear of the proximal long head of the biceps tendon with a stump of tissue attached at the bicipito-labral complex.   Acromioclavicular Joint: Moderate arthropathy of the acromioclavicular joint. No subacromial/subdeltoid bursal fluid.   Glenohumeral Joint: Small joint effusion. Partial-thickness cartilage loss of the glenohumeral joint.   Labrum: Superior labral degeneration. Posterior labral tear with a 2 mm paralabral cyst.   Bones: No fracture or dislocation. No aggressive osseous lesion.   Other: No fluid collection or hematoma.   IMPRESSION: 1. Mild tendinosis of the supraspinatus tendon with a 12 mm full-thickness tear along the anterior aspect and 9 mm of retraction. 2. Moderate tendinosis of the infraspinatus tendon with a interstitial tear at the musculotendinous junction. 3. Mild tendinosis of the subscapularis tendon with a partial-thickness tear. 4. Complete tear of the proximal long head of the biceps tendon with a stump of tissue attached at the bicipito-labral complex.     Electronically Signed   By: Julaine Blanch M.D.   On: 07/07/2022 07:10    Past Medical/Family/Surgical/Social History: Medications & Allergies reviewed per EMR, new medications updated. Patient Active Problem List   Diagnosis Date Noted   Acute DM type 2 causing complication (HCC) 06/12/2022   Hyperlipidemia 06/12/2022    Essential hypertension, benign 06/12/2022   Hemoptysis 06/12/2022   Coronary artery disease 06/12/2022   Coronary artery disease involving native coronary artery of native heart without angina pectoris 01/21/2022   Unstable angina (HCC)    Dyspnea on exertion 12/09/2021   Diabetes mellitus without complication (HCC) 12/08/2021   Neuropathy, arm, 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   PAD (peripheral artery disease) 02/09/2021   Former smoker 08/26/2020   Influenza vaccine needed 03/13/2020   Insomnia 03/13/2020   Primary osteoarthritis of both shoulders 07/24/2019   Morbid obesity (HCC) 07/24/2019   Chronic pain of both shoulders 03/19/2019   Hyperlipidemia 12/16/2017   Other male erectile dysfunction 10/04/2013   Colon cancer screening 10/04/2013   Diabetes (HCC) 12/22/2012   Essential hypertension 12/22/2012   Essential hypertension, benign 12/22/2012   Type II or unspecified type diabetes mellitus without mention of complication, uncontrolled 10/17/2012   Past Medical History:  Diagnosis Date   Acute DM type 2 causing complication (HCC) 06/12/2022   Chronic pain of both shoulders 03/19/2019   Colon cancer screening 10/04/2013   Coronary artery disease 06/12/2022   Coronary artery disease involving native coronary artery of native heart without angina pectoris 01/21/2022   Diabetes (HCC) 12/22/2012   Diabetes mellitus without complication (HCC)    Dyspnea on exertion 12/09/2021   Essential hypertension 12/22/2012   Essential hypertension, benign 12/22/2012   Former smoker 08/26/2020   Hemoptysis 06/12/2022   Hyperlipidemia 12/16/2017   Influenza vaccine needed 03/13/2020   Insomnia 03/13/2020  Morbid obesity (HCC) 07/24/2019   Neuropathy, arm, 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   Other male erectile dysfunction 10/04/2013   PAD (peripheral artery disease) 02/09/2021   Primary osteoarthritis of both shoulders 07/24/2019   Type II or unspecified type diabetes mellitus without mention of complication,  uncontrolled 10/17/2012   Unstable angina (HCC)    Family History  Problem Relation Age of Onset   Diabetes Mother    Heart disease Mother    Heart disease Father    Past Surgical History:  Procedure Laterality Date   CORONARY PRESSURE/FFR STUDY N/A 12/17/2021   Procedure: INTRAVASCULAR PRESSURE WIRE/FFR STUDY;  Surgeon: Verlin Lonni BIRCH, MD;  Location: [facility];  Service: Cardiovascular;  Laterality: N/A;   CORONARY STENT INTERVENTION N/A 12/17/2021   Procedure: CORONARY STENT INTERVENTION;  Surgeon: Verlin Lonni BIRCH, MD;  Location: [facility];  Service: Cardiovascular;  Laterality: N/A;   LEFT HEART CATH AND CORONARY ANGIOGRAPHY N/A 12/17/2021   Procedure: LEFT HEART CATH AND CORONARY ANGIOGRAPHY;  Surgeon: Verlin Lonni BIRCH, MD;  Location: [facility];  Service: Cardiovascular;  Laterality: N/A;   Social History   Occupational History   Not on file  Tobacco Use   Smoking status: Former    Current packs/day: 0.00    Average packs/day: 2.0 packs/day    Types: Cigarettes    Quit date: 04/2020    Years since quitting: 4.0   Smokeless tobacco: Current  Vaping Use   Vaping status: Some Days  Substance and Sexual Activity   Alcohol use: Never   Drug use: Yes    Types: Marijuana   Sexual activity: Yes   "

## 2024-05-10 ENCOUNTER — Encounter: Payer: Self-pay | Admitting: Internal Medicine

## 2024-05-10 ENCOUNTER — Ambulatory Visit: Attending: Internal Medicine | Admitting: Internal Medicine

## 2024-05-10 ENCOUNTER — Other Ambulatory Visit: Payer: Self-pay

## 2024-05-10 DIAGNOSIS — Z7985 Long-term (current) use of injectable non-insulin antidiabetic drugs: Secondary | ICD-10-CM

## 2024-05-10 DIAGNOSIS — I10 Essential (primary) hypertension: Secondary | ICD-10-CM

## 2024-05-10 DIAGNOSIS — E1159 Type 2 diabetes mellitus with other circulatory complications: Secondary | ICD-10-CM | POA: Diagnosis not present

## 2024-05-10 DIAGNOSIS — I2510 Atherosclerotic heart disease of native coronary artery without angina pectoris: Secondary | ICD-10-CM

## 2024-05-10 DIAGNOSIS — Z7984 Long term (current) use of oral hypoglycemic drugs: Secondary | ICD-10-CM

## 2024-05-10 DIAGNOSIS — Z6838 Body mass index (BMI) 38.0-38.9, adult: Secondary | ICD-10-CM

## 2024-05-10 DIAGNOSIS — E1169 Type 2 diabetes mellitus with other specified complication: Secondary | ICD-10-CM

## 2024-05-10 DIAGNOSIS — I152 Hypertension secondary to endocrine disorders: Secondary | ICD-10-CM

## 2024-05-10 LAB — GLUCOSE, POCT (MANUAL RESULT ENTRY): POC Glucose: 125 mg/dL — AB (ref 70–99)

## 2024-05-10 LAB — POCT GLYCOSYLATED HEMOGLOBIN (HGB A1C): HbA1c, POC (controlled diabetic range): 6.8 % (ref 0.0–7.0)

## 2024-05-10 MED ORDER — HYDROCHLOROTHIAZIDE 25 MG PO TABS: 25.0000 mg | ORAL_TABLET | Freq: Every day | ORAL | 1 refills | Status: AC

## 2024-05-10 MED ORDER — LISINOPRIL 30 MG PO TABS: 30.0000 mg | ORAL_TABLET | Freq: Every day | ORAL | 3 refills | Status: AC

## 2024-05-10 MED ORDER — AMLODIPINE BESYLATE 10 MG PO TABS: 10.0000 mg | ORAL_TABLET | Freq: Every day | ORAL | 3 refills | Status: AC

## 2024-05-10 MED ORDER — TIRZEPATIDE 5 MG/0.5ML ~~LOC~~ SOAJ
5.0000 mg | SUBCUTANEOUS | 0 refills | Status: AC
  Filled 2024-05-10: qty 2, 28d supply, fill #0

## 2024-05-10 NOTE — Patient Instructions (Signed)
" °  VISIT SUMMARY: Today, you had a follow-up appointment to discuss your diabetes management and overall health. Your A1c has slightly increased to 6.8 but remains below the target of 7. Your blood sugar today was 125. We discussed your weight management and medication adjustments. You also reported good blood pressure control and no recent cardiac symptoms. We reviewed your current medications and made some changes to better support your health goals.  YOUR PLAN: -TYPE 2 DIABETES MELLITUS WITH MORBID OBESITY: Type 2 diabetes is a condition where your body does not use insulin  properly, leading to high blood sugar levels. We noted that your A1c has increased slightly to 6.8 but is still below the target of 7. To help with weight loss, we have discontinued Ozempic  and started you on Mounjaro  5 mg weekly, which we will adjust based on your tolerance. Please send a MyChart message when you are tolerating the current dose so we can increase it if needed. Continue taking Farxiga  and Metformin  as prescribed. Keep up with your dietary management and exercise routine.  -HYPERTENSION ASSOCIATED WITH TYPE 2 DIABETES MELLITUS: Hypertension, or high blood pressure, is well-controlled with your current medications. Continue taking your antihypertensive medications as prescribed.  -CORONARY ARTERY DISEASE: Coronary artery disease is a condition where the blood vessels supplying your heart are narrowed or blocked. You have not had any recent cardiac symptoms, and the recent chest discomfort was not related to your heart.  -HYPERLIPIDEMIA: Hyperlipidemia is having high levels of fats (lipids) in your blood, which can increase your risk of heart disease. Continue taking Rosuvastatin  40 mg daily to manage your cholesterol levels.  -GENERAL HEALTH MAINTENANCE: You are due for routine blood work to check your cholesterol, kidney, and liver function. These tests are important to monitor your overall health and the  effectiveness of your medications.  INSTRUCTIONS: Please send a MyChart message when you are tolerating the current dose of Mounjaro  so we can adjust it if needed. Continue taking your current medications as prescribed. Make sure to complete the routine blood work for cholesterol, kidney, and liver function tests as ordered. Keep up with your dietary management and exercise routine. Follow up with us  as needed for any concerns or questions.    Contains text generated by [employer].   "

## 2024-05-10 NOTE — Progress Notes (Signed)
 "   Patient ID: Luis Larson, male    DOB: [birth year]  MRN: [id number]  CC: Diabetes (DM f/u./Requesting Ozempic  dosage increase/No to flu vax)   Subjective: Luis Larson is a 69 y.o. male who presents for chronic ds management. His chronic medical issues include:  Pt with hx of HTN, DM, HL, former tob dep (quit 04/2020), ED, underdeveloped pectoralis muscle right side, OA knees and shoulders, emphysematous changes on CT    Discussed the use of AI scribe software for clinical note transcription with the patient, who gave verbal consent to proceed.  History of Present Illness [name] is a 69 year old male with diabetes who presents for follow-up on diabetes management.  DM: Results for orders placed or performed in visit on 05/10/24  POCT glucose (manual entry)   Collection Time: 05/10/24  4:33 PM  Result Value Ref Range   POC Glucose 125 (A) 70 - 99 mg/dl  POCT glycosylated hemoglobin (Hb A1C)   Collection Time: 05/10/24  4:56 PM  Result Value Ref Range   Hemoglobin A1C     HbA1c POC (<> result, manual entry)     HbA1c, POC (prediabetic range)     HbA1c, POC (controlled diabetic range) 6.8 0.0 - 7.0 %  His current A1c is 6.8, slightly increased from 6.5 six months ago, but remains below the target of 7. His blood sugar today is 125.  According to his CGM, over the past 7 days his blood sugars have been within target range 99% of the times and high 1% of the times.  Over the past 2 weeks he has been in target range 96% of the times and high 3% of the times, low 1%.  He is on Ozempic  2 mg weekly, Farxiga  10 mg daily, and metformin  500 mg daily. He feels 'stuck' with his weight, fluctuating between 265 and 272 pounds, and is interested in adjusting his medication to aid further weight loss.  Down additional 8 pounds according to our scale from his last visit in June.  He follows a diet with lots of vegetables and protein, avoiding fast food and potatoes, and sometimes feels 'funny' if he  delays eating in the morning. In terms of physical activity, he walks and goes thrifting, though he has been less active due to weather conditions. He has access to a gym membership through [employer] but has not been utilizing it recently due to the weather. He has lost weight, going from a 46-inch waist to a 40-inch waist.  HTN/CAD: He manages hypertension with amlodipine  10 mg and lisinopril  30 mg and hydrochlorothiazide  25 mg daily. His blood pressure is reported as good. He takes rosuvastatin  40 mg  and with aspirin  for hx of CAD.   No current chest pain or shortness of breath.    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   PAD (peripheral artery disease) 02/09/2021   Former smoker 08/26/2020   Influenza vaccine needed 03/13/2020   Insomnia 03/13/2020   Primary osteoarthritis of both shoulders 07/24/2019   Morbid obesity (HCC) 07/24/2019  Chronic pain of both shoulders 03/19/2019   Hyperlipidemia 12/16/2017   Other male erectile dysfunction 10/04/2013   Colon cancer screening 10/04/2013   Diabetes (HCC) 12/22/2012   Essential hypertension 12/22/2012   Essential hypertension, benign 12/22/2012   Type II or unspecified type diabetes mellitus without mention of complication, uncontrolled 10/17/2012     Medications Ordered Prior to Encounter[1]  Allergies[2]  Social History   Socioeconomic History   Marital status: Single    Spouse name: Not on file   Number of children: Not on file   Years of education: Not on  file   Highest education level: Not on file  Occupational History   Not on file  Tobacco Use   Smoking status: Former    Current packs/day: 0.00    Average packs/day: 2.0 packs/day    Types: Cigarettes    Quit date: 04/2020    Years since quitting: 4.0   Smokeless tobacco: Current  Vaping Use   Vaping status: Some Days  Substance and Sexual Activity   Alcohol use: Never   Drug use: Yes    Types: Marijuana   Sexual activity: Yes  Other Topics Concern   Not on file  Social History Narrative   ** Merged History Encounter **       Social Drivers of Health   Tobacco Use: High Risk (04/27/2024)   Patient History    Smoking Tobacco Use: Former    Smokeless Tobacco Use: Current    Passive Exposure: Not on Actuary Strain: Low Risk (06/25/2022)   Overall Financial Resource Strain (CARDIA)    Difficulty of Paying Living Expenses: Not hard at all  Food Insecurity: No Food Insecurity (06/25/2022)   Hunger Vital Sign    Worried About Running Out of Food in the Last Year: Never true    Ran Out of Food in the Last Year: Never true  Transportation Needs: No Transportation Needs (06/25/2022)   PRAPARE - Administrator, Civil Service (Medical): No    Lack of Transportation (Non-Medical): No  Physical Activity: Insufficiently Active (06/25/2022)   Exercise Vital Sign    Days of Exercise per Week: 2 days    Minutes of Exercise per Session: 30 min  Stress: No Stress Concern Present (06/25/2022)   [employer] of Occupational Health - Occupational Stress Questionnaire    Feeling of Stress : Not at all  Social Connections: Moderately Integrated (06/25/2022)   Social Connection and Isolation Panel    Frequency of Communication with Friends and Family: More than three times a week    Frequency of Social Gatherings with Friends and Family: Once a week    Attends Religious Services: More than 4 times per year    Active Member of [employer] or Organizations: No     Attends Banker Meetings: Never    Marital Status: Living with partner  Intimate Partner Violence: Not At Risk (06/25/2022)   Humiliation, Afraid, Rape, and Kick questionnaire    Fear of Current or Ex-Partner: No    Emotionally Abused: No    Physically Abused: No    Sexually Abused: No  Depression (PHQ2-9): Low Risk (09/19/2023)   Depression (PHQ2-9)    PHQ-2 Score: 0  Alcohol Screen: Low Risk (06/25/2022)   Alcohol Screen    Last Alcohol Screening Score (AUDIT): 0  Housing: Low Risk (06/25/2022)   Housing    Last Housing Risk Score: 0  Utilities: Not At Risk (06/25/2022)   AHC Utilities  Threatened with loss of utilities: No  Health Literacy: Not on file    Family History  Problem Relation Age of Onset   Diabetes Mother    Heart disease Mother    Heart disease Father     Past Surgical History:  Procedure Laterality Date   CORONARY PRESSURE/FFR STUDY N/A 12/17/2021   Procedure: INTRAVASCULAR PRESSURE WIRE/FFR STUDY;  Surgeon: Verlin Lonni BIRCH, MD;  Location: [facility];  Service: Cardiovascular;  Laterality: N/A;   CORONARY STENT INTERVENTION N/A 12/17/2021   Procedure: CORONARY STENT INTERVENTION;  Surgeon: Verlin Lonni BIRCH, MD;  Location: [facility];  Service: Cardiovascular;  Laterality: N/A;   LEFT HEART CATH AND CORONARY ANGIOGRAPHY N/A 12/17/2021   Procedure: LEFT HEART CATH AND CORONARY ANGIOGRAPHY;  Surgeon: Verlin Lonni BIRCH, MD;  Location: [facility];  Service: Cardiovascular;  Laterality: N/A;    ROS: Review of Systems Negative except as stated above  PHYSICAL EXAM: BP 121/69 (BP Location: Left Arm, Patient Position: Sitting, Cuff Size: Large)   Pulse (!) 56   Temp 97.8 F (36.6 C) (Oral)   Ht 5' 11 (1.803 m)   Wt 279 lb (126.6 kg)   SpO2 92%   BMI 38.91 kg/m   Wt Readings from Last 3 Encounters:  05/10/24 279 lb (126.6 kg)  09/19/23 287 lb 3.2 oz (130.3 kg)  05/25/23 297 lb 1.9 oz (134.8 kg)     Physical Exam   General appearance - alert, well appearing, and in no distress Mental status - normal mood, behavior, speech, dress, motor activity, and thought processes Neck - supple, no significant adenopathy Chest - clear to auscultation, no wheezes, rales or rhonchi, symmetric air entry Heart - normal rate, regular rhythm, normal S1, S2, no murmurs, rubs, clicks or gallops Extremities - no LE edema     Latest Ref Rng & Units 05/16/2023    3:19 PM 06/25/2022   11:30 AM 01/19/2022   10:30 AM  CMP  Glucose 70 - 99 mg/dL 97  846  838   BUN 8 - 27 mg/dL 18  15  16    Creatinine 0.76 - 1.27 mg/dL 8.97  9.13  8.85   Sodium 134 - 144 mmol/L 139  137  137   Potassium 3.5 - 5.2 mmol/L 4.6  4.2  4.6   Chloride 96 - 106 mmol/L 101  100  98   CO2 20 - 29 mmol/L 22  23  25    Calcium  8.6 - 10.2 mg/dL 9.7  9.2  9.7   Total Protein 6.0 - 8.5 g/dL 6.8   6.8   Total Bilirubin 0.0 - 1.2 mg/dL 0.2   0.3   Alkaline Phos 44 - 121 IU/L 81   87   AST 0 - 40 IU/L 18   17   ALT 0 - 44 IU/L 21   19    Lipid Panel     Component Value Date/Time   CHOL 122 05/16/2023 1519   TRIG 162 (H) 05/16/2023 1519   HDL 43 05/16/2023 1519   CHOLHDL 2.8 05/16/2023 1519   CHOLHDL 4.1 09/16/2014 1744   VLDL 22 09/16/2014 1744   LDLCALC 52 05/16/2023 1519    CBC    Component Value Date/Time   WBC 13.1 (H) 05/16/2023 1519   WBC 7.9 06/06/2013 1156   RBC 5.43 05/16/2023 1519   RBC 5.71 06/06/2013 1156   HGB 15.4 05/16/2023 1519   HCT 47.7 05/16/2023 1519   PLT 276 05/16/2023  1519   MCV 88 05/16/2023 1519   MCH 28.4 05/16/2023 1519   MCH 29.1 06/06/2013 1156   MCHC 32.3 05/16/2023 1519   MCHC 34.6 06/06/2013 1156   RDW 14.8 05/16/2023 1519   LYMPHSABS 2.4 06/06/2013 1156   MONOABS 0.6 06/06/2013 1156   EOSABS 0.2 06/06/2013 1156   BASOSABS 0.0 06/06/2013 1156    ASSESSMENT AND PLAN: 1. Type 2 diabetes mellitus with morbid obesity (HCC) (Primary) A1c at goal.  However patient would like to  achieve further weight loss and he feels that his weight has plateaued on the maximum dose of Ozempic . We discussed changing him to Mounjaro  if covered by his insurance.  Advised that Mounjaro  and studies brought about greater weight loss than Ozempic  with fewer gastrointestinal side effects.  However can still cause some of the same side effects like nausea, vomiting, diarrhea, abdominal pain like Ozempic .  Patient has tolerated maximum dose of Ozempic  well.  He is willing to give the Mounjaro  a try instead.  Advised that we will start him on the second highest dose of the Mounjaro  which is the 5 mg once a week.  Stop the Ozempic .  Continue Farxiga  and metformin . -After being on Mounjaro  5 mg for 1 month, he should send me a MyChart message to let me know that he is tolerating the medicine and ready to go to the next dose.  We will titrate up to the highest tolerable dose but I hope to get him to 15 mg once a week eventually. -Continue healthy eating habits. -Patient has gym membership and plans to get back in the gym once the cold snap that we are in is over - POCT glucose (manual entry) - POCT glycosylated hemoglobin (Hb A1C) - tirzepatide  (MOUNJARO ) 5 MG/0.5ML Pen; Inject 5 mg into the skin once a week.  Dispense: 2 mL; Refill: 0 - CBC; Future - Comprehensive metabolic panel with GFR; Future - Lipid panel; Future - Microalbumin / creatinine urine ratio; Future  2. Hypertension associated with type 2 diabetes mellitus (HCC) At goal.  Continue amlodipine , lisinopril  and hydrochlorothiazide . - amLODipine  (NORVASC ) 10 MG tablet; Take 1 tablet (10 mg total) by mouth daily.  Dispense: 90 tablet; Refill: 3 - hydrochlorothiazide  (HYDRODIURIL ) 25 MG tablet; Take 1 tablet (25 mg total) by mouth daily.  Dispense: 90 tablet; Refill: 1 - lisinopril  (ZESTRIL ) 30 MG tablet; Take 1 tablet (30 mg total) by mouth daily.  Dispense: 90 tablet; Refill: 3  3. Coronary artery disease involving native coronary artery  of native heart without angina pectoris Clinically stable.  Continue aspirin  and rosuvastatin .  Not on beta-blocker.  He is bradycardic.  Patient was given the opportunity to ask questions.  Patient verbalized understanding of the plan and was able to repeat key elements of the plan.   This documentation was completed using Paediatric nurse.  Any transcriptional errors are unintentional.  Orders Placed This Encounter  Procedures   CBC   Comprehensive metabolic panel with GFR   Lipid panel   Microalbumin / creatinine urine ratio   POCT glucose (manual entry)   POCT glycosylated hemoglobin (Hb A1C)     Requested Prescriptions   Signed Prescriptions Disp Refills   amLODipine  (NORVASC ) 10 MG tablet 90 tablet 3    Sig: Take 1 tablet (10 mg total) by mouth daily.   hydrochlorothiazide  (HYDRODIURIL ) 25 MG tablet 90 tablet 1    Sig: Take 1 tablet (25 mg total) by mouth daily.   lisinopril  (ZESTRIL ) 30  MG tablet 90 tablet 3    Sig: Take 1 tablet (30 mg total) by mouth daily.   tirzepatide  (MOUNJARO ) 5 MG/0.5ML Pen 2 mL 0    Sig: Inject 5 mg into the skin once a week.    Return in about 4 months (around 09/07/2024) for Give lab appt for next week.  Barnie Louder, MD, FACP    [1]  Current Outpatient Medications on File Prior to Visit  Medication Sig Dispense Refill   albuterol  (VENTOLIN  HFA) 108 (90 Base) MCG/ACT inhaler TAKE 2 PUFFS BY MOUTH EVERY 6 HOURS AS NEEDED FOR WHEEZE OR SHORTNESS OF BREATH 8.5 each 2   aspirin  EC 81 MG tablet Take 1 tablet (81 mg total) by mouth daily. 30 tablet    Continuous Glucose Sensor (FREESTYLE LIBRE 3 PLUS SENSOR) MISC USE AS DIRECTED AND CHANGE SENSOR EVERY 15 DAYS 2 each 3   dapagliflozin  propanediol (FARXIGA ) 10 MG TABS tablet TAKE 1 TABLET EVERY DAY BEFORE BREAKFAST 90 tablet 0   metFORMIN  (GLUCOPHAGE -XR) 500 MG 24 hr tablet TAKE 1 TABLET EVERY DAY WITH BREAKFAST 90 tablet 3   nitroGLYCERIN  (NITROSTAT ) 0.4 MG SL tablet Place  0.4 mg under the tongue every 5 (five) minutes as needed for chest pain.     rosuvastatin  (CRESTOR ) 40 MG tablet TAKE 1 TABLET EVERY DAY 90 tablet 3   sildenafil  (VIAGRA ) 100 MG tablet TAKE 1/2 TO 1 TAB BY MOUTH 1 HOUR PRIOR TO INTERCOURSE AS NEEDED 10 tablet 1   traZODone  (DESYREL ) 50 MG tablet TAKE 0.5-1 TABLETS (25-50 MG TOTAL) BY MOUTH AT BEDTIME AS NEEDED FOR SLEEP. 30 tablet 2   No current facility-administered medications on file prior to visit.  [2]  Allergies Allergen Reactions   Shellfish Allergy Swelling   "

## 2024-05-11 ENCOUNTER — Other Ambulatory Visit: Payer: Self-pay

## 2024-05-14 ENCOUNTER — Other Ambulatory Visit: Payer: Self-pay | Admitting: Internal Medicine

## 2024-05-14 ENCOUNTER — Telehealth: Payer: Self-pay | Admitting: Pharmacist

## 2024-05-14 ENCOUNTER — Other Ambulatory Visit: Payer: Self-pay

## 2024-05-14 DIAGNOSIS — E1169 Type 2 diabetes mellitus with other specified complication: Secondary | ICD-10-CM

## 2024-05-15 ENCOUNTER — Ambulatory Visit

## 2024-05-15 ENCOUNTER — Other Ambulatory Visit: Payer: Self-pay

## 2024-05-15 DIAGNOSIS — E1169 Type 2 diabetes mellitus with other specified complication: Secondary | ICD-10-CM

## 2024-05-16 ENCOUNTER — Telehealth: Payer: Self-pay

## 2024-05-16 ENCOUNTER — Ambulatory Visit: Payer: Self-pay | Admitting: Internal Medicine

## 2024-05-16 LAB — CBC
Hematocrit: 47.8 % (ref 37.5–51.0)
Hemoglobin: 15.4 g/dL (ref 13.0–17.7)
MCH: 28.4 pg (ref 26.6–33.0)
MCHC: 32.2 g/dL (ref 31.5–35.7)
MCV: 88 fL (ref 79–97)
Platelets: 231 10*3/uL (ref 150–450)
RBC: 5.42 x10E6/uL (ref 4.14–5.80)
RDW: 14.2 % (ref 11.6–15.4)
WBC: 7.2 10*3/uL (ref 3.4–10.8)

## 2024-05-16 LAB — LIPID PANEL
Chol/HDL Ratio: 2.6 ratio (ref 0.0–5.0)
Cholesterol, Total: 118 mg/dL (ref 100–199)
HDL: 46 mg/dL
LDL Chol Calc (NIH): 54 mg/dL (ref 0–99)
Triglycerides: 91 mg/dL (ref 0–149)
VLDL Cholesterol Cal: 18 mg/dL (ref 5–40)

## 2024-05-16 LAB — COMPREHENSIVE METABOLIC PANEL WITH GFR
ALT: 24 [IU]/L (ref 0–44)
AST: 20 [IU]/L (ref 0–40)
Albumin: 4.7 g/dL (ref 3.9–4.9)
Alkaline Phosphatase: 79 [IU]/L (ref 47–123)
BUN/Creatinine Ratio: 18 (ref 10–24)
BUN: 16 mg/dL (ref 8–27)
Bilirubin Total: 0.2 mg/dL (ref 0.0–1.2)
CO2: 27 mmol/L (ref 20–29)
Calcium: 9.6 mg/dL (ref 8.6–10.2)
Chloride: 99 mmol/L (ref 96–106)
Creatinine, Ser: 0.91 mg/dL (ref 0.76–1.27)
Globulin, Total: 2.3 g/dL (ref 1.5–4.5)
Glucose: 93 mg/dL (ref 70–99)
Potassium: 4.6 mmol/L (ref 3.5–5.2)
Sodium: 139 mmol/L (ref 134–144)
Total Protein: 7 g/dL (ref 6.0–8.5)
eGFR: 93 mL/min/{1.73_m2}

## 2024-05-16 LAB — MICROALBUMIN / CREATININE URINE RATIO
Creatinine, Urine: 61 mg/dL
Microalb/Creat Ratio: 19 mg/g{creat} (ref 0–29)
Microalbumin, Urine: 11.5 ug/mL

## 2024-05-16 NOTE — Telephone Encounter (Signed)
 Pharmacy Patient Advocate Encounter  Received notification from [employer] that Prior Authorization for MOUNJARO  has been APPROVED from 05/14/2024 to 04/11/2025

## 2024-07-26 ENCOUNTER — Ambulatory Visit: Admitting: Sports Medicine

## 2024-09-07 ENCOUNTER — Ambulatory Visit: Payer: Self-pay | Admitting: Internal Medicine
